# Patient Record
Sex: Female | Born: 1970 | Hispanic: No | Smoking: Former smoker
Health system: Southern US, Community
[De-identification: ages and names within clinical notes are randomized; demographics above are authoritative.]

## PROBLEM LIST (undated history)

## (undated) DIAGNOSIS — IMO0002 Reserved for concepts with insufficient information to code with codable children: Principal | ICD-10-CM

## (undated) DIAGNOSIS — F419 Anxiety disorder, unspecified: Secondary | ICD-10-CM

## (undated) DIAGNOSIS — D649 Anemia, unspecified: Secondary | ICD-10-CM

## (undated) DIAGNOSIS — M797 Fibromyalgia: Secondary | ICD-10-CM

## (undated) DIAGNOSIS — E039 Hypothyroidism, unspecified: Secondary | ICD-10-CM

## (undated) DIAGNOSIS — K589 Irritable bowel syndrome without diarrhea: Secondary | ICD-10-CM

## (undated) DIAGNOSIS — R002 Palpitations: Secondary | ICD-10-CM

## (undated) DIAGNOSIS — R7303 Prediabetes: Secondary | ICD-10-CM

## (undated) DIAGNOSIS — M1990 Unspecified osteoarthritis, unspecified site: Secondary | ICD-10-CM

## (undated) DIAGNOSIS — J40 Bronchitis, not specified as acute or chronic: Secondary | ICD-10-CM

## (undated) DIAGNOSIS — I10 Essential (primary) hypertension: Secondary | ICD-10-CM

## (undated) DIAGNOSIS — K219 Gastro-esophageal reflux disease without esophagitis: Secondary | ICD-10-CM

## (undated) DIAGNOSIS — F32A Depression, unspecified: Secondary | ICD-10-CM

## (undated) DIAGNOSIS — G8929 Other chronic pain: Secondary | ICD-10-CM

## (undated) DIAGNOSIS — G43909 Migraine, unspecified, not intractable, without status migrainosus: Secondary | ICD-10-CM

## (undated) DIAGNOSIS — G4730 Sleep apnea, unspecified: Secondary | ICD-10-CM

## (undated) DIAGNOSIS — I499 Cardiac arrhythmia, unspecified: Secondary | ICD-10-CM

## (undated) DIAGNOSIS — M549 Dorsalgia, unspecified: Secondary | ICD-10-CM

## (undated) DIAGNOSIS — M792 Neuralgia and neuritis, unspecified: Secondary | ICD-10-CM

## (undated) HISTORY — DX: Reserved for concepts with insufficient information to code with codable children: IMO0002

## (undated) HISTORY — PX: DILATION AND CURETTAGE OF UTERUS: SHX78

## (undated) HISTORY — PX: OTHER SURGICAL HISTORY: SHX169

## (undated) HISTORY — DX: Palpitations: R00.2

## (undated) HISTORY — PX: ENDOMETRIAL ABLATION: SHX621

---

## 1991-08-05 HISTORY — PX: TUBAL LIGATION: SHX77

## 2007-05-29 ENCOUNTER — Emergency Department (HOSPITAL_COMMUNITY): Admission: EM | Admit: 2007-05-29 | Discharge: 2007-05-29 | Payer: Self-pay | Admitting: Emergency Medicine

## 2009-01-24 ENCOUNTER — Emergency Department (HOSPITAL_COMMUNITY): Admission: EM | Admit: 2009-01-24 | Discharge: 2009-01-24 | Payer: Self-pay | Admitting: Emergency Medicine

## 2009-04-10 ENCOUNTER — Emergency Department (HOSPITAL_COMMUNITY): Admission: EM | Admit: 2009-04-10 | Discharge: 2009-04-10 | Payer: Self-pay | Admitting: Emergency Medicine

## 2009-05-05 ENCOUNTER — Emergency Department (HOSPITAL_COMMUNITY): Admission: EM | Admit: 2009-05-05 | Discharge: 2009-05-05 | Payer: Self-pay | Admitting: Emergency Medicine

## 2009-10-26 ENCOUNTER — Emergency Department (HOSPITAL_COMMUNITY): Admission: EM | Admit: 2009-10-26 | Discharge: 2009-10-27 | Payer: Self-pay | Admitting: Emergency Medicine

## 2010-05-09 ENCOUNTER — Encounter: Admission: RE | Admit: 2010-05-09 | Discharge: 2010-05-09 | Payer: Self-pay | Admitting: Family Medicine

## 2010-06-05 ENCOUNTER — Other Ambulatory Visit
Admission: RE | Admit: 2010-06-05 | Discharge: 2010-06-05 | Payer: Self-pay | Source: Home / Self Care | Admitting: Obstetrics & Gynecology

## 2010-06-05 ENCOUNTER — Ambulatory Visit: Payer: Self-pay | Admitting: Obstetrics and Gynecology

## 2010-07-04 ENCOUNTER — Ambulatory Visit: Payer: Self-pay | Admitting: Obstetrics and Gynecology

## 2010-08-24 ENCOUNTER — Encounter: Payer: Self-pay | Admitting: Internal Medicine

## 2010-09-26 ENCOUNTER — Other Ambulatory Visit (HOSPITAL_COMMUNITY): Payer: Self-pay | Admitting: Internal Medicine

## 2010-09-26 ENCOUNTER — Ambulatory Visit (HOSPITAL_COMMUNITY)
Admission: RE | Admit: 2010-09-26 | Discharge: 2010-09-26 | Disposition: A | Discharge status: Home / Self Care | Payer: Self-pay | Source: Ambulatory Visit | Attending: Internal Medicine | Admitting: Internal Medicine

## 2010-09-26 DIAGNOSIS — M79609 Pain in unspecified limb: Secondary | ICD-10-CM | POA: Insufficient documentation

## 2010-09-26 DIAGNOSIS — M79673 Pain in unspecified foot: Secondary | ICD-10-CM

## 2010-10-15 LAB — POCT PREGNANCY, URINE: Preg Test, Ur: NEGATIVE

## 2010-10-16 ENCOUNTER — Other Ambulatory Visit (HOSPITAL_COMMUNITY)
Admission: RE | Admit: 2010-10-16 | Discharge: 2010-10-16 | Disposition: A | Discharge status: Home / Self Care | Payer: Self-pay | Source: Ambulatory Visit | Attending: Family Medicine | Admitting: Family Medicine

## 2010-10-16 ENCOUNTER — Other Ambulatory Visit: Payer: Self-pay | Admitting: Physician Assistant

## 2010-10-16 ENCOUNTER — Encounter: Payer: Self-pay | Admitting: Physician Assistant

## 2010-10-16 ENCOUNTER — Other Ambulatory Visit: Payer: Self-pay | Admitting: Family Medicine

## 2010-10-16 ENCOUNTER — Ambulatory Visit (INDEPENDENT_AMBULATORY_CARE_PROVIDER_SITE_OTHER): Payer: Self-pay | Admitting: Family Medicine

## 2010-10-16 DIAGNOSIS — N938 Other specified abnormal uterine and vaginal bleeding: Secondary | ICD-10-CM

## 2010-10-16 DIAGNOSIS — R58 Hemorrhage, not elsewhere classified: Secondary | ICD-10-CM

## 2010-10-16 DIAGNOSIS — R102 Pelvic and perineal pain: Secondary | ICD-10-CM

## 2010-10-16 DIAGNOSIS — D219 Benign neoplasm of connective and other soft tissue, unspecified: Secondary | ICD-10-CM

## 2010-10-16 DIAGNOSIS — N949 Unspecified condition associated with female genital organs and menstrual cycle: Secondary | ICD-10-CM

## 2010-10-16 LAB — CONVERTED CEMR LAB
HCT: 36.4 % (ref 36.0–46.0)
MCHC: 31.3 g/dL (ref 30.0–36.0)
MCV: 80 fL (ref 78.0–100.0)
Platelets: 424 10*3/uL — ABNORMAL HIGH (ref 150–400)
TSH: 3.447 microintl units/mL (ref 0.350–4.500)
Vit D, 25-Hydroxy: 15 ng/mL — ABNORMAL LOW (ref 30–89)

## 2010-10-16 LAB — POCT PREGNANCY, URINE: Preg Test, Ur: NEGATIVE

## 2010-10-22 ENCOUNTER — Other Ambulatory Visit: Payer: Self-pay | Admitting: Physician Assistant

## 2010-10-22 ENCOUNTER — Ambulatory Visit (HOSPITAL_COMMUNITY)
Admission: RE | Admit: 2010-10-22 | Discharge: 2010-10-22 | Disposition: A | Discharge status: Home / Self Care | Payer: Self-pay | Source: Ambulatory Visit | Attending: Physician Assistant | Admitting: Physician Assistant

## 2010-10-22 DIAGNOSIS — N938 Other specified abnormal uterine and vaginal bleeding: Secondary | ICD-10-CM | POA: Insufficient documentation

## 2010-10-22 DIAGNOSIS — R102 Pelvic and perineal pain: Secondary | ICD-10-CM

## 2010-10-22 DIAGNOSIS — D219 Benign neoplasm of connective and other soft tissue, unspecified: Secondary | ICD-10-CM

## 2010-10-22 DIAGNOSIS — D259 Leiomyoma of uterus, unspecified: Secondary | ICD-10-CM | POA: Insufficient documentation

## 2010-10-22 DIAGNOSIS — R58 Hemorrhage, not elsewhere classified: Secondary | ICD-10-CM

## 2010-10-22 DIAGNOSIS — N949 Unspecified condition associated with female genital organs and menstrual cycle: Secondary | ICD-10-CM | POA: Insufficient documentation
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
## 2010-10-28 LAB — BASIC METABOLIC PANEL
BUN: 8 mg/dL (ref 6–23)
CO2: 25 mEq/L (ref 19–32)
Chloride: 106 mEq/L (ref 96–112)
Creatinine, Ser: 0.64 mg/dL (ref 0.4–1.2)
Potassium: 3.8 mEq/L (ref 3.5–5.1)

## 2010-10-28 LAB — CBC
HCT: 33.4 % — ABNORMAL LOW (ref 36.0–46.0)
MCHC: 32.8 g/dL (ref 30.0–36.0)
MCV: 78.3 fL (ref 78.0–100.0)
Platelets: 319 10*3/uL (ref 150–400)
RBC: 4.27 MIL/uL (ref 3.87–5.11)
WBC: 9.6 10*3/uL (ref 4.0–10.5)

## 2010-10-28 LAB — DIFFERENTIAL
Basophils Relative: 1 % (ref 0–1)
Eosinophils Absolute: 0.2 10*3/uL (ref 0.0–0.7)
Eosinophils Relative: 2 % (ref 0–5)
Lymphs Abs: 3.7 10*3/uL (ref 0.7–4.0)
Monocytes Relative: 7 % (ref 3–12)
Neutrophils Relative %: 51 % (ref 43–77)

## 2010-10-30 ENCOUNTER — Other Ambulatory Visit: Payer: Self-pay | Admitting: Family Medicine

## 2010-10-30 ENCOUNTER — Ambulatory Visit (INDEPENDENT_AMBULATORY_CARE_PROVIDER_SITE_OTHER): Payer: Self-pay | Admitting: Family Medicine

## 2010-10-30 DIAGNOSIS — D219 Benign neoplasm of connective and other soft tissue, unspecified: Secondary | ICD-10-CM

## 2010-10-30 DIAGNOSIS — N938 Other specified abnormal uterine and vaginal bleeding: Secondary | ICD-10-CM

## 2010-10-30 DIAGNOSIS — N949 Unspecified condition associated with female genital organs and menstrual cycle: Secondary | ICD-10-CM

## 2010-10-30 DIAGNOSIS — R9389 Abnormal findings on diagnostic imaging of other specified body structures: Secondary | ICD-10-CM

## 2010-10-31 NOTE — Progress Notes (Signed)
NAMEOTTO, CARAWAY               ACCOUNT NO.:  [id number]  MEDICAL RECORD NO.:  [id number]           PATIENT TYPE:  A  LOCATION[facility]                   [facility]ACILITY:  [facility]  PHYSICIAN:  Lucina Mellow, DO   DATE OF BIRTH:  June 27, 1971  DATE OF SERVICE:  10/30/2010                                 CLINIC NOTE  The patient was seen in the [facility] the afternoon of October 30, 2010.  The patient presents for her results of her endometrial biopsy, other labs, and her ultrasound.  HISTORY OF PRESENT ILLNESS:  The patient is a 39-year gravida 4, para 2- 0-1-2 who presented with longstanding complaints of heavy painful periods on October 16, 2010.  The patient was seen by certified nurse midwife, Maylon Cos.  At that time, the patient had endometrial biopsy and was scheduled to have an ultrasound to evaluate her uterus for her complaint of heavy thick periods and pelvic pain, which is worse with cycling.  She states that she had a period that started a couple days after her ultrasound and it was heavy, not heavier than usual, but about approximate how it normally is and states that her pain was about as it was as well.  The patient also had a number of blood work done at the time of her exam with Mr. Dorcas Carrow and she has already been notified that her vitamin D was deficient and I started her 50,000 units weekly vitamin D replacement.  The patient states she has not been taking calcium with that.  She has no acute concerns or complaints today.  She states that she is interested in doing something surgical at this time for her symptoms because she has been struggling with this since the late 1990s after the birth of her youngest child and is ready to do something because the pain and symptoms are just becoming too much.  PAST SURGICAL HISTORY:  Negative.  PERSONAL MEDICAL HISTORY:  Arthritis, high blood pressure, thyroid disease, and hepatitis B.  She is managed by  [facility].  No history of blood transfusions.  Her last Pap was in 2011 and had a colpo that showed LSIL and she is due for repeat Pap in June of this year. She has no known history of sexually transmitted diseases.  LAB RESULTS:  Her endometrial biopsy showed benign secretory endometrium with no hyperplasia or malignancy identified.  Her TSH was 3.447, FSH was 4.5, LH is 3.0, vitamin D was 15, GC and Chlamydia were negative. Her CBC showed hemoglobin of 11.4 and platelets of 424.  Her ultrasound showed an endometrial stripe was heterogeneous and thickened and measured approximately 20 mm in width with a posterior myometrial fibroids that measured 1.7 cm.  Both ovaries were normal in size and appearance.  ASSESSMENT:  Pelvic plan, dysfunction uterine bleeding, and vitamin D deficiency.  PLAN:  The patient was notified of these results and the recommendation to repeat her ultrasound in 6 to 8 weeks from the time of the exam, which is now 4 to 6 weeks from today.  She will get a repeat ultrasound and then will follow up with one  of our surgeons to discuss what her options are for her treatment of her symptoms, notably pain and heavy menstrual periods.  She currently is not anemic.  For her vitamin D deficiency, we discussed that she is taking the 50,000 units weekly of the vitamin D replacement but is also warranted for her to take a calcium supplement.  We discussed that she can just buy an over-the- counter supplement and take between 1500 and 2000 mg of calcium a day and this can be with or without vitamin D because even with the 50,000 units weekly vitamin D, she is not going to adversely affect that.  The patient voiced understanding of that plan.  Her questions were all answered and she will follow up in the clinic after her ultrasound is performed in 4 to 6 weeks and speak with the surgeon about her surgical options at that point, but otherwise, is to call the clinic  if she has a problem in the interim.          ______________________________ Lucina Mellow, DO    SH/MEDQ  D:  10/30/2010  T:  10/31/2010  Job:  [id number]

## 2010-11-07 LAB — URINE MICROSCOPIC-ADD ON

## 2010-11-07 LAB — URINE CULTURE: Colony Count: >=100000

## 2010-11-07 LAB — CBC
MCHC: 32.4 g/dL (ref 30.0–36.0)
MCV: 79.3 fL (ref 78.0–100.0)
RBC: 4.55 MIL/uL (ref 3.87–5.11)
RDW: 13.7 % (ref 11.5–15.5)

## 2010-11-07 LAB — DIFFERENTIAL
Basophils Absolute: 0 10*3/uL (ref 0.0–0.1)
Basophils Relative: 1 % (ref 0–1)
Eosinophils Absolute: 0.2 10*3/uL (ref 0.0–0.7)
Monocytes Relative: 9 % (ref 3–12)
Neutrophils Relative %: 64 % (ref 43–77)

## 2010-11-07 LAB — URINALYSIS, ROUTINE W REFLEX MICROSCOPIC
Glucose, UA: NEGATIVE mg/dL
Ketones, ur: NEGATIVE mg/dL
Protein, ur: 30 mg/dL — AB
Urobilinogen, UA: 1 mg/dL (ref 0.0–1.0)

## 2010-11-07 LAB — BASIC METABOLIC PANEL
BUN: 6 mg/dL (ref 6–23)
CO2: 25 mEq/L (ref 19–32)
Calcium: 9.2 mg/dL (ref 8.4–10.5)
Chloride: 104 mEq/L (ref 96–112)
Creatinine, Ser: 0.75 mg/dL (ref 0.4–1.2)
GFR calc Af Amer: >60 mL/min (ref >60)
Glucose, Bld: 94 mg/dL (ref 70–99)

## 2010-11-26 ENCOUNTER — Ambulatory Visit (HOSPITAL_COMMUNITY)
Admission: RE | Admit: 2010-11-26 | Discharge: 2010-11-26 | Disposition: A | Discharge status: Home / Self Care | Payer: Self-pay | Source: Ambulatory Visit | Attending: Family Medicine | Admitting: Family Medicine

## 2010-11-26 DIAGNOSIS — D259 Leiomyoma of uterus, unspecified: Secondary | ICD-10-CM | POA: Insufficient documentation

## 2010-11-26 DIAGNOSIS — D219 Benign neoplasm of connective and other soft tissue, unspecified: Secondary | ICD-10-CM

## 2010-11-26 DIAGNOSIS — N920 Excessive and frequent menstruation with regular cycle: Secondary | ICD-10-CM | POA: Insufficient documentation

## 2010-11-26 DIAGNOSIS — R9389 Abnormal findings on diagnostic imaging of other specified body structures: Secondary | ICD-10-CM

## 2010-11-27 ENCOUNTER — Emergency Department (HOSPITAL_COMMUNITY)
Admission: EM | Admit: 2010-11-27 | Discharge: 2010-11-27 | Disposition: A | Discharge status: Home / Self Care | Payer: Self-pay | Attending: Emergency Medicine | Admitting: Emergency Medicine

## 2010-11-27 ENCOUNTER — Emergency Department (HOSPITAL_COMMUNITY): Payer: Self-pay

## 2010-11-27 DIAGNOSIS — M7989 Other specified soft tissue disorders: Secondary | ICD-10-CM | POA: Insufficient documentation

## 2010-11-27 DIAGNOSIS — M79609 Pain in unspecified limb: Secondary | ICD-10-CM | POA: Insufficient documentation

## 2010-11-27 DIAGNOSIS — E039 Hypothyroidism, unspecified: Secondary | ICD-10-CM | POA: Insufficient documentation

## 2010-11-27 DIAGNOSIS — M722 Plantar fascial fibromatosis: Secondary | ICD-10-CM | POA: Insufficient documentation

## 2010-11-27 DIAGNOSIS — Z79899 Other long term (current) drug therapy: Secondary | ICD-10-CM | POA: Insufficient documentation

## 2010-12-04 ENCOUNTER — Ambulatory Visit: Payer: Self-pay | Admitting: Family Medicine

## 2010-12-18 ENCOUNTER — Ambulatory Visit: Payer: Self-pay | Admitting: Physician Assistant

## 2011-01-03 ENCOUNTER — Ambulatory Visit: Payer: Self-pay | Admitting: Obstetrics and Gynecology

## 2011-01-09 ENCOUNTER — Ambulatory Visit (INDEPENDENT_AMBULATORY_CARE_PROVIDER_SITE_OTHER): Payer: Self-pay | Admitting: Obstetrics & Gynecology

## 2011-01-09 ENCOUNTER — Other Ambulatory Visit: Payer: Self-pay | Admitting: Obstetrics & Gynecology

## 2011-01-09 DIAGNOSIS — N938 Other specified abnormal uterine and vaginal bleeding: Secondary | ICD-10-CM

## 2011-01-09 DIAGNOSIS — N949 Unspecified condition associated with female genital organs and menstrual cycle: Secondary | ICD-10-CM

## 2011-01-09 DIAGNOSIS — E669 Obesity, unspecified: Secondary | ICD-10-CM

## 2011-01-09 DIAGNOSIS — R87612 Low grade squamous intraepithelial lesion on cytologic smear of cervix (LGSIL): Secondary | ICD-10-CM

## 2011-01-10 NOTE — Group Therapy Note (Signed)
Gloria Hall, RANN NO.:  [id number]  MEDICAL RECORD NO.:  [id number]           PATIENT TYPE:  A  LOCATION[facility]                   [facility]ACILITY[facility]  [facility]  PHYSICIAN:  Allie Bossier, MD        DATE OF BIRTH:  April 17, 1971  DATE OF SERVICE:                                 CLINIC NOTE  IDENTIFICATION:  Gloria Hall is a 40 year old married, G4, P3, A1.  She has 70 year old son and 75 and 3 year old daughters.  She comes here after seeing Dr. Natale Milch in March 2012.  At that time, she had been complaining of heavy periods but have been longstanding.  However, in last 3-6 months, she says that she has been having some intermenstrual bleeding as well as pelvic pain.  Pelvic pain is worse with her cycles but she does have pain with intercourse and she has occasional pains in her pelvis unrelated to her period.  PAST MEDICAL HISTORY:  Significant for history of hypertension, but she no longer take medicines.  She has also a history of dysfunctional uterine bleeding, pelvic pain, obesity, vitamin D deficiency, history of a low-grade Pap smear done in September 2011.  PAST SURGICAL HISTORY:  She has had a tubal ligation laparoscopically.  No latex allergies.  No drug allergies.  MEDICATIONS:  She uses Tylenol, ibuprofen, and naproxen p.r.n.  She also takes alprazolam 1 mg b.i.d. p.r.n.  Endometrial biopsy done this year showed benign secretory endometrium. Her TSH was normal.  Her FSH and LH were normal and her CBC showed a hemoglobin to be 11.4.  An ultrasound was essentially normal.  She did have 2 posterior myometrial fibroids, both of which are less than 2 cm in size.  Pelvic exam, she has relatively narrow pelvis, although she did accomplish NSVD x3.  Cervix appears normal on speculum exam.  Her bimanual exam reveals uterus is midplane, somewhat tender with palpation, upper limits of normal for size, and adnexa that are nontender and nonenlarged.  ASSESSMENT AND  PLAN:  Pelvic pain, dysmenorrhea, dyspareunia, and dysfunctional uterine bleeding.  I have offered her Mirena IUD or a NovaSure endometrial ablation with the diagnostic laparoscopy at the same time.  She and her husband are going to review the literature that will be provided and she will call and let us know.  If she chooses to have this surgery, I have already placed the orders in the chart so that they can be turned in so her surgery can be done in a timely fashion.     Allie Bossier, MD    MCD/MEDQ  D:  01/09/2011  T:  01/10/2011  Job:  [id number]

## 2011-03-05 ENCOUNTER — Emergency Department (HOSPITAL_COMMUNITY)
Admission: EM | Admit: 2011-03-05 | Discharge: 2011-03-06 | Disposition: A | Discharge status: Home / Self Care | Payer: Self-pay | Attending: Emergency Medicine | Admitting: Emergency Medicine

## 2011-03-05 ENCOUNTER — Emergency Department (HOSPITAL_COMMUNITY)
Admission: EM | Admit: 2011-03-05 | Discharge: 2011-03-05 | Disposition: A | Discharge status: Home / Self Care | Payer: Self-pay | Attending: Emergency Medicine | Admitting: Emergency Medicine

## 2011-03-05 DIAGNOSIS — E039 Hypothyroidism, unspecified: Secondary | ICD-10-CM | POA: Insufficient documentation

## 2011-03-05 DIAGNOSIS — K029 Dental caries, unspecified: Secondary | ICD-10-CM | POA: Insufficient documentation

## 2011-03-05 DIAGNOSIS — K089 Disorder of teeth and supporting structures, unspecified: Secondary | ICD-10-CM | POA: Insufficient documentation

## 2011-03-05 DIAGNOSIS — K047 Periapical abscess without sinus: Secondary | ICD-10-CM | POA: Insufficient documentation

## 2011-03-05 DIAGNOSIS — M25569 Pain in unspecified knee: Secondary | ICD-10-CM | POA: Insufficient documentation

## 2011-03-10 ENCOUNTER — Other Ambulatory Visit (HOSPITAL_COMMUNITY): Payer: Self-pay

## 2011-03-11 ENCOUNTER — Encounter (HOSPITAL_COMMUNITY): Payer: Self-pay

## 2011-03-11 ENCOUNTER — Encounter (HOSPITAL_COMMUNITY)
Admission: RE | Admit: 2011-03-11 | Discharge: 2011-03-11 | Disposition: A | Discharge status: Home / Self Care | Payer: Self-pay | Source: Ambulatory Visit | Attending: Obstetrics & Gynecology | Admitting: Obstetrics & Gynecology

## 2011-03-11 HISTORY — DX: Anxiety disorder, unspecified: F41.9

## 2011-03-11 HISTORY — DX: Anemia, unspecified: D64.9

## 2011-03-11 HISTORY — DX: Essential (primary) hypertension: I10

## 2011-03-11 LAB — CBC
HCT: 34.1 % — ABNORMAL LOW (ref 36.0–46.0)
MCV: 78.8 fL (ref 78.0–100.0)
RBC: 4.33 MIL/uL (ref 3.87–5.11)
RDW: 14.6 % (ref 11.5–15.5)
WBC: 11.6 10*3/uL — ABNORMAL HIGH (ref 4.0–10.5)

## 2011-03-11 NOTE — Patient Instructions (Addendum)
19 Gloria Hall  03/11/2011   Your procedure is scheduled on:  03/19/11  Report to [facility] at 1130 AM.  Call this number if you have problems the morning of surgery: [phone]   Remember:   Do not eat food:After Midnight.  Do not drink clear liquids: 4 Hours before arrival.  Take these medicines the morning of surgery with A SIP OF WATER: NA   Do not wear jewelry, make-up or nail polish.  Do not wear lotions, powders, or perfumes. You may wear deodorant.  Do not shave 48 hours prior to surgery.  Do not bring valuables to the hospital.  Contacts, dentures or bridgework may not be worn into surgery.  Leave suitcase in the car. After surgery it may be brought to your room.  For patients admitted to the hospital, checkout time is 11:00 AM the day of discharge.   Patients discharged the day of surgery will not be allowed to drive home.  Name and phone number of your driver: husband  [name]  [phone]  Special Instructions: CHG wash per written instruction sheet   Please read over the following fact sheets that you were given: MRSA Information

## 2011-03-11 NOTE — Pre-Procedure Instructions (Signed)
Patient to have dental extractions x5 on 03/13/11 due to toothache. On oral PCN at present.

## 2011-03-19 ENCOUNTER — Encounter (HOSPITAL_COMMUNITY): Payer: Self-pay | Admitting: Anesthesiology

## 2011-03-19 ENCOUNTER — Other Ambulatory Visit: Payer: Self-pay | Admitting: Obstetrics & Gynecology

## 2011-03-19 ENCOUNTER — Ambulatory Visit (HOSPITAL_COMMUNITY)
Admission: RE | Admit: 2011-03-19 | Discharge: 2011-03-19 | Disposition: A | Discharge status: Home / Self Care | Payer: Self-pay | Source: Ambulatory Visit | Attending: Obstetrics & Gynecology | Admitting: Obstetrics & Gynecology

## 2011-03-19 ENCOUNTER — Encounter (HOSPITAL_COMMUNITY)
Admission: RE | Disposition: A | Discharge status: Home / Self Care | Payer: Self-pay | Source: Ambulatory Visit | Attending: Obstetrics & Gynecology

## 2011-03-19 ENCOUNTER — Ambulatory Visit (HOSPITAL_COMMUNITY): Payer: Self-pay | Admitting: Anesthesiology

## 2011-03-19 ENCOUNTER — Encounter (HOSPITAL_COMMUNITY): Payer: Self-pay | Admitting: *Deleted

## 2011-03-19 ENCOUNTER — Encounter (HOSPITAL_COMMUNITY): Payer: Self-pay | Admitting: Obstetrics & Gynecology

## 2011-03-19 DIAGNOSIS — Z01812 Encounter for preprocedural laboratory examination: Secondary | ICD-10-CM | POA: Insufficient documentation

## 2011-03-19 DIAGNOSIS — N80109 Endometriosis of ovary, unspecified side, unspecified depth: Secondary | ICD-10-CM | POA: Insufficient documentation

## 2011-03-19 DIAGNOSIS — N801 Endometriosis of ovary: Secondary | ICD-10-CM

## 2011-03-19 DIAGNOSIS — N938 Other specified abnormal uterine and vaginal bleeding: Secondary | ICD-10-CM | POA: Insufficient documentation

## 2011-03-19 DIAGNOSIS — N946 Dysmenorrhea, unspecified: Secondary | ICD-10-CM

## 2011-03-19 DIAGNOSIS — Z01818 Encounter for other preprocedural examination: Secondary | ICD-10-CM | POA: Insufficient documentation

## 2011-03-19 DIAGNOSIS — N803 Endometriosis of pelvic peritoneum, unspecified: Secondary | ICD-10-CM | POA: Insufficient documentation

## 2011-03-19 DIAGNOSIS — N949 Unspecified condition associated with female genital organs and menstrual cycle: Secondary | ICD-10-CM

## 2011-03-19 DIAGNOSIS — N831 Corpus luteum cyst of ovary, unspecified side: Secondary | ICD-10-CM | POA: Insufficient documentation

## 2011-03-19 HISTORY — PX: LAPAROSCOPY: SHX197

## 2011-03-19 HISTORY — PX: NOVASURE ABLATION: SHX5394

## 2011-03-19 LAB — PREGNANCY, URINE: Preg Test, Ur: NEGATIVE

## 2011-03-19 SURGERY — LAPAROSCOPY OPERATIVE
Anesthesia: General | Wound class: Clean Contaminated

## 2011-03-19 MED ORDER — KETOROLAC TROMETHAMINE 30 MG/ML IJ SOLN
INTRAMUSCULAR | Status: DC | PRN
  Administered 2011-03-19: 30 mg via INTRAVENOUS

## 2011-03-19 MED ORDER — ONDANSETRON HCL 4 MG/2ML IJ SOLN
INTRAMUSCULAR | Status: DC | PRN
  Administered 2011-03-19: 4 mg via INTRAVENOUS

## 2011-03-19 MED ORDER — DEXAMETHASONE SODIUM PHOSPHATE 4 MG/ML IJ SOLN
INTRAMUSCULAR | Status: DC | PRN
  Administered 2011-03-19: 6 mg via INTRAVENOUS

## 2011-03-19 MED ORDER — DOXYCYCLINE HYCLATE 100 MG IV SOLR
100.0000 mg | Freq: Two times a day (BID) | INTRAVENOUS | Status: DC
  Filled 2011-03-19 (×3): qty 100

## 2011-03-19 MED ORDER — LIDOCAINE HCL (CARDIAC) 20 MG/ML IV SOLN
INTRAVENOUS | Status: DC | PRN
  Administered 2011-03-19: 60 mg via INTRAVENOUS

## 2011-03-19 MED ORDER — FENTANYL CITRATE 0.05 MG/ML IJ SOLN
INTRAMUSCULAR | Status: DC | PRN
  Administered 2011-03-19: 25 ug via INTRAVENOUS
  Administered 2011-03-19: 100 ug via INTRAVENOUS

## 2011-03-19 MED ORDER — ROCURONIUM BROMIDE 50 MG/5ML IV SOLN
INTRAVENOUS | Status: AC
  Filled 2011-03-19: qty 1

## 2011-03-19 MED ORDER — MIDAZOLAM HCL 5 MG/5ML IJ SOLN
INTRAMUSCULAR | Status: DC | PRN
  Administered 2011-03-19: 2 mg via INTRAVENOUS

## 2011-03-19 MED ORDER — SUCCINYLCHOLINE CHLORIDE 20 MG/ML IJ SOLN
INTRAMUSCULAR | Status: DC | PRN
  Administered 2011-03-19: 100 mg via INTRAVENOUS

## 2011-03-19 MED ORDER — METOCLOPRAMIDE HCL 5 MG/ML IJ SOLN: 10.0000 mg | Freq: Once | INTRAMUSCULAR | Status: DC | PRN

## 2011-03-19 MED ORDER — FENTANYL CITRATE 0.05 MG/ML IJ SOLN
INTRAMUSCULAR | Status: AC
  Administered 2011-03-19: 25 ug via INTRAVENOUS
  Filled 2011-03-19: qty 2

## 2011-03-19 MED ORDER — PROPOFOL 10 MG/ML IV EMUL
INTRAVENOUS | Status: DC | PRN
  Administered 2011-03-19: 200 mg via INTRAVENOUS

## 2011-03-19 MED ORDER — BUPIVACAINE HCL (PF) 0.5 % IJ SOLN
INTRAMUSCULAR | Status: DC | PRN
  Administered 2011-03-19: 30 mL

## 2011-03-19 MED ORDER — LACTATED RINGERS IV SOLN
INTRAVENOUS | Status: DC
  Administered 2011-03-19: 1000 mL via INTRAVENOUS
  Administered 2011-03-19: 12:00:00 via INTRAVENOUS

## 2011-03-19 MED ORDER — FENTANYL CITRATE 0.05 MG/ML IJ SOLN
25.0000 ug | INTRAMUSCULAR | Status: DC | PRN
  Administered 2011-03-19: 25 ug via INTRAVENOUS
  Administered 2011-03-19: 50 ug via INTRAVENOUS
  Administered 2011-03-19: 25 ug via INTRAVENOUS

## 2011-03-19 MED ORDER — DOXYCYCLINE HYCLATE 100 MG IV SOLR
100.0000 mg | INTRAVENOUS | Status: DC | PRN
  Administered 2011-03-19: 100 mg via INTRAVENOUS

## 2011-03-19 MED ORDER — FENTANYL CITRATE 0.05 MG/ML IJ SOLN
INTRAMUSCULAR | Status: AC
  Filled 2011-03-19: qty 5

## 2011-03-19 MED ORDER — DEXAMETHASONE SODIUM PHOSPHATE 10 MG/ML IJ SOLN
INTRAMUSCULAR | Status: AC
  Filled 2011-03-19: qty 1

## 2011-03-19 MED ORDER — MIDAZOLAM HCL 2 MG/2ML IJ SOLN
INTRAMUSCULAR | Status: AC
  Filled 2011-03-19: qty 2

## 2011-03-19 MED ORDER — LIDOCAINE HCL (CARDIAC) 20 MG/ML IV SOLN
INTRAVENOUS | Status: AC
  Filled 2011-03-19: qty 5

## 2011-03-19 MED ORDER — PROPOFOL 10 MG/ML IV EMUL
INTRAVENOUS | Status: AC
  Filled 2011-03-19: qty 20

## 2011-03-19 MED ORDER — SUCCINYLCHOLINE CHLORIDE 20 MG/ML IJ SOLN
INTRAMUSCULAR | Status: AC
  Filled 2011-03-19: qty 1

## 2011-03-19 MED ORDER — ONDANSETRON HCL 4 MG/2ML IJ SOLN
INTRAMUSCULAR | Status: AC
  Filled 2011-03-19: qty 2

## 2011-03-19 MED ORDER — KETOROLAC TROMETHAMINE 30 MG/ML IJ SOLN
INTRAMUSCULAR | Status: AC
  Filled 2011-03-19: qty 1

## 2011-03-19 SURGICAL SUPPLY — 33 items
ABLATOR ENDOMETRIAL BIPOLAR (ABLATOR) ×2 IMPLANT
APPLICATOR COTTON TIP 6IN STRL (MISCELLANEOUS) ×2 IMPLANT
BENZOIN TINCTURE PRP APPL 2/3 (GAUZE/BANDAGES/DRESSINGS) ×2 IMPLANT
CABLE HIGH FREQUENCY MONO STRZ (ELECTRODE) IMPLANT
CATH ROBINSON RED A/P 16FR (CATHETERS) ×2 IMPLANT
CHLORAPREP W/TINT 26ML (MISCELLANEOUS) ×2 IMPLANT
CLOTH BEACON ORANGE TIMEOUT ST (SAFETY) ×2 IMPLANT
DRAPE UTILITY XL STRL (DRAPES) ×2 IMPLANT
DRESSING TELFA 8X3 (GAUZE/BANDAGES/DRESSINGS) ×2 IMPLANT
ELECT REM PT RETURN 9FT ADLT (ELECTROSURGICAL)
ELECTRODE REM PT RTRN 9FT ADLT (ELECTROSURGICAL) IMPLANT
FORCEPS CUTTING 33CM 5MM (CUTTING FORCEPS) IMPLANT
FORCEPS CUTTING 45CM 5MM (CUTTING FORCEPS) IMPLANT
GLOVE BIO SURGEON STRL SZ 6.5 (GLOVE) ×4 IMPLANT
GOWN PREVENTION PLUS LG XLONG (DISPOSABLE) ×4 IMPLANT
NDL SAFETY ECLIPSE 18X1.5 (NEEDLE) ×1 IMPLANT
NEEDLE HYPO 18GX1.5 SHARP (NEEDLE) ×1
NEEDLE INSUFFLATION 14GA 120MM (NEEDLE) ×2 IMPLANT
NEEDLE SPNL 22GX3.5 QUINCKE BK (NEEDLE) ×2 IMPLANT
NS IRRIG 1000ML POUR BTL (IV SOLUTION) ×2 IMPLANT
PACK HYSTEROSCOPY LF (CUSTOM PROCEDURE TRAY) ×2 IMPLANT
PACK LAPAROSCOPY BASIN (CUSTOM PROCEDURE TRAY) ×2 IMPLANT
POUCH SPECIMEN RETRIEVAL 10MM (ENDOMECHANICALS) IMPLANT
SET IRRIG TUBING LAPAROSCOPIC (IRRIGATION / IRRIGATOR) IMPLANT
STRIP CLOSURE SKIN 1/4X4 (GAUZE/BANDAGES/DRESSINGS) ×2 IMPLANT
SUT VICRYL 0 ENDOLOOP (SUTURE) IMPLANT
SUT VICRYL 0 UR6 27IN ABS (SUTURE) ×4 IMPLANT
SUT VICRYL 4-0 PS2 18IN ABS (SUTURE) ×4 IMPLANT
SYR CONTROL 10ML LL (SYRINGE) ×2 IMPLANT
TOWEL OR 17X24 6PK STRL BLUE (TOWEL DISPOSABLE) ×4 IMPLANT
TROCAR XCEL NON-BLD 11X100MML (ENDOMECHANICALS) IMPLANT
TROCAR XCEL NON-BLD 5MMX100MML (ENDOMECHANICALS) IMPLANT
WATER STERILE IRR 1000ML POUR (IV SOLUTION) ×2 IMPLANT

## 2011-03-19 NOTE — Anesthesia Postprocedure Evaluation (Signed)
  Anesthesia Post-op Note  Patient: Gloria Hall  Procedure(s) Performed:  LAPAROSCOPY OPERATIVE; NOVASURE ABLATION  Patient Location: PACU  Anesthesia Type: General  Level of Consciousness: awake, alert  and oriented  Airway and Oxygen Therapy: Patient Spontanous Breathing  Post-op Pain: none  Post-op Assessment: Post-op Vital signs reviewed, Patient's Cardiovascular Status Stable, Respiratory Function Stable, Patent Airway, No signs of Nausea or vomiting and Pain level controlled  Post-op Vital Signs: Reviewed and stable  Complications: No apparent anesthesia complications

## 2011-03-19 NOTE — Op Note (Signed)
03/19/2011  2:07 PM  PATIENT:  Gloria Hall  40 y.o. female  PRE-OPERATIVE DIAGNOSIS:  chronic pelvic pain, dysfunctional uterine bleeding  POST-OPERATIVE DIAGNOSIS:  chronic pelvic pain, dysfunctional uterine bleeding + Stage 2 endometriosis  PROCEDURE:  Procedure(s): LAPAROSCOPY DIAGNOSTIC NOVASURE ABLATION BIOPSIES OF ENDOMETRIOSIS  SURGEON:  Surgeon(s): Suede Greenawalt C. Marice Potter, MD  PHYSICIAN ASSISTANT:   ASSISTANTS: none   ANESTHESIA:   general  ESTIMATED BLOOD LOSS: * No blood loss amount entered *   BLOOD ADMINISTERED:none  DRAINS: none   LOCAL MEDICATIONS USED:  MARCAINE 30 CC  SPECIMEN:  Source of Specimen:  Right posterior cul de sac and left fundus  DISPOSITION OF SPECIMEN:  PATHOLOGY  COUNTS:  YES  TOURNIQUET:  none  DICTATION #:   PLAN OF CARE: discharge home  PATIENT DISPOSITION:  PACU - hemodynamically stable.   Delay start of Pharmacological VTE agent (>24hrs) due to surgical blood loss or risk of bleeding:  no   The risks, benefits, and alternatives of surgery were explained, understood, and accepted. Consents were signed and all questions were answered. She tells me she has Vicodin and ibuprofen at home to be used for postoperative pain. In the operating room general anesthesia was applied without complication and she was placed in the dorsal lithotomy position. Her abdomen and vagina were prepped and draped in the usual sterile fashion. A bimanual exam revealed a mobile 6 weeks size anteverted uterus and nonenlarged adnexa. Her bladder was emptied with a Robinson catheter for approximately 50 mL of clear urine. Gloves were changed and attention was turned to the abdomen. 15 cc of 0.5% Marcaine was injected into the umbilical subcutaneous tissue. An incision was made here approximately 1 cm in length and a varies needle was placed intraperitoneally. Low-flow CO2 was used to insufflate the abdomen to approximately 4 L. A good pneumoperitoneum was established and  a 10 mm XL trocar was placed. Laparoscopy confirmed correct placement and she was placed in Trendelenburg position. 5 mL of 0.5% Marcaine was injected into the left lower quadrant and and small incision was made. A 5 mm port was placed under direct laparoscopic visualization. The pelvis was inspected and an alpha manner. Both adnexa revealed evidence of a tubal ligation her right ovary had a small corpus luteum cyst seen there was endometriosis in both ovarian fossa as in the posterior cul-de-sac and there were blebs on the uterus there were suspicious for endometriosis as well upon evaluation of her upper abdomen, her gallbladder appeared significantly enlarged. No other abnormalities were noted. A biopsy of the endometriosis in the right posterior cul-de-sac was obtained hemostasis was noted a biopsy was then obtained from the left fundus, just beneath the cornual region of the oviduct. Hemostasis was achieved here with a monopolar cautery. I made no attempt to remove any more endometriosis because of its widespread nature. At this point I removed the 5 mm port from the left lower quadrant. The CO2 was allowed to escape from the abdomen the 10 mm port was removed. 4-0 Vicryl suture was used to close the subcutaneous tissue of both incisions. I then proceeded with the NovaSure ablation. The uterus was sounded to 10 cm the cervical length measured 4 cm and the uterine cavity measured 6 cm. The cervix was dilated to a 105 [place] to accommodate the NovaSure apparatus. The NovaSure apparatus was placed and the cavity width was noted to be 4.7 cm. It passed its test. The procedure then ran for 1 minute and 30 seconds. The  tenaculum was removed and and the site was noted to be hemostatic. I did a paracervical block using 15 mL of 0.5% Marcaine prior to the NovaSure procedure. The instruments, sponges, and needle counts were correct. She was extubated and taken to the recovery room in stable  condition.

## 2011-03-19 NOTE — H&P (Signed)
H & P  [name] is a 40 year old married African American gravida 4 para 3 abortus 1. She has a long history of dysmenorrhea, which began at menarche at approximately 40 years of age. Her periods have always been "heavy". But in the last year periods have become much more frequent, sometimes happening twice a month. Her pain she takes Vicodin as well as ibuprofen. An ultrasound done April 2012 showed 2 small fibroids and no other abnormalities. I have offered her a laparoscopy in addition to a NovaSure endometrial ablation she would like to proceed with these. I did an endometrial biopsy which was negative.  PMH: Anxiety, depression, obesity, LGSIL paps  Past surgical history: A D&C after a miscarriage and a tubal ligation postpartum  Medicine: Vicodin and ibuprofen when necessary  No known drug allergies  Social history: she denies tobacco alcohol or drugs.  Review of systems: She been monogamous for the last 4-1/2 years, and married for the last year the remainder of her review of systems questions were negative. Her most recent pap was 6/12 and mammogram 10/11.  Physical exam:  HEENT: Normal  Heart: Regular rate and rhythm without murmur rubs or gallops  Lungs: Clear to auscultation bilaterally  Abdomen: Moderately obese, nontender  External genitalia: Normal Vagina: Normal Cervix: Normal Uterus: 8 weeks size, anteverted nontender Adnexa: Nontender and no masses  Assessment and plan: #1. Severe dysmenorrhea. I will do a diagnostic laparoscopy and look for endometriosis or other causes. #2. Dysfunctional uterine bleeding. I will do a NovaSure endometrial ablation.  If her symptoms are not resolved with these surgeries and I will plan on a hysterectomy in the future. Risks of surgery have been explained, understood, and accepted.

## 2011-03-19 NOTE — Anesthesia Preprocedure Evaluation (Addendum)
Anesthesia Evaluation  Name, MR# and DOB Patient awake  General Assessment Comment  Reviewed: Allergy & Precautions, H&P , NPO status , Patient's Chart, lab work & pertinent test results  Airway Mallampati: III TM Distance: >3 FB Neck ROM: Full    Dental No notable dental hx. (+) Teeth Intact   Pulmonary  clear to auscultation  pulmonary exam normalPulmonary Exam Normal breath sounds clear to auscultation none    Cardiovascular Regular Normal    Neuro/Psych Negative Neurological ROS  Negative Psych ROS  GI/Hepatic/Renal negative GI ROS, negative Renal ROS (+)       Endo/Other  Negative Endocrine ROS (+)      Abdominal   Musculoskeletal negative musculoskeletal ROS (+)   Hematology negative hematology ROS (+)   Peds  Reproductive/Obstetrics negative OB ROS    Anesthesia Other Findings             Anesthesia Physical Anesthesia Plan  ASA: II  Anesthesia Plan: General   Post-op Pain Management:    Induction: Intravenous  Airway Management Planned: Oral ETT  Additional Equipment:   Intra-op Plan:   Post-operative Plan: Extubation in OR  Informed Consent: I have reviewed the patients History and Physical, chart, labs and discussed the procedure including the risks, benefits and alternatives for the proposed anesthesia with the patient or authorized representative who has indicated his/her understanding and acceptance.   Dental Advisory Given and Dental advisory given  Plan Discussed with: Anesthesiologist  Anesthesia Plan Comments:         Anesthesia Quick Evaluation

## 2011-03-19 NOTE — Transfer of Care (Signed)
Immediate Anesthesia Transfer of Care Note  Patient: Gloria Hall  Procedure(s) Performed:  LAPAROSCOPY OPERATIVE; NOVASURE ABLATION  Patient Location: PACU  Anesthesia Type: General  Level of Consciousness: awake and oriented  Airway & Oxygen Therapy: Patient Spontanous Breathing and Patient connected to nasal cannula oxygen  Post-op Assessment: Report given to PACU RN and Post -op Vital signs reviewed and stable  Post vital signs: Reviewed and stable  Complications: No apparent anesthesia complications

## 2011-03-31 ENCOUNTER — Telehealth: Payer: Self-pay | Admitting: *Deleted

## 2011-04-03 NOTE — Telephone Encounter (Signed)
PT HAS BEEN NOTIFIED.

## 2011-04-03 NOTE — Telephone Encounter (Signed)
Patient has been scheduled for appointment with Dr. Marice Potter on 04/10/11 at 3:30 pm.  Please notify patient.

## 2011-04-10 ENCOUNTER — Encounter: Payer: Self-pay | Admitting: Obstetrics & Gynecology

## 2011-04-10 ENCOUNTER — Ambulatory Visit (INDEPENDENT_AMBULATORY_CARE_PROVIDER_SITE_OTHER): Payer: Self-pay | Admitting: Obstetrics & Gynecology

## 2011-04-10 VITALS — BP 129/85 | HR 112 | Temp 97.8°F | Ht 63.0 in | Wt 220.0 lb

## 2011-04-10 DIAGNOSIS — N809 Endometriosis, unspecified: Secondary | ICD-10-CM

## 2011-04-10 DIAGNOSIS — Z09 Encounter for follow-up examination after completed treatment for conditions other than malignant neoplasm: Secondary | ICD-10-CM

## 2011-04-10 DIAGNOSIS — Z9889 Other specified postprocedural states: Secondary | ICD-10-CM

## 2011-04-10 MED ORDER — NORGESTIM-ETH ESTRAD TRIPHASIC 0.18/0.215/0.25 MG-35 MCG PO TABS: 1.0000 | ORAL_TABLET | Freq: Every day | ORAL | Status: DC

## 2011-04-10 NOTE — Progress Notes (Signed)
  Subjective:    Patient ID: Gloria Hall, female    DOB: 1970/11/27, 40 y.o.   MRN: [id number]  HPI  Gloria Hall is here for a post op visit. She had a diagnostic laparoscopy 3 weeks ago.  Stage 2 endometriosis was diagnosed by me, but this was based on the thought that one type of lesion she had scattered throughout her pelvis was endometriosis.  The biopsy of that type of lesion came back as benign.  The other type of lesion was endometriosis on biopsy.  She has no complaints.  Review of Systems Pap smear 6/12 was LGSIL- due for repeat pap 12/12    Objective:   Physical Exam  Both incisions healed well      Assessment & Plan:  Endometriosis-doing well post operatively. She will start continuous OCPs for her endometriosis and have her BP taken here in a few weeks. She will return in December for her annual.

## 2011-04-14 ENCOUNTER — Encounter (HOSPITAL_COMMUNITY): Payer: Self-pay | Admitting: Obstetrics & Gynecology

## 2011-05-14 ENCOUNTER — Other Ambulatory Visit: Payer: Self-pay | Admitting: Family Medicine

## 2011-05-14 DIAGNOSIS — Z1231 Encounter for screening mammogram for malignant neoplasm of breast: Secondary | ICD-10-CM

## 2011-05-28 ENCOUNTER — Ambulatory Visit: Payer: Self-pay

## 2011-07-01 ENCOUNTER — Ambulatory Visit
Admission: RE | Admit: 2011-07-01 | Discharge: 2011-07-01 | Disposition: A | Discharge status: Home / Self Care | Payer: Self-pay | Source: Ambulatory Visit | Attending: Family Medicine | Admitting: Family Medicine

## 2011-07-01 DIAGNOSIS — Z1231 Encounter for screening mammogram for malignant neoplasm of breast: Secondary | ICD-10-CM

## 2011-08-21 ENCOUNTER — Encounter: Payer: Self-pay | Admitting: Physician Assistant

## 2011-08-21 ENCOUNTER — Ambulatory Visit (INDEPENDENT_AMBULATORY_CARE_PROVIDER_SITE_OTHER): Payer: Self-pay | Admitting: Physician Assistant

## 2011-08-21 VITALS — BP 115/74 | HR 88 | Temp 98.0°F | Ht 63.0 in | Wt 204.6 lb

## 2011-08-21 DIAGNOSIS — R87612 Low grade squamous intraepithelial lesion on cytologic smear of cervix (LGSIL): Secondary | ICD-10-CM

## 2011-08-21 DIAGNOSIS — IMO0002 Reserved for concepts with insufficient information to code with codable children: Secondary | ICD-10-CM

## 2011-08-21 HISTORY — DX: Reserved for concepts with insufficient information to code with codable children: IMO0002

## 2011-08-21 HISTORY — DX: Low grade squamous intraepithelial lesion on cytologic smear of cervix (LGSIL): R87.612

## 2011-08-21 NOTE — Progress Notes (Signed)
Chief Complaint:  Repeat Pap Smear   Gloria Hall is  41 y.o. [id number].  Patient's last menstrual period was 08/11/2011.. She presents complaining of Repeat Pap Smear  Pt reports abnormal pap and colpo in 6/12 in [facility] requiring q 6 months pap smears.   Review of records is unable to locate colpo note or bx results. Pt is able to describe the colpo procedure in detail accurately. No complaints today. Reports regular monthly light periods. Stopped OCPs and Vitamin D supplement secondary to GI upset.  Reports mammogram in November to be NL.  Obstetrical/Gynecological History: OB History    Grav Para Term Preterm Abortions TAB SAB Ect Mult Living   4 3 3  1  1   3       Past Medical History: Past Medical History  Diagnosis Date  . Hypertension     no meds x 6mos, controlled with exercise and diet  . Anemia   . Anxiety   . 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 Tubal ligation 1993  . Dilation and curretage     after a miscarriage  . Endometrial ablation   . Laparoscopy 03/19/2011    Procedure: LAPAROSCOPY OPERATIVE;  Surgeon: Hollie Salk C. Marice Potter, MD;  Location: [facility];  Service: Gynecology;  Laterality: N/A;  . Novasure ablation 03/19/2011    Procedure: NOVASURE ABLATION;  Surgeon: Hollie Salk C. Marice Potter, MD;  Location: [facility];  Service: Gynecology;  Laterality: N/A;    Family History: Family History  Problem Relation Age of Onset  . Diabetes Mother   . Hypertension Mother   . Hyperlipidemia Mother   . Diabetes Maternal Aunt   . Cancer Maternal Aunt     breast, metastatic cancer throughout body  . Cancer Maternal Uncle     leukemia  . Diabetes Maternal Grandmother   . Cancer Maternal Uncle     colon    Social History: History  Substance Use Topics  . Smoking status: Former Games developer  . Smokeless tobacco: Never Used  . Alcohol Use: No    Allergies: No Known Allergies   (Not  in a hospital admission)  Review of Systems - Negative except what has been reviewed in the HPI  Physical Exam   Blood pressure 115/74, pulse 88, temperature 98 F (36.7 C), temperature source Oral, height 5\' 3"  (1.6 m), weight 204 lb 9.6 oz (92.806 kg), last menstrual period 08/11/2011.  General: General appearance - alert, well appearing, and in no distress, oriented to person, place, and time and overweight Mental status - alert, oriented to person, place, and time, normal mood, behavior, speech, dress, motor activity, and thought processes, affect appropriate to mood Focused Gynecological Exam: normal external genitalia, vulva, vagina, cervix, uterus and adnexa, PAP: Pap smear done today, thin-prep method   Assessment: Patient Active Problem List  Diagnoses  . LSIL (low grade squamous intraepithelial lesion) on Pap smear    Plan: Repeat pap obtained  Pt should FU in 6 months for routine annual exam w/ pap or prn problems  Christana Angelica E. 08/21/2011,3:42 PM

## 2011-08-21 NOTE — Patient Instructions (Signed)
Calcium; Vitamin D chewable tablet  What is this medicine?  CALCIUM; VITAMIN D (KAL see um; VYE ta min D) is a vitamin supplement. It is used to prevent conditions of low calcium and vitamin D.  This medicine may be used for other purposes; ask your health care provider or pharmacist if you have questions.  What should I tell my health care provider before I take this medicine?  They need to know if you have any of these conditions:  -constipation  -dehydration  -heart disease  -high level of calcium or vitamin D in the blood  -high level of phosphate in the blood  -kidney disease  -kidney stones  -liver disease  -parathyroid disease  -sarcoidosis  -stomach ulcer or obstruction  -an unusual or allergic reaction to calcium, vitamin D, tartrazine dye, other medicines, foods, dyes, or preservatives  -pregnant or trying to get pregnant  -breast-feeding  How should I use this medicine?  Take this medicine by mouth. Chew it completely before swallowing. Follow the directions on the prescription label. Take with food or within 1 hour after a meal. Take your medicine at regular intervals. Do not take your medicine more often than directed.  Talk to your pediatrician regarding the use of this medicine in children. While this medicine may be used in children for selected conditions, precautions do apply.  Overdosage: If you think you have taken too much of this medicine contact a poison control center or emergency room at once.  NOTE: This medicine is only for you. Do not share this medicine with others.  What if I miss a dose?  If you miss a dose, take it as soon as you can. If it is almost time for your next dose, take only that dose. Do not take double or extra doses.  What may interact with this medicine?  Do not take this medicine with any of the following medications:  -ammonium chloride  -methenamine  This medicine may also interact with the following medications:  -antibiotics like ciprofloxacin, gatifloxacin,  tetracycline  -captopril  -delavirdine  -diuretics  -gabapentin  -iron supplements  -medicines for fungal infections like ketoconazole and itraconazole  -medicines for seizures like ethotoin and phenytoin  -mineral oil  -mycophenolate  -other vitamins with calcium, vitamin D, or minerals  -quinidine  -rosuvastatin  -sucralfate  -thyroid medicine  This list may not describe all possible interactions. Give your health care provider a list of all the medicines, herbs, non-prescription drugs, or dietary supplements you use. Also tell them if you smoke, drink alcohol, or use illegal drugs. Some items may interact with your medicine.  What should I watch for while using this medicine?  Taking this medicine is not a substitute for a well-balanced diet and exercise. Talk with your doctor or health care provider and follow a healthy lifestyle.  Do not take this medicine with high-fiber foods, large amounts of alcohol, or drinks containing caffeine. Do not take this medicine within 2 hours of any other medicines.  What side effects may I notice from receiving this medicine?  Side effects that you should report to your doctor or health care professional as soon as possible:  -allergic reactions like skin rash, itching or hives, swelling of the face, lips, or tongue  -confusion  -dry mouth  -high blood pressure  -increased hunger or thirst  -increased urination  -irregular heartbeat  -metallic taste  -muscle or bone pain  -pain when urinating  -seizure  -unusually weak or tired  -  keep my medicine? Keep  out of the reach of children. Store at room temperature between 15 and 30 degrees C (59 and 86 degrees F). Protect from light. Keep container tightly closed. Throw away any unused medicine after the expiration date. NOTE: This sheet is a summary. It may not cover all possible information. If you have questions about this medicine, talk to your doctor, pharmacist, or health care provider.  2012, Elsevier/Gold Standard. (11/03/2007 5:57:27 PM)Pap Test A Pap test is a sampling of cells from a woman's cervix. The cervix is the opening between the vagina (birth canal) and the uterus (the bottom part of the womb). The cells are scraped from the cervix during a pelvic exam. These cells are then looked at under a microscope to see if the cells are normal or to see if a cancer is developing or there are changes that suggest a cancer will develop. Cervical dysplasia is a condition in which a woman has abnormal changes in the top layer of cells of her cervix. These changes are an early sign that cervical cancer may develop. Pap tests also look for the human papilloma virus (HPV) because it has 4 types that are responsible for 70% of cervical cancer. Infections can also be found during a Pap test such as bacteria, fungus, protozoa and viruses.  Cervical cancer is harder to treat and less likely to have a good outcome if left untreated. Catching the disease at an early stage leads to a better outcome. Since the Pap test was introduced 60 years ago, deaths from cervical cancer have decreased by 70%. Every woman should keep up to date with Pap tests. RISK FACTORS FOR CERVICAL CANCER INCLUDE:   Becoming sexually active before age 92.   Being the daughter of a woman who took diethylstilbestrol (DES) during pregnancy.   Having a sexual partner who has or has had cancer of the penis.   Having a sexual partner whose past partner had cervical cancer or cervical dysplasia (early cell changes which suggest a cancer may  develop).   Having a weakened immune system. An example would be HIV or other immunodeficiency disorder.   Having had a sexually transmitted infection such as chlamydia, gonorrhea or HPV.   Having had an abnormal Pap or cancer of the vagina or vulva.   Having had more than one sexual partner.   A history of cervical cancer in a woman's sister or mother.   Not using condoms with new sexual partners.   Smoking.  WHO SHOULD HAVE PAP TESTS  A Pap test is done to screen for cervical cancer.   The first Pap test should be done at age 30.   Between ages 79 and 53, Pap tests are repeated every 2 years.   Beginning at age 59, you are advised to have a Pap test every 3 years as long as your past 3 Pap tests have been normal.   Some women have medical problems that increase the chance of getting cervical cancer. Talk to your caregiver about these problems. It is especially important to talk to your caregiver if a new problem develops soon after your last Pap test. In these cases, your caregiver may recommend more frequent screening and Pap tests.   The above recommendations are the same for women who have or have not gotten the vaccine for HPV (Human Papillomavirus).   If you had a hysterectomy for a problem that was not a cancer or a condition that could lead to cancer,  then you no longer need Pap tests. However, even if you no longer need a Pap test, a regular exam is a good idea to make sure no other problems are starting.    If you are between ages 62 and 37, and you have had normal Pap tests going back 10 years, you no longer need Pap tests. However, even if you no longer need a Pap test, a regular exam is a good idea to make sure no other problems are starting.    If you have had past treatment for cervical cancer or a condition that could lead to cancer, you need Pap tests and screening for cancer for at least 20 years after your treatment.   If Pap tests have been discontinued,  risk factors (such as a new sexual partner) need to be re-assessed to determine if screening should be resumed.   Some women may need screenings more often if they are at high risk for cervical cancer.  PREPARATION FOR A PAP TEST A Pap test should be performed during the weeks before the start of menstruation. Women should not douche or have sexual intercourse for 24 hours before the test. No vaginal creams, diaphragms, or tampons should be used for 24 hours before the test. To minimize discomfort, a woman should empty her bladder just before the exam. TAKING THE PAP TEST The caregiver will perform a pelvic exam. A metal or plastic instrument (speculum) is placed in the vagina. This is done before your caregiver does a bimanual exam of your internal female organs. This instrument allows your caregiver to see the inside of the vagina and look at the cervix. A small, sterile brush is used to take a sample of cells from the internal opening of the cervix. A small wooden spatula is used to scrape the outside of the cervix. Neither of these two methods to collect cells will cause you pain. These two scrapings are placed on a glass slide or in a small bottle filled with a special liquid. The cells are looked at later under a microscope in a lab. A specialist will look at these cells and determine if the cells are normal. RESULTS OF YOUR PAP TEST  A healthy Pap test shows no abnormal cells or evidence of inflammation.   The presence of abnormally growing cells on the surface of the cervix may be reported as an abnormal Pap test. Different categories of findings are used to describe your Pap test. Your caregiver will go over the importance of these findings with you. The caregiver will then determine what follow-up is needed or when you should have your next pap test.   If you have had two or more abnormal Pap tests:   You may be asked to have a colposcopy. This is a test in which the cervix is viewed with a  special lighted microscope.   A cervical tissue sample (biopsy) may also be needed. This involves taking a small tissue sample from the cervix. The sample is looked at under a microscope to find the cause of the abnormal cells. Make sure you find out the results of the Pap test. If you have not received the results within two weeks, contact your caregiver's office for the results. Do not assume everything is normal if you have not heard from your caregiver or medical facility. It is important to follow up on all of your test results.  Document Released: 10/11/2002 Document Revised: 04/02/2011 Document Reviewed: 12/13/2007 [facility] Patient Information 2012 [employer]. 

## 2011-10-23 ENCOUNTER — Encounter (HOSPITAL_COMMUNITY): Payer: Self-pay | Admitting: *Deleted

## 2011-10-23 ENCOUNTER — Emergency Department (HOSPITAL_COMMUNITY)
Admission: EM | Admit: 2011-10-23 | Discharge: 2011-10-23 | Discharge status: Against Medical Advice | Payer: Self-pay | Attending: Emergency Medicine | Admitting: Emergency Medicine

## 2011-10-23 ENCOUNTER — Encounter (HOSPITAL_COMMUNITY): Payer: Self-pay | Admitting: Emergency Medicine

## 2011-10-23 ENCOUNTER — Emergency Department (HOSPITAL_COMMUNITY)
Admission: EM | Admit: 2011-10-23 | Discharge: 2011-10-24 | Disposition: A | Discharge status: Home / Self Care | Payer: Self-pay | Attending: Emergency Medicine | Admitting: Emergency Medicine

## 2011-10-23 DIAGNOSIS — R000 Tachycardia, unspecified: Secondary | ICD-10-CM | POA: Insufficient documentation

## 2011-10-23 DIAGNOSIS — R51 Headache: Secondary | ICD-10-CM | POA: Insufficient documentation

## 2011-10-23 DIAGNOSIS — Z862 Personal history of diseases of the blood and blood-forming organs and certain disorders involving the immune mechanism: Secondary | ICD-10-CM | POA: Insufficient documentation

## 2011-10-23 DIAGNOSIS — J029 Acute pharyngitis, unspecified: Secondary | ICD-10-CM | POA: Insufficient documentation

## 2011-10-23 DIAGNOSIS — G43909 Migraine, unspecified, not intractable, without status migrainosus: Secondary | ICD-10-CM | POA: Insufficient documentation

## 2011-10-23 DIAGNOSIS — I10 Essential (primary) hypertension: Secondary | ICD-10-CM | POA: Insufficient documentation

## 2011-10-23 MED ORDER — SUMATRIPTAN SUCCINATE 100 MG PO TABS: ORAL_TABLET | ORAL | Status: DC

## 2011-10-23 MED ORDER — DIPHENHYDRAMINE HCL 50 MG/ML IJ SOLN
12.5000 mg | Freq: Once | INTRAMUSCULAR | Status: AC
  Administered 2011-10-23: 12.5 mg via INTRAVENOUS
  Filled 2011-10-23: qty 1

## 2011-10-23 MED ORDER — SODIUM CHLORIDE 0.9 % IV BOLUS (SEPSIS)
1000.0000 mL | Freq: Once | INTRAVENOUS | Status: AC
  Administered 2011-10-23: 1000 mL via INTRAVENOUS

## 2011-10-23 MED ORDER — ACETAMINOPHEN 500 MG PO TABS
1000.0000 mg | ORAL_TABLET | Freq: Once | ORAL | Status: AC
  Administered 2011-10-24: 1000 mg via ORAL
  Filled 2011-10-23: qty 2

## 2011-10-23 MED ORDER — METOCLOPRAMIDE HCL 5 MG/ML IJ SOLN
10.0000 mg | Freq: Once | INTRAMUSCULAR | Status: AC
  Administered 2011-10-23: 10 mg via INTRAVENOUS
  Filled 2011-10-23: qty 2

## 2011-10-23 MED ORDER — KETOROLAC TROMETHAMINE 30 MG/ML IJ SOLN
30.0000 mg | Freq: Once | INTRAMUSCULAR | Status: AC
  Administered 2011-10-23: 30 mg via INTRAVENOUS
  Filled 2011-10-23: qty 1

## 2011-10-23 NOTE — Discharge Instructions (Signed)
Take imitrex as directed, immediately at onset of headache. You may continue to alternate between tylenol and ibuprofen for additional pain relief and for sore throat. You may consider over the counter benadryl to decrease secretions for additional relief. Stay well hydrated. Follow up with Dr. Bruna Potter for recheck of ongoing recurrent headaches but return to ER for changing or worsening of symptoms.   Migraine Headache A migraine headache is an intense, throbbing pain on one or both sides of your head. The exact cause of a migraine headache is not always known. A migraine may be caused when nerves in the brain become irritated and release chemicals that cause swelling within blood vessels, causing pain. Many migraine sufferers have a family history of migraines. Before you get a migraine you may or may not get an aura. An aura is a group of symptoms that can predict the beginning of a migraine. An aura may include:  Visual changes such as:   Flashing lights.   Bright spots or zig-zag lines.   Tunnel vision.   Feelings of numbness.   Trouble talking.   Muscle weakness.  SYMPTOMS  Pain on one or both sides of your head.   Pain that is pulsating or throbbing in nature.   Pain that is severe enough to prevent daily activities.   Pain that is aggravated by any daily physical activity.   Nausea (feeling sick to your stomach), vomiting, or both.   Pain with exposure to bright lights, loud noises, or activity.   General sensitivity to bright lights or loud noises.  MIGRAINE TRIGGERS Examples of triggers of migraine headaches include:   Alcohol.   Smoking.   Stress.   It may be related to menses (female menstruation).   Aged cheeses.   Foods or drinks that contain nitrates, glutamate, aspartame, or tyramine.   Lack of sleep.   Chocolate.   Caffeine.   Hunger.   Medications such as nitroglycerine (used to treat chest pain), birth control pills, estrogen, and some blood  pressure medications.  DIAGNOSIS  A migraine headache is often diagnosed based on:  Symptoms.   Physical examination.   A computerized X-ray scan (computed tomography, CT) of your head.  TREATMENT  Medications can help prevent migraines if they are recurrent or should they become recurrent. Your caregiver can help you with a medication or treatment program that will be helpful to you.   Lying down in a dark, quiet room may be helpful.   Keeping a headache diary may help you find a trend as to what may be triggering your headaches.  SEEK IMMEDIATE MEDICAL CARE IF:   You have confusion, personality changes or seizures.   You have headaches that wake you from sleep.   You have an increased frequency in your headaches.   You have a stiff neck.   You have a loss of vision.   You have muscle weakness.   You start losing your balance or have trouble walking.   You feel faint or pass out.  MAKE SURE YOU:   Understand these instructions.   Will watch your condition.   Will get help right away if you are not doing well or get worse.  Document Released: 07/21/2005 Document Revised: 07/10/2011 Document Reviewed: 03/06/2009 [facility] Patient Information 2012 [place], [place].  Sore Throat Sore throats may be caused by bacteria and viruses. They may also be caused by:  Smoking.   Pollution.   Allergies.  If a sore throat is due to strep  infection (a bacterial infection), you may need:  A throat swab.   A culture test to verify the strep infection.  You will need one of these:  An antibiotic shot.   Oral medicine for a full 10 days.  Strep infection is very contagious. A doctor should check any close contacts who have a sore throat or fever. A sore throat caused by a virus infection will usually last only 3-4 days. Antibiotics will not treat a viral sore throat.  Infectious mononucleosis (a viral disease), however, can cause a sore throat that lasts for up to 3 weeks.  Mononucleosis can be diagnosed with blood tests. You must have been sick for at least 1 week in order for the test to give accurate results. HOME CARE INSTRUCTIONS   To treat a sore throat, take mild pain medicine.   Increase your fluids.   Eat a soft diet.   Do not smoke.   Gargling with warm water or salt water (1 tsp. salt in 8 oz. water) can be helpful.   Try throat sprays or lozenges or sucking on hard candy to ease the symptoms.  Call your doctor if your sore throat lasts longer than 1 week.  SEEK IMMEDIATE MEDICAL CARE IF:  You have difficulty breathing.   You have increased swelling in the throat.   You have pain so severe that you are unable to swallow fluids or your saliva.   You have a severe headache, a high fever, vomiting, or a red rash.  Document Released: 08/28/2004 Document Revised: 07/10/2011 Document Reviewed: 07/08/2007 [facility] Patient Information 2012 [place], [place].

## 2011-10-23 NOTE — ED Provider Notes (Signed)
History     CSN: [id number]  Arrival date & time 10/23/11  1714   First MD Initiated Contact with Patient 10/23/11 2154      Chief Complaint  Patient presents with  . Migraine  . Generalized Body Aches    (Consider location/radiation/quality/duration/timing/severity/associated sxs/prior treatment) HPI  Patient presents to emergency department complaining of a 24-48 hour history of migraine headache as well as acute onset sore throat, body aches, and chills. Patient states she has a history of recurrent migraine headaches that she had been on migraine medication in the past by her primary care physician but ran out of this unknown migraine medication many months ago however states that over the last 2 months she's once again had recurrent migraine headaches that she's been treating at home with ibuprofen with some temporary benefit. Patient states that approximately 24-48 hours ago she had gradual onset migraine headache that she's states feels like her usual recurrent migraine headache with it she woke in her sleep last night with acute onset sore throat, body aches, and chills. Patient states that since onset she's been having ongoing right sore throat and chills as well as ongoing migraines. Patient states migraine is associated with photophobia and phonophobia. Patient denies any difficulty swallowing but states painful swallowing. She denies any known fevers but has felt chilled. Patient initially presented to [facility] ER earlier in the day but left due to to wait times.  Past Medical History  Diagnosis Date  . Hypertension     no meds x 6mos, controlled with exercise and diet  . Anemia   . Anxiety   . 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 Tubal ligation 1993  . Dilation and curretage     after a miscarriage  . Endometrial ablation   . Laparoscopy 03/19/2011    Procedure: LAPAROSCOPY OPERATIVE;   Surgeon: Hollie Salk C. Marice Potter, MD;  Location: [facility];  Service: Gynecology;  Laterality: N/A;  . Novasure ablation 03/19/2011    Procedure: NOVASURE ABLATION;  Surgeon: Hollie Salk C. Marice Potter, MD;  Location: [facility];  Service: Gynecology;  Laterality: N/A;    Family History  Problem Relation Age of Onset  . Diabetes Mother   . Hypertension Mother   . Hyperlipidemia Mother   . Diabetes Maternal Aunt   . Cancer Maternal Aunt     breast, metastatic cancer throughout body  . Cancer Maternal Uncle     leukemia  . Diabetes Maternal Grandmother   . Cancer Maternal Uncle     colon    History  Substance Use Topics  . Smoking status: Former Games developer  . Smokeless tobacco: Never Used  . Alcohol Use: No    OB History    Grav Para Term Preterm Abortions TAB SAB Ect Mult Living   4 3 3  1  1   3       Review of Systems  All other systems reviewed and are negative.    Allergies  Review of patient's allergies indicates no known allergies.  Home Medications   Current Outpatient Rx  Name Route Sig Dispense Refill  . ALPRAZOLAM 1 MG PO TABS Oral Take 0.5-1 mg by mouth 2 (two) times daily as needed. For anxiety     . IBUPROFEN 800 MG PO TABS Oral Take 800 mg by mouth 3 (three) times daily as needed. pain      BP 114/69  Pulse 123  Temp(Src)  100 F (37.8 C) (Oral)  Resp 20  SpO2 100%  Physical Exam  Nursing note and vitals reviewed. Constitutional: She is oriented to person, place, and time. She appears well-developed and well-nourished. No distress.  HENT:  Head: Normocephalic and atraumatic.       Mild erythema bilateral tonsils with mild bilateral tonsillar enlargement but no exudate. Uvula is midline. Patent airway  Eyes: Conjunctivae and EOM are normal. Pupils are equal, round, and reactive to light.  Neck: Normal range of motion. Neck supple. No Brudzinski's sign and no Kernig's sign noted.       No meningeal signs.   Cardiovascular: Regular rhythm, normal heart sounds and intact distal  pulses.  Tachycardia present.  Exam reveals no gallop and no friction rub.   No murmur heard. Pulmonary/Chest: Effort normal and breath sounds normal. No respiratory distress. She has no wheezes. She has no rales. She exhibits no tenderness.  Abdominal: Bowel sounds are normal. She exhibits no distension and no mass. There is no tenderness. There is no rebound and no guarding.  Musculoskeletal: Normal range of motion. She exhibits no edema and no tenderness.  Lymphadenopathy:    She has no cervical adenopathy.  Neurological: She is alert and oriented to person, place, and time.  Skin: Skin is warm and dry. No rash noted. She is not diaphoretic. No erythema.  Psychiatric: She has a normal mood and affect.    ED Course  Procedures (including critical care time)  IV toradol, benadryl, reglan, fluids  11:37 PM Patient states migraine has decreased from 10 to 5/10 and feeling much improved. Tolerating fluids well.   Labs Reviewed  RAPID STREP SCREEN   No results found.   1. Migraine   2. Pharyngitis       MDM  No meningeal signs throughout ER stay. Nontoxic-appearing, no neuro focal findings. Patient is ambulating without difficulty. Patient states her migraines is close to 0/10 at this point. Strep negative. Swallowing secretions well. No signs or symptoms of tonsillar abscess. Patient is now afebrile. Question migraine with associated URI. Patient is agreeable to follow up with PCP for further evaluation and management of recurrent migraines. Spoke at length about precautions that should warrent return to ER. Patient voices understanding and is agreeable to plan.         Jenness Corner, PA 10/24/11 0100

## 2011-10-23 NOTE — ED Notes (Signed)
Woke w/ migrain this am having  Hot flashes

## 2011-10-23 NOTE — ED Notes (Signed)
Bed:[facility]<BR> Expected date:<BR> Expected time:<BR> Means of arrival:<BR> Comments:<BR> triage

## 2011-10-23 NOTE — ED Notes (Signed)
Pt in c/o migraine since yesterday, today noted body aches and sore throat

## 2011-10-23 NOTE — ED Notes (Signed)
Pt's husband up to desk complaining about delays.  Apologized for delays and explained triage process to pt's husband.  Husband verbalized understanding and stated he was taking pt to another hospital and walked out the door.

## 2011-10-24 MED ORDER — SUMATRIPTAN SUCCINATE 100 MG PO TABS: ORAL_TABLET | ORAL | Status: DC

## 2011-10-25 NOTE — ED Provider Notes (Signed)
History/physical exam/procedure(s) were performed by non-physician practitioner and as supervising physician I was immediately available for consultation/collaboration. I have reviewed all notes and am in agreement with care and plan.   Hilario Quarry, MD 10/25/11 [phone]

## 2012-05-01 ENCOUNTER — Encounter (HOSPITAL_COMMUNITY): Payer: Self-pay | Admitting: *Deleted

## 2012-05-01 ENCOUNTER — Emergency Department (HOSPITAL_COMMUNITY)
Admission: EM | Admit: 2012-05-01 | Discharge: 2012-05-01 | Disposition: A | Discharge status: Home / Self Care | Payer: PRIVATE HEALTH INSURANCE | Attending: Emergency Medicine | Admitting: Emergency Medicine

## 2012-05-01 DIAGNOSIS — D649 Anemia, unspecified: Secondary | ICD-10-CM | POA: Insufficient documentation

## 2012-05-01 DIAGNOSIS — J349 Unspecified disorder of nose and nasal sinuses: Secondary | ICD-10-CM

## 2012-05-01 DIAGNOSIS — J3489 Other specified disorders of nose and nasal sinuses: Secondary | ICD-10-CM | POA: Insufficient documentation

## 2012-05-01 DIAGNOSIS — F411 Generalized anxiety disorder: Secondary | ICD-10-CM | POA: Insufficient documentation

## 2012-05-01 DIAGNOSIS — I10 Essential (primary) hypertension: Secondary | ICD-10-CM | POA: Insufficient documentation

## 2012-05-01 DIAGNOSIS — Z87891 Personal history of nicotine dependence: Secondary | ICD-10-CM | POA: Insufficient documentation

## 2012-05-01 MED ORDER — AMOXICILLIN-POT CLAVULANATE 875-125 MG PO TABS
1.0000 | ORAL_TABLET | Freq: Once | ORAL | Status: AC
  Administered 2012-05-01: 1 via ORAL
  Filled 2012-05-01: qty 1

## 2012-05-01 MED ORDER — PSEUDOEPHEDRINE HCL ER 120 MG PO TB12: 120.0000 mg | ORAL_TABLET | Freq: Two times a day (BID) | ORAL | Status: DC

## 2012-05-01 MED ORDER — AMOXICILLIN-POT CLAVULANATE 875-125 MG PO TABS: 1.0000 | ORAL_TABLET | Freq: Two times a day (BID) | ORAL | Status: DC

## 2012-05-01 NOTE — ED Notes (Signed)
Pt stated that she has been having intermittent rt side facial pain x 2 days. She also has been having R sided dental pain and right sided headache. Pt complaining of sinus pain and pressure. Also having right ear pain. Slight dizziness with standing . No blurred vision or weakness. No altered LOC. No cardiac or respiratory distress. Will continue to monitor.

## 2012-05-01 NOTE — ED Notes (Signed)
The pt also has intermittent dizziness

## 2012-05-01 NOTE — ED Notes (Signed)
Rt facial pain for  2 days  And she has had some dental discomfort

## 2012-05-01 NOTE — ED Provider Notes (Signed)
History     CSN: [id number]  Arrival date & time 05/01/12  1946   First MD Initiated Contact with Patient 05/01/12 2303      Chief Complaint  Patient presents with  . Facial Pain    (Consider location/radiation/quality/duration/timing/severity/associated sxs/prior treatment) HPI Comments: 3 days of R facial pain nasal congestion  The history is provided by the patient.    Past Medical History  Diagnosis Date  . Hypertension     no meds x 6mos, controlled with exercise and diet  . Anemia   . Anxiety   . 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 Tubal ligation 1993  . Dilation and curretage     after a miscarriage  . Endometrial ablation   . Laparoscopy 03/19/2011    Procedure: LAPAROSCOPY OPERATIVE;  Surgeon: Hollie Salk C. Marice Potter, MD;  Location: [facility];  Service: Gynecology;  Laterality: N/A;  . Novasure ablation 03/19/2011    Procedure: NOVASURE ABLATION;  Surgeon: Hollie Salk C. Marice Potter, MD;  Location: [facility];  Service: Gynecology;  Laterality: N/A;    Family History  Problem Relation Age of Onset  . Diabetes Mother   . Hypertension Mother   . Hyperlipidemia Mother   . Diabetes Maternal Aunt   . Cancer Maternal Aunt     breast, metastatic cancer throughout body  . Cancer Maternal Uncle     leukemia  . Diabetes Maternal Grandmother   . Cancer Maternal Uncle     colon    History  Substance Use Topics  . Smoking status: Former Games developer  . Smokeless tobacco: Never Used  . Alcohol Use: No    OB History    Grav Para Term Preterm Abortions TAB SAB Ect Mult Living   4 3 3  1  1   3       Review of Systems  Constitutional: Negative for fever and chills.  HENT: Positive for congestion. Negative for rhinorrhea.   Gastrointestinal: Negative for nausea.  Skin: Negative for wound.  Neurological: Negative for dizziness, weakness and headaches.    Allergies  Imitrex  Home Medications   Current  Outpatient Rx  Name Route Sig Dispense Refill  . ACETAMINOPHEN 500 MG PO TABS Oral Take 1,000 mg by mouth every 6 (six) hours as needed. For pain    . ALPRAZOLAM 1 MG PO TABS Oral Take 0.5-1 mg by mouth 2 (two) times daily as needed. For anxiety     . AMOXICILLIN-POT CLAVULANATE 875-125 MG PO TABS Oral Take 1 tablet by mouth 2 (two) times daily. 13 tablet 0  . PSEUDOEPHEDRINE HCL ER 120 MG PO TB12 Oral Take 1 tablet (120 mg total) by mouth every 12 (twelve) hours. 20 tablet 0    BP 135/82  Pulse 81  Temp 98 F (36.7 C) (Oral)  Resp 18  SpO2 100%  LMP 03/31/2012  Physical Exam  Constitutional: She appears well-developed and well-nourished.  HENT:  Head: Normocephalic.    Nose: Right sinus exhibits maxillary sinus tenderness.  Eyes: EOM are normal. Right conjunctiva is not injected. Right pupil is round and reactive. Pupils are equal.      ED Course  Procedures (including critical care time)  Labs Reviewed - No data to display No results found.   1. Sinus disease       MDM  Sinusitis will treat with antibiotics and decongestant         Arman Filter,  NP 05/01/12 2347

## 2012-05-02 ENCOUNTER — Inpatient Hospital Stay (HOSPITAL_COMMUNITY)
Admission: EM | Admit: 2012-05-02 | Discharge: 2012-05-04 | DRG: 603 | Disposition: A | Discharge status: Home / Self Care | Payer: PRIVATE HEALTH INSURANCE | Attending: Radiation Oncology | Admitting: Radiation Oncology

## 2012-05-02 ENCOUNTER — Encounter (HOSPITAL_COMMUNITY): Payer: Self-pay | Admitting: Emergency Medicine

## 2012-05-02 DIAGNOSIS — L03211 Cellulitis of face: Principal | ICD-10-CM | POA: Diagnosis present

## 2012-05-02 DIAGNOSIS — F419 Anxiety disorder, unspecified: Secondary | ICD-10-CM

## 2012-05-02 DIAGNOSIS — Z87891 Personal history of nicotine dependence: Secondary | ICD-10-CM

## 2012-05-02 DIAGNOSIS — Z888 Allergy status to other drugs, medicaments and biological substances status: Secondary | ICD-10-CM

## 2012-05-02 DIAGNOSIS — I10 Essential (primary) hypertension: Secondary | ICD-10-CM | POA: Diagnosis present

## 2012-05-02 DIAGNOSIS — F1411 Cocaine abuse, in remission: Secondary | ICD-10-CM | POA: Diagnosis present

## 2012-05-02 DIAGNOSIS — R000 Tachycardia, unspecified: Secondary | ICD-10-CM | POA: Diagnosis present

## 2012-05-02 DIAGNOSIS — F1011 Alcohol abuse, in remission: Secondary | ICD-10-CM | POA: Diagnosis present

## 2012-05-02 DIAGNOSIS — L0201 Cutaneous abscess of face: Principal | ICD-10-CM | POA: Diagnosis present

## 2012-05-02 DIAGNOSIS — F411 Generalized anxiety disorder: Secondary | ICD-10-CM | POA: Diagnosis present

## 2012-05-02 DIAGNOSIS — R112 Nausea with vomiting, unspecified: Secondary | ICD-10-CM | POA: Diagnosis not present

## 2012-05-02 DIAGNOSIS — D72829 Elevated white blood cell count, unspecified: Secondary | ICD-10-CM | POA: Diagnosis present

## 2012-05-02 LAB — BASIC METABOLIC PANEL
BUN: 5 mg/dL — ABNORMAL LOW (ref 6–23)
Chloride: 99 mEq/L (ref 96–112)
GFR calc Af Amer: >90 mL/min (ref >90)
Potassium: 3.6 mEq/L (ref 3.5–5.1)
Sodium: 134 mEq/L — ABNORMAL LOW (ref 135–145)

## 2012-05-02 LAB — CBC
HCT: 36.7 % (ref 36.0–46.0)
RDW: 14.3 % (ref 11.5–15.5)
WBC: 14.2 10*3/uL — ABNORMAL HIGH (ref 4.0–10.5)

## 2012-05-02 MED ORDER — MORPHINE SULFATE 4 MG/ML IJ SOLN
4.0000 mg | INTRAMUSCULAR | Status: DC | PRN
  Administered 2012-05-02: 4 mg via INTRAVENOUS
  Filled 2012-05-02 (×2): qty 1

## 2012-05-02 MED ORDER — MORPHINE SULFATE 4 MG/ML IJ SOLN
4.0000 mg | Freq: Once | INTRAMUSCULAR | Status: AC
  Administered 2012-05-02: 4 mg via INTRAVENOUS
  Filled 2012-05-02: qty 1

## 2012-05-02 MED ORDER — MORPHINE SULFATE 4 MG/ML IJ SOLN
4.0000 mg | Freq: Once | INTRAMUSCULAR | Status: AC
  Administered 2012-05-02: 4 mg via INTRAVENOUS

## 2012-05-02 MED ORDER — SODIUM CHLORIDE 0.9 % IV SOLN
1000.0000 mL | Freq: Once | INTRAVENOUS | Status: AC
  Administered 2012-05-02: 1000 mL via INTRAVENOUS

## 2012-05-02 MED ORDER — CLINDAMYCIN PHOSPHATE 600 MG/50ML IV SOLN
600.0000 mg | Freq: Three times a day (TID) | INTRAVENOUS | Status: DC
  Administered 2012-05-02 – 2012-05-03 (×2): 600 mg via INTRAVENOUS
  Filled 2012-05-02 (×4): qty 50

## 2012-05-02 MED ORDER — CLINDAMYCIN PHOSPHATE 600 MG/50ML IV SOLN
600.0000 mg | INTRAVENOUS | Status: AC
  Administered 2012-05-02: 600 mg via INTRAVENOUS
  Filled 2012-05-02: qty 50

## 2012-05-02 MED ORDER — SODIUM CHLORIDE 0.9 % IV SOLN
1000.0000 mL | INTRAVENOUS | Status: DC
  Administered 2012-05-02: 1000 mL via INTRAVENOUS

## 2012-05-02 MED ORDER — ONDANSETRON HCL 4 MG/2ML IJ SOLN
4.0000 mg | Freq: Four times a day (QID) | INTRAMUSCULAR | Status: DC | PRN
  Administered 2012-05-02: 4 mg via INTRAVENOUS
  Filled 2012-05-02: qty 2

## 2012-05-02 NOTE — ED Notes (Signed)
Pt. Stated, i was here yesterday dx with a sinus infection and given antibiotic and when i woke up my rt. Side of my face was swollen.

## 2012-05-02 NOTE — ED Notes (Signed)
Patient is resting without distress. States pain to face is a lot better.

## 2012-05-02 NOTE — ED Provider Notes (Signed)
3:00 PM Assumed care of patient in the CDU.  Patient is currently on the Cellulitis Protocol.  Patient presents today with facial swelling.  Plan is for the patient to have IV Clindamycin and then discharge home if cellulitis is not worsening.  Reassessed patient.  Patient is comfortable at this time.  Patient alert and orientated x 3, Heart RRR, Lungs CTAB, Patient with significant swelling of the right side of his face.   7:00 PM Patient has received one dose of IV Clindamycin.  Reassessed patient.  Patient has mild improvement in facial swelling.  She states that her pain is controlled at this time.  Patient is scheduled to have another dose of Clindamycin.  Will reassess patient again after second dose.  11:30 pm Patient is comfortable at this time.  Patient continues to have significant swelling of the right side of the face, but feels that it has improved somewhat.  Will keep patient overnight for another dose of antibiotic and reassess in the morning.  12:00 AM Patient signed out to Dr. Arnoldo Morale at shift change.  Pascal Lux Ellsworth, PA-C 05/03/12 1316

## 2012-05-02 NOTE — ED Provider Notes (Cosign Needed)
History   This chart was scribed for Ward Givens, MD by Melba Coon. The patient was seen in room CD01C/CD01C and the patient's care was started at 1:01PM.    CSN: [id number]  Arrival date & time 05/02/12  1116   First MD Initiated Contact with Patient 05/02/12 1238      Chief Complaint  Patient presents with  . Facial Swelling    (Consider location/radiation/quality/duration/timing/severity/associated sxs/prior treatment) The history is provided by the patient. No language interpreter was used.  Gloria Hall is a 41 y.o. female who presents to the Emergency Department complaining of constant, moderate to severe right facial pain and swelling with an onset 2 days ago. She was here at the ED last night, was diagnosed sinusitis, and was given abx. She took the Rx abx as well as a sudafed last night which did not allveiate the present symptoms. This morning, her condition worsened. She reports she now has facial swelling when she awakened and the swelling is progressing as the day goes on. She reports vomit x1 this morning with persistent nausea  Gloria Hall has never experienced the present symptoms before. Denies fever, neck pain, sore throat, rash, back pain, CP, SOB, abd pain, diarrhea, or extremity pain, edema, weakness, numbness, or tingling. No other pertinent medical symptoms.  PCP [facility]  Past Medical History  Diagnosis Date  . Hypertension     no meds x 6mos, controlled with exercise and diet  . Anemia   . Anxiety   . 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 Tubal ligation 1993  . Dilation and curretage     after a miscarriage  . Endometrial ablation   . Laparoscopy 03/19/2011    Procedure: LAPAROSCOPY OPERATIVE;  Surgeon: Hollie Salk C. Marice Potter, MD;  Location: [facility];  Service: Gynecology;  Laterality: N/A;  . Novasure ablation 03/19/2011    Procedure: NOVASURE ABLATION;  Surgeon: Hollie Salk C.  Marice Potter, MD;  Location: [facility];  Service: Gynecology;  Laterality: N/A;    Family History  Problem Relation Age of Onset  . Diabetes Mother   . Hypertension Mother   . Hyperlipidemia Mother   . Diabetes Maternal Aunt   . Cancer Maternal Aunt     breast, metastatic cancer throughout body  . Cancer Maternal Uncle     leukemia  . Diabetes Maternal Grandmother   . Cancer Maternal Uncle     colon    History  Substance Use Topics  . Smoking status: Former Games developer  . Smokeless tobacco: Never Used  . Alcohol Use: No  employed  OB History    Grav Para Term Preterm Abortions TAB SAB Ect Mult Living   4 3 3  1  1   3       Review of Systems 10 Systems reviewed and all are negative for acute change except as noted in the HPI.   Allergies  Imitrex  Home Medications   Current Outpatient Rx  Name Route Sig Dispense Refill  . ACETAMINOPHEN 500 MG PO TABS Oral Take 1,000 mg by mouth every 6 (six) hours as needed. For pain    . ALPRAZOLAM 1 MG PO TABS Oral Take 0.5-1 mg by mouth 2 (two) times daily as needed. For anxiety     . AMOXICILLIN-POT CLAVULANATE 875-125 MG PO TABS Oral Take 1 tablet by mouth 2 (two) times daily. 13 tablet 0  . PSEUDOEPHEDRINE HCL ER 120 MG  PO TB12 Oral Take 1 tablet (120 mg total) by mouth every 12 (twelve) hours. 20 tablet 0    BP 121/88  Pulse 99  Temp 98.7 F (37.1 C) (Oral)  Resp 16  SpO2 95%  LMP 03/31/2012  Vital signs normal    Physical Exam  Constitutional: She is oriented to person, place, and time. She appears well-developed and well-nourished.  Non-toxic appearance. She does not appear ill. No distress.  HENT:  Head: Normocephalic and atraumatic.  Right Ear: External ear normal.  Left Ear: External ear normal.  Nose: Nose normal. No mucosal edema or rhinorrhea.  Mouth/Throat: Oropharynx is clear and moist and mucous membranes are normal. No dental abscesses or uvula swelling.       Diffuse swelling and erythma on rt side of face  involving the  rt lower eyelid to angle of jaw. Teeth on rt upper - no cavities or TTP but swelling and gingival swelling around the molars.  Eyes: Conjunctivae normal and EOM are normal. Pupils are equal, round, and reactive to light.  Neck: Normal range of motion and full passive range of motion without pain. Neck supple.  Cardiovascular: Regular rhythm and normal heart sounds.  Tachycardia present.  Exam reveals no gallop and no friction rub.   No murmur heard. Pulmonary/Chest: Effort normal and breath sounds normal. No respiratory distress. She has no wheezes. She has no rhonchi. She has no rales. She exhibits no tenderness and no crepitus.  Abdominal: Soft. Normal appearance and bowel sounds are normal. She exhibits no distension. There is no tenderness. There is no rebound and no guarding.  Musculoskeletal: Normal range of motion. She exhibits no edema and no tenderness.       Moves all extremities well.   Lymphadenopathy:    She has no cervical adenopathy.  Neurological: She is alert and oriented to person, place, and time. She has normal strength. No cranial nerve deficit.  Skin: Skin is warm, dry and intact. No rash noted. No erythema. No pallor.  Psychiatric: She has a normal mood and affect. Her speech is normal and behavior is normal. Her mood appears not anxious.    ED Course  Procedures (including critical care time)   Medications  0.9 %  sodium chloride infusion (1000 mL Intravenous New Bag/Given 05/02/12 1455)    Followed by  0.9 %  sodium chloride infusion (1000 mL Intravenous New Bag/Given 05/02/12 1648)  clindamycin (CLEOCIN) IVPB 600 mg (not administered)  morphine 4 MG/ML injection 4 mg (4 mg Intravenous Given 05/02/12 1500)  ondansetron (ZOFRAN) injection 4 mg (4 mg Intravenous Given 05/02/12 1457)  clindamycin (CLEOCIN) IVPB 600 mg (600 mg Intravenous Given 05/02/12 1502)  morphine 4 MG/ML injection 4 mg (4 mg Intravenous Given 05/02/12 1650)     COORDINATION OF  CARE:  1:06PM - Gloria Hall will be transfereed to CDU for observation - cellulitis protocol. 1:42PM - Magnus Sinning, PA, at CDU accepts the transfer to CDU.   Results for orders placed during the hospital encounter of 05/02/12  CBC      Component Value Range   WBC 14.2 (*) 4.0 - 10.5 K/uL   RBC 4.57  3.87 - 5.11 MIL/uL   Hemoglobin 11.8 (*) 12.0 - 15.0 g/dL   HCT 96.0  45.4 - 09.8 %   MCV 80.3  78.0 - 100.0 fL   MCH 25.8 (*) 26.0 - 34.0 pg   MCHC 32.2  30.0 - 36.0 g/dL   RDW 11.9  14.7 -  15.5 %   Platelets 300  150 - 400 K/uL  BASIC METABOLIC PANEL      Component Value Range   Sodium 134 (*) 135 - 145 mEq/L   Potassium 3.6  3.5 - 5.1 mEq/L   Chloride 99  96 - 112 mEq/L   CO2 23  19 - 32 mEq/L   Glucose, Bld 74  70 - 99 mg/dL   BUN 5 (*) 6 - 23 mg/dL   Creatinine, Ser 1.61  0.50 - 1.10 mg/dL   Calcium 8.9  8.4 - 09.6 mg/dL   GFR calc non Af Amer >90  >90 mL/min   GFR calc Af Amer >90  >90 mL/min    Laboratory interpretation all normal except leukocytosis    1. Facial cellulitis     Plan treatment in CDU for her facial cellulitis that has failed outpatient therapy.   MDM    I personally performed the services described in this documentation, which was scribed in my presence. The recorded information has been reviewed and considered.        Ward Givens, MD 05/02/12 1726

## 2012-05-02 NOTE — ED Notes (Signed)
Patient is resting without distress. Family remains at bedside.

## 2012-05-03 ENCOUNTER — Encounter (HOSPITAL_COMMUNITY): Payer: Self-pay | Admitting: Internal Medicine

## 2012-05-03 DIAGNOSIS — L03211 Cellulitis of face: Secondary | ICD-10-CM | POA: Diagnosis present

## 2012-05-03 DIAGNOSIS — F419 Anxiety disorder, unspecified: Secondary | ICD-10-CM | POA: Diagnosis present

## 2012-05-03 DIAGNOSIS — F410 Panic disorder [episodic paroxysmal anxiety] without agoraphobia: Secondary | ICD-10-CM

## 2012-05-03 DIAGNOSIS — L0201 Cutaneous abscess of face: Principal | ICD-10-CM

## 2012-05-03 DIAGNOSIS — F411 Generalized anxiety disorder: Secondary | ICD-10-CM

## 2012-05-03 DIAGNOSIS — F1910 Other psychoactive substance abuse, uncomplicated: Secondary | ICD-10-CM

## 2012-05-03 LAB — CBC
HCT: 36.7 % (ref 36.0–46.0)
Hemoglobin: 11.5 g/dL — ABNORMAL LOW (ref 12.0–15.0)
MCHC: 31.3 g/dL (ref 30.0–36.0)
RBC: 4.52 MIL/uL (ref 3.87–5.11)
WBC: 10.6 10*3/uL — ABNORMAL HIGH (ref 4.0–10.5)

## 2012-05-03 LAB — CREATININE, SERUM
Creatinine, Ser: 0.69 mg/dL (ref 0.50–1.10)
GFR calc Af Amer: >90 mL/min (ref >90)
GFR calc non Af Amer: >90 mL/min (ref >90)

## 2012-05-03 LAB — RAPID URINE DRUG SCREEN, HOSP PERFORMED
Benzodiazepines: NOT DETECTED
Cocaine: NOT DETECTED
Opiates: NOT DETECTED

## 2012-05-03 MED ORDER — CLINDAMYCIN PHOSPHATE 300 MG/50ML IV SOLN
300.0000 mg | Freq: Three times a day (TID) | INTRAVENOUS | Status: DC
  Administered 2012-05-03 – 2012-05-04 (×3): 300 mg via INTRAVENOUS
  Filled 2012-05-03 (×5): qty 50

## 2012-05-03 MED ORDER — ONDANSETRON HCL 4 MG/2ML IJ SOLN: 4.0000 mg | Freq: Four times a day (QID) | INTRAMUSCULAR | Status: DC | PRN

## 2012-05-03 MED ORDER — ACETAMINOPHEN 650 MG RE SUPP: 650.0000 mg | Freq: Four times a day (QID) | RECTAL | Status: DC | PRN

## 2012-05-03 MED ORDER — HEPARIN SODIUM (PORCINE) 5000 UNIT/ML IJ SOLN
5000.0000 [IU] | Freq: Three times a day (TID) | INTRAMUSCULAR | Status: DC
  Administered 2012-05-03 – 2012-05-04 (×4): 5000 [IU] via SUBCUTANEOUS
  Filled 2012-05-03 (×8): qty 1

## 2012-05-03 MED ORDER — ALUM & MAG HYDROXIDE-SIMETH 200-200-20 MG/5ML PO SUSP: 30.0000 mL | Freq: Four times a day (QID) | ORAL | Status: DC | PRN

## 2012-05-03 MED ORDER — ALPRAZOLAM 0.5 MG PO TABS
0.5000 mg | ORAL_TABLET | Freq: Two times a day (BID) | ORAL | Status: DC | PRN
  Administered 2012-05-03: 1 mg via ORAL
  Administered 2012-05-04: 0.5 mg via ORAL
  Filled 2012-05-03: qty 2
  Filled 2012-05-03: qty 1

## 2012-05-03 MED ORDER — SODIUM CHLORIDE 0.9 % IV SOLN
3.0000 g | Freq: Four times a day (QID) | INTRAVENOUS | Status: DC
  Filled 2012-05-03 (×2): qty 3

## 2012-05-03 MED ORDER — ACETAMINOPHEN 325 MG PO TABS
650.0000 mg | ORAL_TABLET | Freq: Four times a day (QID) | ORAL | Status: DC | PRN
  Administered 2012-05-03 – 2012-05-04 (×3): 650 mg via ORAL
  Filled 2012-05-03 (×3): qty 2

## 2012-05-03 MED ORDER — SODIUM CHLORIDE 0.9 % IV SOLN
INTRAVENOUS | Status: DC
  Administered 2012-05-03: 1000 mL via INTRAVENOUS
  Administered 2012-05-04 (×2): via INTRAVENOUS

## 2012-05-03 MED ORDER — MORPHINE SULFATE 4 MG/ML IJ SOLN
4.0000 mg | Freq: Once | INTRAMUSCULAR | Status: AC
  Administered 2012-05-03: 4 mg via INTRAVENOUS
  Filled 2012-05-03: qty 1

## 2012-05-03 MED ORDER — MORPHINE SULFATE 2 MG/ML IJ SOLN
2.0000 mg | INTRAMUSCULAR | Status: DC | PRN
  Administered 2012-05-03 – 2012-05-04 (×3): 2 mg via INTRAVENOUS
  Filled 2012-05-03 (×3): qty 1

## 2012-05-03 MED ORDER — ONDANSETRON HCL 4 MG PO TABS: 4.0000 mg | ORAL_TABLET | Freq: Four times a day (QID) | ORAL | Status: DC | PRN

## 2012-05-03 NOTE — ED Provider Notes (Signed)
7:30:  Patient eating breakfast. States swelling not improved. Complains of pain. Received 2nd dose IV clinda at 6:30. Patient failed to improve during observation period. Will plan to admit to unassigned for further treatment. Patient and husband agree with care plan. Pain medication ordered. Patient otherwise stable.  PE: Marked right sided facial swelling from inferior border right eye (no upper lid involvement) to Maxillary area. Good dentition. No subjective dental pain. Eye conjunctiva clear, no redness or drainage.   Plan: Admit.  Rodena Medin, PA-C 05/03/12 [phone]

## 2012-05-03 NOTE — ED Provider Notes (Signed)
Medical screening examination/treatment/procedure(s) were performed by non-physician practitioner and as supervising physician I was immediately available for consultation/collaboration.  Tristyn Pharris R. Yannis Broce, MD 05/03/12 2144 

## 2012-05-03 NOTE — H&P (Signed)
Hospital Admission Note Date: 05/03/2012  Patient name: Gloria Hall Medical record number: [id number] Date of birth: 12-23-1970 Age: 41 y.o. Gender: female PCP: Pcp Not In System  Medical Service: Internal Medicine  Attending physician:     1st Contact: Kazibwe     Pager: [phone] 2nd Contact: Illath                            Pager: [phone] After 5 pm or weekends: 1st Contact:      Pager: [phone] 2nd Contact:      Pager: [phone]  Chief Complaint: right sided facial pain and swelling x 4 days  History of Present Illness: 41 year old woman with past medical history significant for polysubstance abuse, anxiety and panic attacks presents to the ED with chief complaint of right-sided facial pain and swelling for last 4 days.  Patient was in her usual state of health until a week ago when she started feeling off and on dizzy that she described as feeling of lightheadedness at work . She presented to the ED on 9/28 (Friday) for some nasal stuffiness along with some maxillary pain and tenderness and was discharged home on Augmentin after a diagnosis of sinusitis was established. Patient did not feel better and returned to the ER on 05/02/2012 for worsening facial pain and swelling. Her symptoms were improving very slowly and patient wa snot feeling safe to go home when IMTS was called for admission. Her swelling was associated with some redness and puffiness around her eyes. She reports that she wasn't able to open her eye fully when it initially started . She does report some watering from her eyes but denies any discharge or blurred vision.  She also reports some right-sided headache, right ear ache but denies any discharge or bleeding from ear.  She endorses some nausea and had an episode of vomiting at work on the day of her admission. Vomitus was nonbilious and nonbloody and contained what she ate , denies any further episodes. Denies any fevers, chills, tooth ache, postnasal drip, sore  throat, dysphagia, dysphonia or shortness of breath.  She denies any trauma to the face or insect bites. She does report noticing small whiteheads that she usually gets that she crushed with her fingers but does not report noticing any boils or furuncles. Meds: Current Outpatient Rx  Name Route Sig Dispense Refill  . ACETAMINOPHEN 500 MG PO TABS Oral Take 1,000 mg by mouth every 6 (six) hours as needed. For pain    . ALPRAZOLAM 1 MG PO TABS Oral Take 0.5-1 mg by mouth 2 (two) times daily as needed. For anxiety     . AMOXICILLIN-POT CLAVULANATE 875-125 MG PO TABS Oral Take 1 tablet by mouth 2 (two) times daily. 13 tablet 0  . PSEUDOEPHEDRINE HCL ER 120 MG PO TB12 Oral Take 1 tablet (120 mg total) by mouth every 12 (twelve) hours. 20 tablet 0    Allergies: Allergies as of 05/02/2012 - Review Complete 05/02/2012  Allergen Reaction Noted  . Imitrex (sumatriptan) Shortness Of Breath 05/01/2012   Past Medical History  Diagnosis Date  . Hypertension     no meds x 6mos, controlled with exercise and diet  . Anemia   . Anxiety   . LSIL (low grade squamous intraepithelial lesion) on Pap smear 08/21/2011   Past Surgical History  Procedure Date  . Tubal ligation 1993  . Dilation and curretage     after a miscarriage  .  Endometrial ablation   . Laparoscopy 03/19/2011    Procedure: LAPAROSCOPY OPERATIVE;  Surgeon: Hollie Salk C. Marice Potter, MD;  Location: [facility];  Service: Gynecology;  Laterality: N/A;  . Novasure ablation 03/19/2011    Procedure: NOVASURE ABLATION;  Surgeon: Hollie Salk C. Marice Potter, MD;  Location: [facility];  Service: Gynecology;  Laterality: N/A;   Family History  Problem Relation Age of Onset  . Diabetes Mother   . Hypertension Mother   . Hyperlipidemia Mother   . Diabetes Maternal Aunt   . Cancer Maternal Aunt     breast, metastatic cancer throughout body  . Cancer Maternal Uncle     leukemia  . Diabetes Maternal Grandmother   . 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 She used to smoke about 2 PPD x 10 years, quit smoking about 4 years ago. She used to drink about 12 PPD and quit drinking about 5 years ago. She also has a history of using crack cocaine but claims that she has been clean for last 5 years.    Review of Systems: Pertinent items are noted in HPI.  Physical Exam: Blood pressure 115/76, pulse 97, temperature 99 F (37.2 C), temperature source Oral, resp. rate 18, last menstrual period 03/31/2012, SpO2 99.00%. BP 115/76  Pulse 97  Temp 99 F (37.2 C) (Oral)  Resp 18  SpO2 99%  LMP 03/31/2012  General Appearance:    Alert, cooperative, no distress, appears stated age  Head:    Normocephalic, without obvious abnormality, atraumatic  Eyes:    PERRL, conjunctiva/corneas clear, EOM's intact, fundi    benign, both eyes  Ears:    Normal TM's and external ear canals, both ears  Nose:   Nares normal, septum midline, mucosa normal, no drainage    or sinus tenderness  Throat:   Lips, mucosa, and tongue normal; teeth and gums normal Diffuse swelling and erythma on rt side of face involving the rt lower eyelid to angle of jaw, with some erythem apresent around lower eyelid ad some warmth. Right upper molar has cavity but does not appear to infected  or TTP but swelling and gingival swelling around the molars   Neck:   Supple, symmetrical, trachea midline, no adenopathy;    thyroid:  no enlargement/tenderness/nodules; no carotid   bruit or JVD  Back:     Symmetric, no curvature, ROM normal, no CVA tenderness  Lungs:     Clear to auscultation bilaterally, respirations unlabored  Chest Wall:    No tenderness or deformity   Heart:    Regular rate and rhythm, S1 and S2 normal, no murmur, rub   or gallop  Breast Exam:    No tenderness, masses, or nipple abnormality  Abdomen:     Soft, non-tender, bowel sounds active all four quadrants,    no masses, no organomegaly  Genitalia:    Normal female  without lesion, discharge or tenderness  Rectal:    Normal tone, normal prostate, no masses or tenderness;   guaiac negative stool  Extremities:   Extremities normal, atraumatic, no cyanosis or edema  Pulses:   2+ and symmetric all extremities  Skin:   Skin color, texture, turgor normal, no rashes or lesions  Lymph nodes:   Cervical, supraclavicular, and axillary nodes normal  Neurologic:   CNII-XII intact, normal strength, sensation and reflexes    throughout    Lab results:  Basic Metabolic Panel:  Basename 05/02/12 1505  NA 134*  K 3.6  CL 99  CO2 23  GLUCOSE 74  BUN 5*  CREATININE 0.53  CALCIUM 8.9  MG --  PHOS --   CBC:  Basename 05/02/12 1505  WBC 14.2*  NEUTROABS --  HGB 11.8*  HCT 36.7  MCV 80.3  PLT 300       Assessment & Plan by Problem: 41 year old woman with past medical history significant for polysubstance abuse, anxiety presented to the ER with right-sided facial pain and swelling for last 4 days along with some nasal congestion.She was discharged from the ED on 9/28 ( two days prior to her admission)on Augmentin and she took one dose but her symptoms worsened so she decided to come to the ER again for evaluation and was observed under CDU cellulitis protocol for one day but her symptoms were improving gradually when IMTS was called to admit for further management.  Active Problems:   #Facial cellulitis: .She presented with facial pain,swelling, erythema , warmth and tenderness to palpation in the maxillary area with puffy right eye .She was afebrile but had elevated white count to 14,000  No inciting event could be clearly identified but this could be related to sinusitis with nasal congestion  vs right upper molar cavity vs infected comedones. Other possibility includes snuffing cocaine, given her h/o polysubstance abuse although she claims to be clean for five years. The area of the face in the medial third ie around the eyes and nose is termed as danger  zone and can sometimes results in complications like cavernous sinus thrombosis.She was treated with oral Augmentin and then IV clindamycin over this course of time. After getting treated with clindamycin, she does report improvement in her symptoms. 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  - Admit to MedSurg bed for observation. - Check UDS, given her history of polysubstance abuse  - Continue clindamycin to cover for MRSA and anaerobes. - Symptomatic treatment with morphine and Zofran for pain and nausea.   #Anxiety/ panic attacks: Continue PRN  Xanax  #Polysubstance abuse , hx of : Check UDS.   #DVT: Heparin    Signed: Daleyssa Loiselle 05/03/2012, 9:42 AM

## 2012-05-03 NOTE — ED Notes (Signed)
Pt up to the bathroom without difficulty. States pain to R side of face. No signs of distress noted. Vital signs stable.

## 2012-05-03 NOTE — Progress Notes (Signed)
ANTIBIOTIC CONSULT NOTE - INITIAL  Pharmacy Consult for unasyn Indication: facial cellulitis  Allergies  Allergen Reactions  . Imitrex (Sumatriptan) Shortness Of Breath    Patient Measurements:   Wt = 92.8 kg as of January 2013  Vital Signs: Temp: 98.4 F (36.9 C) (09/30 1122) Temp src: Oral (09/30 1122) BP: 117/79 mmHg (09/30 1122) Pulse Rate: 80  (09/30 1122) Intake/Output from previous day: 09/29 0701 - 09/30 0700 In: 1000 [I.V.:1000] Out: -  Intake/Output from this shift:    Labs:  Basename 05/02/12 1505  WBC 14.2*  HGB 11.8*  PLT 300  LABCREA --  CREATININE 0.53   The CrCl is unknown because both a height and weight (above a minimum accepted value) are required for this calculation. No results found for this basename: VANCOTROUGH:2,VANCOPEAK:2,VANCORANDOM:2,GENTTROUGH:2,GENTPEAK:2,GENTRANDOM:2,TOBRATROUGH:2,TOBRAPEAK:2,TOBRARND:2,AMIKACINPEAK:2,AMIKACINTROU:2,AMIKACIN:2, in the last 72 hours   Microbiology: No results found for this or any previous visit (from the past 720 hour(s)).  Medical History: Past Medical History  Diagnosis Date  . Hypertension     no meds x 6mos, controlled with exercise and diet  . Anemia   . Anxiety   . 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 acetaminophen (TYLENOL) 500 MG tablet Take 1,000 mg by mouth every 6 (six) hours as needed. For pain      . ALPRAZolam (XANAX) 1 MG tablet Take 0.5-1 mg by mouth 2 (two) times daily as needed. For anxiety       . amoxicillin-clavulanate (AUGMENTIN) 875-125 MG per tablet Take 1 tablet by mouth 2 (two) times daily.  13 tablet  0  . pseudoephedrine (SUDAFED 12 HOUR) 120 MG 12 hr tablet Take 1 tablet (120 mg total) by mouth every 12 (twelve) hours.  20 tablet  0   Assessment: 41 yo F started on augmentin 9/28 for sinsutitis, now  s/p 3 doses of IV clindamycin for facial  cellullitis.  Marked R sided facial swelling from inferior border R eye to maxillary area. WBC on admit = 14.2.  Creat WNL.  T max 99.  Goal of Therapy:  erradicate infection  Plan:  unasyn 3 gm IV q6h F/u culture data and renal function Herby Abraham, Pharm.D. [phone] 05/03/2012 11:50 AM   Len Childs T 05/03/2012,11:45 AM

## 2012-05-04 LAB — CBC
Hemoglobin: 10.2 g/dL — ABNORMAL LOW (ref 12.0–15.0)
MCH: 25.1 pg — ABNORMAL LOW (ref 26.0–34.0)
MCHC: 31.3 g/dL (ref 30.0–36.0)
Platelets: 294 10*3/uL (ref 150–400)
RBC: 4.07 MIL/uL (ref 3.87–5.11)

## 2012-05-04 LAB — COMPREHENSIVE METABOLIC PANEL
CO2: 25 mEq/L (ref 19–32)
Calcium: 8.4 mg/dL (ref 8.4–10.5)
Chloride: 105 mEq/L (ref 96–112)
Creatinine, Ser: 0.6 mg/dL (ref 0.50–1.10)
GFR calc Af Amer: >90 mL/min (ref >90)
GFR calc non Af Amer: >90 mL/min (ref >90)
Glucose, Bld: 87 mg/dL (ref 70–99)
Total Bilirubin: 0.3 mg/dL (ref 0.3–1.2)

## 2012-05-04 MED ORDER — CLINDAMYCIN HCL 300 MG PO CAPS
300.0000 mg | ORAL_CAPSULE | Freq: Three times a day (TID) | ORAL | Status: DC
  Administered 2012-05-04: 300 mg via ORAL
  Filled 2012-05-04 (×3): qty 1

## 2012-05-04 MED ORDER — CLINDAMYCIN HCL 300 MG PO CAPS: 300.0000 mg | ORAL_CAPSULE | Freq: Three times a day (TID) | ORAL | Status: DC

## 2012-05-04 NOTE — Progress Notes (Signed)
Subjective: Patient states she is feeling very well today, and thinks that the R facial swelling improved since she was admitted yesterday.  No interval events.   Objective: Vital signs in last 24 hours: Filed Vitals:   05/03/12 2235 05/04/12 0135 05/04/12 0524 05/04/12 1115  BP: 142/91 128/82 101/59 94/57  Pulse: 76 79 89 83  Temp: 98.4 F (36.9 C) 98.3 F (36.8 C) 98.3 F (36.8 C) 98.3 F (36.8 C)  TempSrc: Oral Oral Oral Oral  Resp: 18 18 18 18   SpO2: 100% 95% 100% 99%   Weight change:   Intake/Output Summary (Last 24 hours) at 05/04/12 1135 Last data filed at 05/04/12 0835  Gross per 24 hour  Intake   2107 ml  Output      2 ml  Net   2105 ml   Physical Exam: General: Awake and alert; NAD HEENT: minimal R facial swelling in the area of the R cheek, no significant erythema. CV: Regular rate and rhythm; no murmurs, rubs, or gallops Resp: Clear to auscultation bilaterally; no wheezes, rales, or rhonchi GI/Abd: Soft, non-tender, non-distended; normoactive bowel sounds Ext: 2+ pulses in all extremities; no edema, clubbing, or cyanosis Skin: Warm, dry, intact   Lab Results: Basic Metabolic Panel:  Lab 05/04/12 0454 05/03/12 1317 05/02/12 1505  NA 137 -- 134*  K 3.9 -- 3.6  CL 105 -- 99  CO2 25 -- 23  GLUCOSE 87 -- 74  BUN 9 -- 5*  CREATININE 0.60 0.69 --  CALCIUM 8.4 -- 8.9  MG -- -- --  PHOS -- -- --   Liver Function Tests:  Lab 05/04/12 0505  AST 11  ALT 8  ALKPHOS 66  BILITOT 0.3  PROT 6.2  ALBUMIN 2.7*   CBC:  Lab 05/04/12 0505 05/03/12 1317  WBC 7.9 10.6*  NEUTROABS -- --  HGB 10.2* 11.5*  HCT 32.6* 36.7  MCV 80.1 81.2  PLT 294 328   Urine Drug Screen: Drugs of Abuse     Component Value Date/Time   LABOPIA NONE DETECTED 05/03/2012 1405   COCAINSCRNUR NONE DETECTED 05/03/2012 1405   LABBENZ NONE DETECTED 05/03/2012 1405   AMPHETMU NONE DETECTED 05/03/2012 1405   THCU NONE DETECTED 05/03/2012 1405   LABBARB NONE DETECTED 05/03/2012 1405    Medications: I have reviewed the patient's current medications. Scheduled Meds:   . clindamycin  300 mg Oral Q8H  . heparin  5,000 Units Subcutaneous Q8H  . DISCONTD: ampicillin-sulbactam (UNASYN) IV  3 g Intravenous Q6H  . DISCONTD: clindamycin (CLEOCIN) IV  300 mg Intravenous Q8H  . DISCONTD: clindamycin (CLEOCIN) IV  600 mg Intravenous Q8H   Continuous Infusions:   . sodium chloride 100 mL/hr at 05/04/12 0217  . DISCONTD: sodium chloride 1,000 mL (05/02/12 1648)   PRN Meds:.acetaminophen, acetaminophen, ALPRAZolam, alum & mag hydroxide-simeth, morphine injection, ondansetron (ZOFRAN) IV, ondansetron, DISCONTD: morphine, DISCONTD: ondansetron (ZOFRAN) IV Assessment/Plan: 41 year old woman with PMH significant for polysubstance abuse, anxiety was admitted for R facial cellulitis.   Facial cellulitis - Leukocytosis resolved. Afebrile. Patient believes swelling is much improved after being admitted and started on clindamycin.  - clindamycin 300mg  q8h x 10d  Anxiety/ panic attacks - Continue PRN Xanax   Polysubstance abuse - UDS negative.   DVT -  Heparin   Dispo - discharge home today with 10-day course of clindamycin to treat her non-purulent facial cellulitis.    LOS: 2 days   Gloria Hall R 05/04/2012, 11:35 AM

## 2012-05-04 NOTE — H&P (Signed)
Internal Medicine teaching Service Attending Dr.Magenta Schmiesing. I have personally examined the patient and reviewed the h and P documented by the Resident. In brief  Chief complaint: right facial swelling HOPI: 41 year old Gloria Hall presents with right facial swelling of few days duration. 9 point review of system as documented in the Resident note. Social history admitting medication family history past surgical history allergies reviewed. Physical examination Notable for: right facial swelling with erythema tenderness and warmth with right eyelid involvement. Labs are significant for : decreasing white count Imaging is significant for: none EKG: none A and P: 41 year old Gloria Hall admitted for facial cellulitis which has resolved with IV clindamycin will be D/c today with oral clindamycin. She does not have risk factors that are identifiable with development of cellulitis.

## 2012-05-04 NOTE — Progress Notes (Signed)
Pt given discharge instructions and a MD note for work. Pt verbalized understanding of all discharge instructions and also where to pick up the medication that the MD called in to the local pharmacy.  Orson Ape D 05/04/2012

## 2012-05-05 NOTE — ED Provider Notes (Signed)
Patient initially seen by me and was taking care of by PA in the CDU.  Ward Givens, MD 05/05/12 [phone]

## 2012-05-06 NOTE — Discharge Summary (Signed)
Patient Name:  Gloria Hall MRN: [id number]  PCP: Pcp Not In System DOB:  [birth year]       Date of Admission:  05/02/2012  Date of Discharge:  05/06/2012      Attending Physician: Dr. Tacey Heap         DISCHARGE DIAGNOSES: Facial cellulitis Anxiety/ panic disorder Polysubstance abuse    DISPOSITION AND FOLLOW-UP: Yuktha Kerchner is to follow-up with the listed providers as detailed below, at which time, the following should be addressed:   1. F/u on R facial cellulitis to assess for resolution after treatment with antibiotics 2. Labs / imaging needed: consider CBC if patient still has signs/symptoms of infection. 3. Pending labs/ test needing follow-up: N/A  Follow-up Information    Follow up with [facility], [place]. (You have an appointment on 05/14/2012 with Dr. Roseanne Reno. )    Contact information:   [address] [place] [address] [phone]            DISCHARGE MEDICATIONS:   Medication List     As of 05/06/2012  6:17 PM    STOP taking these medications         amoxicillin-clavulanate 875-125 MG per tablet   Commonly known as: AUGMENTIN      pseudoephedrine 120 MG 12 hr tablet   Commonly known as: SUDAFED      TAKE these medications         acetaminophen 500 MG tablet   Commonly known as: TYLENOL   Take 1,000 mg by mouth every 6 (six) hours as needed. For pain      ALPRAZolam 1 MG tablet   Commonly known as: XANAX   Take 0.5-1 mg by mouth 2 (two) times daily as needed. For anxiety      clindamycin 300 MG capsule   Commonly known as: CLEOCIN   Take 1 capsule (300 mg total) by mouth every 8 (eight) hours. Take until empty.        PROCEDURES PERFORMED:  No results found.     ADMISSION DATA: H&P: 41 year old woman with past medical history significant for polysubstance abuse, anxiety and panic attacks presents to the ED with chief complaint of right-sided facial pain and swelling for last 4 days.  Patient was in her usual state  of health until a week ago when she started feeling off and on dizzy that she described as feeling of lightheadedness at work . She presented to the ED on 9/28 (Friday) for some nasal stuffiness along with some maxillary pain and tenderness and was discharged home on Augmentin after a diagnosis of sinusitis was established. Patient did not feel better and returned to the ER on 05/02/2012 for worsening facial pain and swelling. Her symptoms were improving very slowly and patient wa snot feeling safe to go home when IMTS was called for admission. Her swelling was associated with some redness and puffiness around her eyes. She reports that she wasn't able to open her eye fully when it initially started . She does report some watering from her eyes but denies any discharge or blurred vision. She also reports some right-sided headache, right ear ache but denies any discharge or bleeding from ear.  She endorses some nausea and had an episode of vomiting at work on the day of her admission. Vomitus was nonbilious and nonbloody and contained what she ate , denies any further episodes. Denies any fevers, chills, tooth ache, postnasal drip, sore throat, dysphagia, dysphonia or shortness of breath.  She denies any trauma to the face or insect bites. She does report noticing small whiteheads that she usually gets that she crushed with her fingers but does not report noticing any boils or furuncles.   Physical Exam: General Appearance:     Alert, cooperative, no distress, appears stated age   Head:     Normocephalic, without obvious abnormality, atraumatic   Eyes:     PERRL, conjunctiva/corneas clear, EOM's intact, fundi      benign, both eyes   Ears:     Normal TM's and external ear canals, both ears   Nose:    Nares normal, septum midline, mucosa normal, no drainage    or sinus tenderness   Throat:    Lips, mucosa, and tongue normal; teeth and gums normal  Diffuse swelling and erythma on rt side of face involving  the rt lower eyelid to angle of jaw, with some erythem apresent around lower eyelid ad some warmth. Right upper molar has cavity but does not appear to infected  or TTP but swelling and gingival swelling around the molars Neck:    Supple, symmetrical, trachea midline, no adenopathy;      thyroid:  no enlargement/tenderness/nodules; no carotid   bruit or JVD   Back:      Symmetric, no curvature, ROM normal, no CVA tenderness   Lungs:      Clear to auscultation bilaterally, respirations unlabored   Chest Wall:     No tenderness or deformity    Heart:     Regular rate and rhythm, S1 and S2 normal, no murmur, rub   or gallop   Breast Exam:     No tenderness, masses, or nipple abnormality   Abdomen:      Soft, non-tender, bowel sounds active all four quadrants,      no masses, no organomegaly   Genitalia:     Normal female without lesion, discharge or tenderness   Rectal:     Normal tone, normal prostate, no masses or tenderness;    guaiac negative stool   Extremities:    Extremities normal, atraumatic, no cyanosis or edema   Pulses:    2+ and symmetric all extremities   Skin:    Skin color, texture, turgor normal, no rashes or lesions   Lymph nodes:    Cervical, supraclavicular, and axillary nodes normal   Neurologic:    CNII-XII intact, normal strength, sensation and reflexes      throughout     Labs: Basic Metabolic Panel:   Basename  05/02/12 1505   NA  134*   K  3.6   CL  99   CO2  23   GLUCOSE  74   BUN  5*   CREATININE  0.53   CALCIUM  8.9   MG  --   PHOS  --    CBC:   Basename  05/02/12 1505   WBC  14.2*   NEUTROABS  --   HGB  11.8*   HCT  36.7   MCV  80.3   PLT  300      HOSPITAL COURSE: Facial cellulitis - Patient had been prescribed augmentin on 9/28 after visiting the ED for this issue (2 days prior to admission), but it did not resolve her symptoms so she returned for further treatment. Patient had erythema, edema, warmth, and tenderness of the R maxillary  area at time of admission, and WBC = 14.2 at that time. IV clindamycin was started in the ED,  which was transitioned to PO once transferred to the floor. Leukocytosis resolved the morning after admission. Afebrile throughout course. Patient believes swelling was much improved prior to discharge (2/2 antibiotic therapy). She was discharged on a 10-day course of clindamycin to cover non-purulent cellulitis (strep + staph coverage, as patient had no purulent drainage in the cellulitic area).   Anxiety/ panic attacks - stable on home xanax.   Polysubstance abuse - UDS negative.    DISCHARGE DATA: Vital Signs: BP 94/57  Pulse 83  Temp 98.3 F (36.8 C) (Oral)  Resp 18  Ht 5\' 3"  (1.6 m)  Wt 223 lb (101.152 kg)  BMI 39.50 kg/m2  SpO2 99%  LMP 03/31/2012   Time spent on discharge: 35 minutes.  Signed: Elfredia Nevins, MD   PGY I, Internal Medicine Resident 05/06/2012, 6:17 PM

## 2012-06-09 ENCOUNTER — Other Ambulatory Visit: Payer: Self-pay | Admitting: Family Medicine

## 2012-06-09 DIAGNOSIS — Z1231 Encounter for screening mammogram for malignant neoplasm of breast: Secondary | ICD-10-CM

## 2012-06-24 ENCOUNTER — Emergency Department (HOSPITAL_COMMUNITY)
Admission: EM | Admit: 2012-06-24 | Discharge: 2012-06-24 | Disposition: A | Discharge status: Home / Self Care | Payer: PRIVATE HEALTH INSURANCE | Attending: Emergency Medicine | Admitting: Emergency Medicine

## 2012-06-24 ENCOUNTER — Encounter (HOSPITAL_COMMUNITY): Payer: Self-pay | Admitting: Physical Medicine and Rehabilitation

## 2012-06-24 DIAGNOSIS — Z862 Personal history of diseases of the blood and blood-forming organs and certain disorders involving the immune mechanism: Secondary | ICD-10-CM | POA: Insufficient documentation

## 2012-06-24 DIAGNOSIS — R059 Cough, unspecified: Secondary | ICD-10-CM | POA: Insufficient documentation

## 2012-06-24 DIAGNOSIS — R61 Generalized hyperhidrosis: Secondary | ICD-10-CM | POA: Insufficient documentation

## 2012-06-24 DIAGNOSIS — R0989 Other specified symptoms and signs involving the circulatory and respiratory systems: Secondary | ICD-10-CM | POA: Insufficient documentation

## 2012-06-24 DIAGNOSIS — F411 Generalized anxiety disorder: Secondary | ICD-10-CM | POA: Insufficient documentation

## 2012-06-24 DIAGNOSIS — R05 Cough: Secondary | ICD-10-CM | POA: Insufficient documentation

## 2012-06-24 DIAGNOSIS — J40 Bronchitis, not specified as acute or chronic: Secondary | ICD-10-CM | POA: Insufficient documentation

## 2012-06-24 DIAGNOSIS — Z79899 Other long term (current) drug therapy: Secondary | ICD-10-CM | POA: Insufficient documentation

## 2012-06-24 DIAGNOSIS — R110 Nausea: Secondary | ICD-10-CM | POA: Insufficient documentation

## 2012-06-24 DIAGNOSIS — Z87891 Personal history of nicotine dependence: Secondary | ICD-10-CM | POA: Insufficient documentation

## 2012-06-24 MED ORDER — ALBUTEROL SULFATE HFA 108 (90 BASE) MCG/ACT IN AERS
2.0000 | INHALATION_SPRAY | Freq: Once | RESPIRATORY_TRACT | Status: AC
  Administered 2012-06-24: 2 via RESPIRATORY_TRACT
  Filled 2012-06-24: qty 6.7

## 2012-06-24 MED ORDER — HYDROCOD POLST-CHLORPHEN POLST 10-8 MG/5ML PO LQCR: 5.0000 mL | Freq: Two times a day (BID) | ORAL | Status: DC | PRN

## 2012-06-24 NOTE — ED Notes (Signed)
Pt presents to department for evaluation of sinus/chest congestion and productive cough. Ongoing x1 week. Pt states green colored sputum. Denies fever. She is alert and oriented x4. Respirations unlabored.

## 2012-06-24 NOTE — ED Notes (Signed)
Pt demonstrated use of inhaler.

## 2012-06-24 NOTE — ED Provider Notes (Signed)
History  This chart was scribed for Gloria Guppy, MD by Ardeen Jourdain, ED Scribe. This patient was seen in room TR07C/TR07C and the patient's care was started at 1236.  CSN: [id number]  Arrival date & time 06/24/12  1149   First MD Initiated Contact with Patient 06/24/12 1236      Chief Complaint  Patient presents with  . Nasal Congestion     The history is provided by the patient. No language interpreter was used.    Gloria Hall is a 41 y.o. female who presents to the Emergency Department complaining of nasal congestion with associated chest congestion, nausea, diaphoresis and productive cough. She reports a green colored sputum.  She states the symptoms started a week ago and have been gradually worsening. She denies fever and emesis. She admits to sick contact. She has a h/o anxiety, anemia and LSIL. She is a former smoker but denies alcohol use.    Past Medical History  Diagnosis Date  . Anemia   . Anxiety   . 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 Tubal ligation 1993  . Dilation and curretage     after a miscarriage  . Endometrial ablation   . Laparoscopy 03/19/2011    Procedure: LAPAROSCOPY OPERATIVE;  Surgeon: Hollie Salk C. Marice Potter, MD;  Location: [facility];  Service: Gynecology;  Laterality: N/A;  . Novasure ablation 03/19/2011    Procedure: NOVASURE ABLATION;  Surgeon: Hollie Salk C. Marice Potter, MD;  Location: [facility];  Service: Gynecology;  Laterality: N/A;    Family History  Problem Relation Age of Onset  . Diabetes Mother   . Hypertension Mother   . Hyperlipidemia Mother   . Diabetes Maternal Aunt   . Cancer Maternal Aunt     breast, metastatic cancer throughout body  . Cancer Maternal Uncle     leukemia  . Diabetes Maternal Grandmother   . Cancer Maternal Uncle     colon    History  Substance Use Topics  . Smoking status: Former Smoker -- 2.0 packs/day for 10 years    Types: Cigarettes    Quit date: 05/03/2008  . Smokeless tobacco: Never Used  . Alcohol Use: No    OB History    Grav Para Term Preterm Abortions TAB SAB Ect Mult Living   4 3 3  1  1   3       Review of Systems  Constitutional: Negative for fever.  HENT: Positive for congestion, sneezing and sinus pressure.   Respiratory: Positive for cough and chest tightness.   Gastrointestinal: Positive for nausea. Negative for vomiting.    Allergies  Imitrex  Home Medications   Current Outpatient Rx  Name  Route  Sig  Dispense  Refill  . ACETAMINOPHEN 500 MG PO TABS   Oral   Take 1,000 mg by mouth every 6 (six) hours as needed. For pain         . ALPRAZOLAM 1 MG PO TABS   Oral   Take 0.5-1 mg by mouth 2 (two) times daily as needed. For anxiety/sleep         . TRAMADOL HCL 50 MG PO TABS   Oral   Take 50 mg by mouth every 6 (six) hours as needed. For knee pain           Triage Vitals: BP 128/83  Pulse 84  Temp 97.6 F (36.4 C)  Resp 16  SpO2 98%  Physical Exam  Nursing note and vitals reviewed. Constitutional: She is oriented to person, place, and time. She appears well-developed and well-nourished. No distress.  HENT:  Head: Normocephalic and atraumatic.  Mouth/Throat: Oropharynx is clear and moist. No oropharyngeal exudate.  Eyes: EOM are normal. Pupils are equal, round, and reactive to light.  Neck: Normal range of motion. Neck supple. No tracheal deviation present.  Cardiovascular: Normal rate, regular rhythm and normal heart sounds.   Pulmonary/Chest: Effort normal. No respiratory distress. She has wheezes.       Mild rhonchi and wheezing on end expiration bilaterally   Abdominal: Soft. She exhibits no distension.  Musculoskeletal: Normal range of motion. She exhibits no edema.  Neurological: She is alert and oriented to person, place, and time.  Skin: Skin is warm and dry.  Psychiatric: She has a normal mood and affect. Her behavior is normal.    ED Course  Procedures  (including critical care time)  DIAGNOSTIC STUDIES: Oxygen Saturation is 98% on room air, normal by my interpretation.    COORDINATION OF CARE:  1:05 PM: Discussed treatment plan which includes treatment for bronchitis with pt at bedside and pt agreed to plan.    Labs Reviewed - No data to display No results found.   No diagnosis found.    MDM  Bronchitis No fever, hypoxia, resp distress.      I personally performed the services described in this documentation, which was scribed in my presence. The recorded information has been reviewed and is accurate.    Gloria Guppy, MD 06/24/12 1309

## 2012-07-02 ENCOUNTER — Ambulatory Visit
Admission: RE | Admit: 2012-07-02 | Discharge: 2012-07-02 | Disposition: A | Discharge status: Home / Self Care | Payer: PRIVATE HEALTH INSURANCE | Source: Ambulatory Visit | Attending: Family Medicine | Admitting: Family Medicine

## 2012-07-02 ENCOUNTER — Ambulatory Visit: Payer: PRIVATE HEALTH INSURANCE

## 2012-07-02 DIAGNOSIS — Z1231 Encounter for screening mammogram for malignant neoplasm of breast: Secondary | ICD-10-CM

## 2012-10-05 ENCOUNTER — Emergency Department (HOSPITAL_COMMUNITY)
Admission: EM | Admit: 2012-10-05 | Discharge: 2012-10-05 | Disposition: A | Discharge status: Home / Self Care | Payer: Self-pay | Attending: Emergency Medicine | Admitting: Emergency Medicine

## 2012-10-05 ENCOUNTER — Emergency Department (HOSPITAL_COMMUNITY): Payer: Self-pay

## 2012-10-05 ENCOUNTER — Encounter (HOSPITAL_COMMUNITY): Payer: Self-pay | Admitting: *Deleted

## 2012-10-05 DIAGNOSIS — R51 Headache: Secondary | ICD-10-CM | POA: Insufficient documentation

## 2012-10-05 DIAGNOSIS — R1011 Right upper quadrant pain: Secondary | ICD-10-CM | POA: Insufficient documentation

## 2012-10-05 DIAGNOSIS — M129 Arthropathy, unspecified: Secondary | ICD-10-CM | POA: Insufficient documentation

## 2012-10-05 DIAGNOSIS — Z79899 Other long term (current) drug therapy: Secondary | ICD-10-CM | POA: Insufficient documentation

## 2012-10-05 DIAGNOSIS — Z862 Personal history of diseases of the blood and blood-forming organs and certain disorders involving the immune mechanism: Secondary | ICD-10-CM | POA: Insufficient documentation

## 2012-10-05 DIAGNOSIS — Z8679 Personal history of other diseases of the circulatory system: Secondary | ICD-10-CM | POA: Insufficient documentation

## 2012-10-05 DIAGNOSIS — F411 Generalized anxiety disorder: Secondary | ICD-10-CM | POA: Insufficient documentation

## 2012-10-05 DIAGNOSIS — R42 Dizziness and giddiness: Secondary | ICD-10-CM | POA: Insufficient documentation

## 2012-10-05 DIAGNOSIS — Z791 Long term (current) use of non-steroidal anti-inflammatories (NSAID): Secondary | ICD-10-CM | POA: Insufficient documentation

## 2012-10-05 DIAGNOSIS — Z87891 Personal history of nicotine dependence: Secondary | ICD-10-CM | POA: Insufficient documentation

## 2012-10-05 HISTORY — DX: Unspecified osteoarthritis, unspecified site: M19.90

## 2012-10-05 HISTORY — DX: Migraine, unspecified, not intractable, without status migrainosus: G43.909

## 2012-10-05 LAB — CBC WITH DIFFERENTIAL/PLATELET
Basophils Absolute: 0 10*3/uL (ref 0.0–0.1)
Basophils Relative: 1 % (ref 0–1)
Eosinophils Absolute: 0.1 10*3/uL (ref 0.0–0.7)
Eosinophils Relative: 2 % (ref 0–5)
HCT: 37.9 % (ref 36.0–46.0)
Hemoglobin: 12.2 g/dL (ref 12.0–15.0)
Lymphocytes Relative: 33 % (ref 12–46)
Lymphs Abs: 2.5 10*3/uL (ref 0.7–4.0)
MCH: 26 pg (ref 26.0–34.0)
MCHC: 32.2 g/dL (ref 30.0–36.0)
MCV: 80.8 fL (ref 78.0–100.0)
Monocytes Absolute: 0.7 10*3/uL (ref 0.1–1.0)
Monocytes Relative: 9 % (ref 3–12)
Neutro Abs: 4.2 10*3/uL (ref 1.7–7.7)
Neutrophils Relative %: 56 % (ref 43–77)
Platelets: 323 10*3/uL (ref 150–400)
RBC: 4.69 MIL/uL (ref 3.87–5.11)
RDW: 13.6 % (ref 11.5–15.5)
WBC: 7.6 10*3/uL (ref 4.0–10.5)

## 2012-10-05 LAB — URINALYSIS, ROUTINE W REFLEX MICROSCOPIC
Bilirubin Urine: NEGATIVE
Glucose, UA: NEGATIVE mg/dL
Ketones, ur: NEGATIVE mg/dL
Nitrite: NEGATIVE
Protein, ur: NEGATIVE mg/dL
Specific Gravity, Urine: 1.009 (ref 1.005–1.030)
Urobilinogen, UA: 0.2 mg/dL (ref 0.0–1.0)
pH: 7 (ref 5.0–8.0)

## 2012-10-05 LAB — COMPREHENSIVE METABOLIC PANEL
ALT: 10 U/L (ref 0–35)
AST: 15 U/L (ref 0–37)
Albumin: 3.4 g/dL — ABNORMAL LOW (ref 3.5–5.2)
Alkaline Phosphatase: 70 U/L (ref 39–117)
BUN: 7 mg/dL (ref 6–23)
CO2: 26 mEq/L (ref 19–32)
Calcium: 9.3 mg/dL (ref 8.4–10.5)
Chloride: 102 mEq/L (ref 96–112)
Creatinine, Ser: 0.58 mg/dL (ref 0.50–1.10)
GFR calc Af Amer: >90 mL/min (ref >90)
GFR calc non Af Amer: >90 mL/min (ref >90)
Glucose, Bld: 91 mg/dL (ref 70–99)
Potassium: 4.2 mEq/L (ref 3.5–5.1)
Sodium: 136 mEq/L (ref 135–145)
Total Bilirubin: 0.5 mg/dL (ref 0.3–1.2)
Total Protein: 7.3 g/dL (ref 6.0–8.3)

## 2012-10-05 LAB — URINE MICROSCOPIC-ADD ON

## 2012-10-05 LAB — LIPASE, BLOOD: Lipase: 30 U/L (ref 11–59)

## 2012-10-05 MED ORDER — SODIUM CHLORIDE 0.9 % IV BOLUS (SEPSIS)
1000.0000 mL | Freq: Once | INTRAVENOUS | Status: AC
  Administered 2012-10-05: 1000 mL via INTRAVENOUS

## 2012-10-05 MED ORDER — METOCLOPRAMIDE HCL 5 MG/ML IJ SOLN
10.0000 mg | Freq: Once | INTRAMUSCULAR | Status: AC
  Administered 2012-10-05: 10 mg via INTRAVENOUS
  Filled 2012-10-05: qty 2

## 2012-10-05 MED ORDER — KETOROLAC TROMETHAMINE 30 MG/ML IJ SOLN
15.0000 mg | Freq: Once | INTRAMUSCULAR | Status: AC
  Administered 2012-10-05: 15 mg via INTRAVENOUS
  Filled 2012-10-05: qty 1

## 2012-10-05 MED ORDER — DIPHENHYDRAMINE HCL 50 MG/ML IJ SOLN
25.0000 mg | Freq: Once | INTRAMUSCULAR | Status: AC
  Administered 2012-10-05: 25 mg via INTRAVENOUS
  Filled 2012-10-05: qty 1

## 2012-10-05 NOTE — ED Notes (Signed)
Malachi Bonds, EMT ambulated with pt back from triage and brought EKG with her and left for Dr. Juleen China

## 2012-10-05 NOTE — ED Notes (Signed)
Pt is here with headache that started 3 days ago with history of migraines.  Pt reports dizziness and nausea-- these are new symptoms with migraines.

## 2012-10-05 NOTE — ED Notes (Signed)
Pt also reports RUQ pain that started the other evening

## 2012-10-05 NOTE — ED Notes (Signed)
Dr. Kohut at bedside 

## 2012-10-05 NOTE — ED Notes (Signed)
Pt has been instructed not to drive due to possibly drowsiness of medication given in ED; pt endorses that she will not be driving and son states they have a ride coming; pt alert and mentating appropriately upon d/c teaching given; pt verbalizes understanding of d/c teaching and has no further questions upon d/c.

## 2012-10-07 NOTE — ED Provider Notes (Signed)
History    42 year old female with headache. Patient has a history of what she calls migraines and says that her current symptoms feel similar to previous. Denies any trauma. That was recently started about 3 days ago. Gradual onset. Diffuse and achy. No appreciable exacerbating relieving factors. Associated with dizziness and nausea. No vomiting. No acute numbness, tingling or loss strength. No visual complaints. She is also complaining of intermittent right upper quadrant pain. Often worse after eating. No radiation.   CSN: [id number]  Arrival date & time 10/05/12  1100   First MD Initiated Contact with Patient 10/05/12 1111      Chief Complaint  Patient presents with  . Headache  . Dizziness    (Consider location/radiation/quality/duration/timing/severity/associated sxs/prior treatment) HPI  Past Medical History  Diagnosis Date  . Anemia   . Anxiety   . LSIL (low grade squamous intraepithelial lesion) on Pap smear 08/21/2011  . Migraines   . Arthritis     Past Surgical History  Procedure Laterality Date  . Tubal ligation  1993  . Dilation and curretage      after a miscarriage  . Endometrial ablation    . Laparoscopy  03/19/2011    Procedure: LAPAROSCOPY OPERATIVE;  Surgeon: Hollie Salk C. Marice Potter, MD;  Location: [facility];  Service: Gynecology;  Laterality: N/A;  . Novasure ablation  03/19/2011    Procedure: NOVASURE ABLATION;  Surgeon: Hollie Salk C. Marice Potter, MD;  Location: [facility];  Service: Gynecology;  Laterality: N/A;    Family History  Problem Relation Age of Onset  . Diabetes Mother   . Hypertension Mother   . Hyperlipidemia Mother   . Diabetes Maternal Aunt   . Cancer Maternal Aunt     breast, metastatic cancer throughout body  . Cancer Maternal Uncle     leukemia  . Diabetes Maternal Grandmother   . Cancer Maternal Uncle     colon    History  Substance Use Topics  . Smoking status: Former Smoker -- 2.00 packs/day for 10 years    Types: Cigarettes    Quit date: 05/03/2008   . Smokeless tobacco: Never Used  . Alcohol Use: No    OB History   Grav Para Term Preterm Abortions TAB SAB Ect Mult Living   4 3 3  1  1   3       Review of Systems  All systems reviewed and negative, other than as noted in HPI.   Allergies  Imitrex  Home Medications   Current Outpatient Rx  Name  Route  Sig  Dispense  Refill  . ALPRAZolam (XANAX) 1 MG tablet   Oral   Take 0.5-1 mg by mouth 2 (two) times daily as needed. For anxiety/sleep         . meloxicam (MOBIC) 15 MG tablet   Oral   Take 15 mg by mouth daily.         . traMADol (ULTRAM) 50 MG tablet   Oral   Take 50 mg by mouth every 6 (six) hours as needed. For knee pain           BP 109/67  Pulse 70  Temp(Src) 98.1 F (36.7 C) (Axillary)  Resp 16  SpO2 100%  Physical Exam  Nursing note and vitals reviewed. Constitutional: She appears well-developed and well-nourished. No distress.  HENT:  Head: Normocephalic and atraumatic.  Eyes: Conjunctivae are normal. Right eye exhibits no discharge. Left eye exhibits no discharge.  Neck: Neck supple.  No nuchal rigidity  Cardiovascular:  Normal rate, regular rhythm and normal heart sounds.  Exam reveals no gallop and no friction rub.   No murmur heard. Pulmonary/Chest: Effort normal and breath sounds normal. No respiratory distress.  Abdominal: Soft. She exhibits no distension. There is tenderness.  ruq tenderness w/o rebound or guarding  Musculoskeletal: She exhibits no edema and no tenderness.  Neurological: She is alert. No cranial nerve deficit. She exhibits normal muscle tone. Coordination normal.  Skin: Skin is warm and dry.  Psychiatric: She has a normal mood and affect. Her behavior is normal. Thought content normal.    ED Course  Procedures (including critical care time)  Labs Reviewed  COMPREHENSIVE METABOLIC PANEL - Abnormal; Notable for the following:    Albumin 3.4 (*)    All other components within normal limits  URINALYSIS, ROUTINE  W REFLEX MICROSCOPIC - Abnormal; Notable for the following:    Hgb urine dipstick SMALL (*)    Leukocytes, UA SMALL (*)    All other components within normal limits  URINE MICROSCOPIC-ADD ON - Abnormal; Notable for the following:    Squamous Epithelial / LPF FEW (*)    All other components within normal limits  CBC WITH DIFFERENTIAL  LIPASE, BLOOD   No results found.  US Abdomen Complete  10/05/2012  *RADIOLOGY REPORT*  Clinical Data:  Right upper quadrant pain.  ABDOMINAL ULTRASOUND COMPLETE  Comparison:  None.  Findings:  Gallbladder:  No gallstones, gallbladder wall thickening, or pericholecystic fluid.  Common Bile Duct:  Within normal limits in caliber. Measures 4 mm in diameter.  Liver: No focal mass lesion identified.  Within normal limits in parenchymal echogenicity.  IVC:  Appears normal.  Pancreas:  No abnormality identified.  Spleen:  Within normal limits in size and echotexture.  Right kidney:  Normal in size and parenchymal echogenicity.  No evidence of mass or hydronephrosis.  Incidental note is made of a tiny 1 cm cyst in the anterior mid pole.  Left kidney:  Normal in size and parenchymal echogenicity.  No evidence of mass or hydronephrosis.  Abdominal Aorta:  No aneurysm identified.  IMPRESSION: Unremarkable abdominal ultrasound.  No significant abnormality identified.   Original Report Authenticated By: Myles Rosenthal, M.D.    EKG:  Rhythm: normal sinus Vent. rate 67 BPM PR interval 122 ms QRS duration 76 ms QT/QTc 384/405 ms ST segments: NS ST changes   1. Headache   2. RUQ pain       MDM  41yF with HA. Suspect primary HA. Consider emergent secondary causes such as bleed, infectious or mass but doubt. There is no history of trauma. Pt has a nonfocal neurological exam. Afebrile and neck supple. No use of blood thinning medication. Consider ocular etiology such as acute angle closure glaucoma but doubt. Pt denies acute change in visual acuity and eye exam unremarkable.  Doubt temporal arteritis given age, no temporal tenderness and temporal artery pulsations palpable. Doubt CO poisoning. No contacts with similar symptoms. Doubt venous thrombosis. Doubt carotid or vertebral arteries dissection. Symptoms improved with meds. RUQ pain but unremarkable w/u including [place]. Feel that can be safely discharged, but strict return precautions discussed. Outpt fu.         Raeford Razor, MD 10/07/12 1517

## 2012-10-21 ENCOUNTER — Encounter: Payer: Self-pay | Admitting: Family

## 2012-10-21 ENCOUNTER — Ambulatory Visit (INDEPENDENT_AMBULATORY_CARE_PROVIDER_SITE_OTHER): Payer: Self-pay | Admitting: Family

## 2012-10-21 VITALS — BP 130/83 | HR 87 | Temp 97.0°F | Ht 63.0 in | Wt 209.6 lb

## 2012-10-21 DIAGNOSIS — R6889 Other general symptoms and signs: Secondary | ICD-10-CM

## 2012-10-21 DIAGNOSIS — IMO0002 Reserved for concepts with insufficient information to code with codable children: Secondary | ICD-10-CM

## 2012-10-21 NOTE — Progress Notes (Signed)
  Subjective:     Gloria Hall is a 42 y.o. female here for a repeat pap smear.  Current complaints: abdominal pain.  Personal health questionnaire reviewed: yes.  -June 2012-Pap, LGSIL. August 2012-laparoscopy and ablation, endometriosis seen. Started on OCPs for endometrosis symptoms, but stopped b/c she didn't like how they made her feel. Jan 2013-Pap, normal. Was told to have repeat pap in 6 months, but has not had Pap since then  -Menstrual periods lighter since ablation, now 4-5d long using only 1 pantiliner per day.   -Endometrial pain occurs 1-2x/week. Has tried Pepto and Tramadol with no relief. Thinks she may also have diverticulitis and has appt with PCP on 10/29/12 to evaluate for this. States she can tell endometrial pain apart from GI pain, and most pain is GI related  -C/o moodiness, fatigue, low libido, vaginal dryness. CBC in March 2014 normal.     Gynecologic History Patient's last menstrual period was 10/05/2012. Contraception: tubal ligation Last Pap: Jan 2013. Results were: normal.  Last mammogram: October 2013. Results were: normal  Obstetric History OB History   Grav Para Term Preterm Abortions TAB SAB Ect Mult Living   4 3 3  1  1   3      # Outc Date GA Lbr Len/2nd Wgt Sex Del Anes PTL Lv   1 TRM            2 TRM            3 TRM            4 SAB                Review of Systems Constitutional: positive for fatigue Cardiovascular: negative Gastrointestinal: positive for abdominal pain Genitourinary:positive for vaginal dryness    Objective:    General appearance: alert, cooperative, fatigued and no distress Lungs: clear to auscultation bilaterally Breasts: normal appearance, no masses or tenderness, No nipple discharge or bleeding, No axillary or supraclavicular adenopathy, Normal to palpation without dominant masses Heart: regular rate and rhythm, S1, S2 normal, no murmur, click, rub or gallop Abdomen: soft, non-tender; bowel sounds normal; no  masses,  no organomegaly Pelvic: cervix normal in appearance, external genitalia normal, no adnexal masses or tenderness, no cervical motion tenderness, rectovaginal septum normal, uterus normal size, shape, and consistency and vagina normal without discharge    Assessment:    Healthy female exam.    Plan:    Education reviewed: endometriosis, perimenopause. Follow up in: 6 months.

## 2013-02-19 ENCOUNTER — Encounter (HOSPITAL_COMMUNITY): Payer: Self-pay | Admitting: Nurse Practitioner

## 2013-02-19 ENCOUNTER — Emergency Department (HOSPITAL_COMMUNITY)
Admission: EM | Admit: 2013-02-19 | Discharge: 2013-02-19 | Disposition: A | Discharge status: Home / Self Care | Payer: BC Managed Care – PPO | Attending: Emergency Medicine | Admitting: Emergency Medicine

## 2013-02-19 DIAGNOSIS — Z862 Personal history of diseases of the blood and blood-forming organs and certain disorders involving the immune mechanism: Secondary | ICD-10-CM | POA: Insufficient documentation

## 2013-02-19 DIAGNOSIS — R5381 Other malaise: Secondary | ICD-10-CM | POA: Insufficient documentation

## 2013-02-19 DIAGNOSIS — M129 Arthropathy, unspecified: Secondary | ICD-10-CM | POA: Insufficient documentation

## 2013-02-19 DIAGNOSIS — R42 Dizziness and giddiness: Secondary | ICD-10-CM | POA: Insufficient documentation

## 2013-02-19 DIAGNOSIS — R110 Nausea: Secondary | ICD-10-CM | POA: Insufficient documentation

## 2013-02-19 DIAGNOSIS — Z87891 Personal history of nicotine dependence: Secondary | ICD-10-CM | POA: Insufficient documentation

## 2013-02-19 DIAGNOSIS — G43909 Migraine, unspecified, not intractable, without status migrainosus: Secondary | ICD-10-CM

## 2013-02-19 DIAGNOSIS — F411 Generalized anxiety disorder: Secondary | ICD-10-CM | POA: Insufficient documentation

## 2013-02-19 DIAGNOSIS — Z79899 Other long term (current) drug therapy: Secondary | ICD-10-CM | POA: Insufficient documentation

## 2013-02-19 MED ORDER — METOCLOPRAMIDE HCL 5 MG/ML IJ SOLN
10.0000 mg | Freq: Once | INTRAMUSCULAR | Status: AC
  Administered 2013-02-19: 10 mg via INTRAVENOUS
  Filled 2013-02-19: qty 2

## 2013-02-19 MED ORDER — OXYCODONE-ACETAMINOPHEN 5-325 MG PO TABS: 1.0000 | ORAL_TABLET | Freq: Four times a day (QID) | ORAL | Status: DC | PRN

## 2013-02-19 MED ORDER — KETOROLAC TROMETHAMINE 30 MG/ML IJ SOLN
15.0000 mg | Freq: Once | INTRAMUSCULAR | Status: AC
  Administered 2013-02-19: 15 mg via INTRAVENOUS
  Filled 2013-02-19: qty 1

## 2013-02-19 MED ORDER — ONDANSETRON 4 MG PO TBDP: 4.0000 mg | ORAL_TABLET | Freq: Three times a day (TID) | ORAL | Status: DC | PRN

## 2013-02-19 MED ORDER — ONDANSETRON HCL 4 MG PO TABS: 4.0000 mg | ORAL_TABLET | Freq: Three times a day (TID) | ORAL | Status: DC | PRN

## 2013-02-19 MED ORDER — DIPHENHYDRAMINE HCL 50 MG/ML IJ SOLN
25.0000 mg | Freq: Once | INTRAMUSCULAR | Status: AC
  Administered 2013-02-19: 25 mg via INTRAVENOUS
  Filled 2013-02-19: qty 1

## 2013-02-19 MED ORDER — SODIUM CHLORIDE 0.9 % IV BOLUS (SEPSIS)
1000.0000 mL | Freq: Once | INTRAVENOUS | Status: AC
  Administered 2013-02-19: 1000 mL via INTRAVENOUS

## 2013-02-19 NOTE — ED Provider Notes (Signed)
History    CSN: [id number] Arrival date & time 02/19/13  1117  First MD Initiated Contact with Patient 02/19/13 1156     Chief Complaint  Patient presents with  . Migraine   (Consider location/radiation/quality/duration/timing/severity/associated sxs/prior Treatment) HPI Comments: [name] is a 42 yo female with a PMH of migraines who presents to the ED with complaint of 1010 frontal headache since waking this AM.  She says she had a frontal headache 5/10 for the past two weeks for which she has taken OTC ibuprofen without relief.  She describes the headache as at the front of her head and behind the eyes.  She denies recent head trauma.  She denies known triggers or any relation to her menstrual cycle.  She took 600mg  of OTC ibuprofen this AM without relief.  She also is experiencing nausea, dizziness, photophobia and phonophobia.  She denies vomiting, fever, cold symptoms, CP/SOB, constipation/diarrhea, dysuria.  She says she usually gets headaches leading up to a migraine and this feels the same as previous episodes.  She usually comes to the ED for treatment when they occur.  She follows with a PCP but has never seen a neurologist.  She reports having tried Imitrex in the past but says she was allergic.  She took Fiorecet years ago but not recently.  Her last migraine was 4 months ago at which time she was treated in the ED.   Patient is a 42 y.o. female presenting with migraines.  Migraine Associated symptoms include fatigue and headaches. Pertinent negatives include no chest pain, chills, fever or neck pain.   Past Medical History  Diagnosis Date  . Anemia   . Anxiety   . LSIL (low grade squamous intraepithelial lesion) on Pap smear 08/21/2011  . Migraines   . Arthritis    Past Surgical History  Procedure Laterality Date  . Tubal ligation  1993  . Dilation and curretage      after a miscarriage  . Endometrial ablation    . Laparoscopy  03/19/2011    Procedure: LAPAROSCOPY  OPERATIVE;  Surgeon: Hollie Salk C. Marice Potter, MD;  Location: [facility];  Service: Gynecology;  Laterality: N/A;  . Novasure ablation  03/19/2011    Procedure: NOVASURE ABLATION;  Surgeon: Hollie Salk C. Marice Potter, MD;  Location: [facility];  Service: Gynecology;  Laterality: N/A;   Family History  Problem Relation Age of Onset  . Diabetes Mother   . Hypertension Mother   . Hyperlipidemia Mother   . Diabetes Maternal Aunt   . Cancer Maternal Aunt     breast, metastatic cancer throughout body  . Cancer Maternal Uncle     leukemia  . Diabetes Maternal Grandmother   . Cancer Maternal Uncle     colon   History  Substance Use Topics  . Smoking status: Former Smoker -- 2.00 packs/day for 10 years    Types: Cigarettes    Quit date: 05/03/2008  . Smokeless tobacco: Never Used  . Alcohol Use: No   OB History   Grav Para Term Preterm Abortions TAB SAB Ect Mult Living   4 3 3  1  1   3      Review of Systems  Constitutional: Positive for fatigue. Negative for fever and chills.  HENT: Negative for neck pain and neck stiffness.   Eyes: Positive for photophobia.  Respiratory: Negative for shortness of breath.   Cardiovascular: Negative for chest pain.  Gastrointestinal:       Negative for change in bowel habits.  Endocrine: Negative for polyuria.  Genitourinary: Negative for dysuria and hematuria.  Neurological: Positive for dizziness and headaches.    Allergies  Imitrex  Home Medications   Current Outpatient Rx  Name  Route  Sig  Dispense  Refill  . ALPRAZolam (XANAX) 1 MG tablet   Oral   Take 0.5-1 mg by mouth 2 (two) times daily as needed. For anxiety/sleep         . meloxicam (MOBIC) 15 MG tablet   Oral   Take 15 mg by mouth daily.         . traMADol (ULTRAM) 50 MG tablet   Oral   Take 50 mg by mouth every 6 (six) hours as needed. For knee pain          BP 121/64  Pulse 84  Temp(Src) 98.3 F (36.8 C) (Oral)  Resp 14  Ht 5\' 3"  (1.6 m)  Wt 220 lb (99.791 kg)  BMI 38.98 kg/m2  SpO2  98% Physical Exam  Constitutional: She is oriented to person, place, and time. She appears well-developed and well-nourished. No distress.  HENT:  Head: Normocephalic and atraumatic.  Mouth/Throat: Oropharynx is clear and moist.  Eyes: Conjunctivae and EOM are normal. Pupils are equal, round, and reactive to light.  Neck: Normal range of motion. Neck supple.  Cardiovascular: Normal rate, regular rhythm and normal heart sounds.   Pulmonary/Chest: Effort normal and breath sounds normal.  Abdominal: Soft. Bowel sounds are normal. She exhibits no distension. There is no tenderness.  Musculoskeletal: Normal range of motion.  Neurological: She is alert and oriented to person, place, and time. No cranial nerve deficit. She exhibits normal muscle tone.  Skin: Skin is warm. She is not diaphoretic.  Psychiatric: She has a normal mood and affect. Her behavior is normal.    ED Course  Procedures (including critical care time) Labs Reviewed - No data to display No results found. No diagnosis found.  MDM  Pt presenting with headache that she feels is a migraine and says she has been diagnosed with the condition but on no chronic therapy.  Her symptoms of nausea, photophobia and phonophobia are consistent with such and she states this feels like her usual migraine.  Doubt infectious cause as she is without fever, nuchal rigidity, generalized headache, AMS and does not appear acutely ill.  Will treat with Reglan, diphenhydramine and ketoralac for now.  Will discuss with patient the importance of following up with her PCP regarding chronic management.  Evelena Peat, DO 02/19/13 1254

## 2013-02-19 NOTE — ED Notes (Signed)
Pt with headache x 2 weeks, unrelieved by OTC meds. Woke today and "it had turned into a migraine." reports light sensitivity, dizziness, nausea. A&Ox4, resp e/u

## 2013-02-19 NOTE — ED Provider Notes (Signed)
  I performed a history and physical examination of Gloria Hall and discussed her management with Dr. Andrey Campanile.  I agree with the history, physical, assessment, and plan of care, with the following exceptions: None  I was present for the following procedures: None Time Spent in Critical Care of the patient: None Time spent in discussions with the patient and family: 19  42 y.o. Female presents with migraines c.w. H.o. Migraine headache.  Normal neuro exam.  Patient improved here after iv meds.  Holli Humbles, MD 02/19/13 1459

## 2013-05-11 ENCOUNTER — Other Ambulatory Visit (HOSPITAL_COMMUNITY): Payer: Self-pay | Admitting: Internal Medicine

## 2013-05-11 DIAGNOSIS — Z1231 Encounter for screening mammogram for malignant neoplasm of breast: Secondary | ICD-10-CM

## 2013-05-13 ENCOUNTER — Ambulatory Visit: Payer: BC Managed Care – PPO | Admitting: Diagnostic Neuroimaging

## 2013-05-20 ENCOUNTER — Other Ambulatory Visit: Payer: Self-pay

## 2013-05-20 DIAGNOSIS — Z1231 Encounter for screening mammogram for malignant neoplasm of breast: Secondary | ICD-10-CM

## 2013-05-27 ENCOUNTER — Ambulatory Visit (INDEPENDENT_AMBULATORY_CARE_PROVIDER_SITE_OTHER): Payer: BC Managed Care – PPO | Admitting: Diagnostic Neuroimaging

## 2013-05-27 ENCOUNTER — Encounter: Payer: Self-pay | Admitting: Diagnostic Neuroimaging

## 2013-05-27 VITALS — BP 120/82 | HR 84 | Temp 98.0°F | Ht 63.0 in | Wt 228.0 lb

## 2013-05-27 DIAGNOSIS — M545 Low back pain, unspecified: Secondary | ICD-10-CM

## 2013-05-27 DIAGNOSIS — R209 Unspecified disturbances of skin sensation: Secondary | ICD-10-CM

## 2013-05-27 DIAGNOSIS — R200 Anesthesia of skin: Secondary | ICD-10-CM

## 2013-05-27 NOTE — Patient Instructions (Signed)
I will check lab testing.  Consider physical therapy evaluation.  Consider gabapentin or cymbalta; discuss with your primary care physician.

## 2013-05-27 NOTE — Progress Notes (Signed)
[facility]  PATIENT: Gloria Hall DOB: 1970-08-24  REFERRING CLINICIAN: Penni Bombard HISTORY FROM: patient REASON FOR VISIT: new consult   HISTORICAL  CHIEF COMPLAINT:  No chief complaint on file.   HISTORY OF PRESENT ILLNESS:   42 year old right-handed female here for evaluation of low back pain, left hip pain, left leg pain and bilateral foot numbness.  Symptoms have been going on for past one to 2 years. She has pain in her left sacroiliac joint region, left hip, radiating to her left leg and foot. She's been evaluated by orthopedic surgery, had MRI of the lumbar spine which been unremarkable. Patient is currently on hydrocodone, meloxicam, ibuprofen, amitriptyline per her PCP for pain control. She also has diffuse pain all over her body, possible fibromyalgia, and frequent migraine headaches. Patient denies any prior injuries. In 1993 when she was pregnant, she was told that her BP was laying on her left sided may have caused some nerve damage in her left leg. No pain in her right leg. No significant pain in her arms or neck.  Patient has had prior diagnosis of carpal tunnel syndrome and left hand, previously on gabapentin.  Patient is comfortable with her current medication regimen. She has tried some physical therapy exercises previously when she had a lumbar strain injury. She did not get much relief previously.  REVIEW OF SYSTEMS: Full 14 system review of systems performed and notable only for anxiety racing thoughts confusion headache numbness weakness dizziness insomnia restless legs feeling hot increased thirst joint pain joint swelling aching muscles and sensitivity shortness of breath fatigue swelling in legs ringing in ear spinning sensation.  ALLERGIES: Allergies  Allergen Reactions  . Imitrex [Sumatriptan] Shortness Of Breath    HOME MEDICATIONS: Outpatient Prescriptions Prior to Visit  Medication Sig Dispense Refill  . ALPRAZolam (XANAX) 1 MG  tablet Take 0.5-1 mg by mouth 2 (two) times daily as needed. For anxiety/sleep      . amitriptyline (ELAVIL) 25 MG tablet Take 50 mg by mouth at bedtime.       Marland Kitchen HYDROcodone-acetaminophen (NORCO/VICODIN) 5-325 MG per tablet Take 1 tablet by mouth every 6 (six) hours as needed for pain.      Marland Kitchen ibuprofen (ADVIL,MOTRIN) 200 MG tablet Take 600 mg by mouth every 6 (six) hours as needed for pain.      Marland Kitchen ondansetron (ZOFRAN) 4 MG tablet Take 1 tablet (4 mg total) by mouth every 8 (eight) hours as needed for nausea.  20 tablet  0  . meloxicam (MOBIC) 15 MG tablet Take 15 mg by mouth daily.       No facility-administered medications prior to visit.    PAST MEDICAL HISTORY: Past Medical History  Diagnosis Date  . Anemia   . Anxiety   . LSIL (low grade squamous intraepithelial lesion) on Pap smear 08/21/2011  . Migraines   . Arthritis     PAST SURGICAL HISTORY: Past Surgical History  Procedure Laterality Date  . Tubal ligation  1993  . Dilation and curretage      after a miscarriage  . Endometrial ablation    . Laparoscopy  03/19/2011    Procedure: LAPAROSCOPY OPERATIVE;  Surgeon: Hollie Salk C. Marice Potter, MD;  Location: [facility];  Service: Gynecology;  Laterality: N/A;  . Novasure ablation  03/19/2011    Procedure: NOVASURE ABLATION;  Surgeon: Hollie Salk C. Marice Potter, MD;  Location: [facility];  Service: Gynecology;  Laterality: N/A;    FAMILY HISTORY: Family History  Problem Relation Age of Onset  .  Diabetes Mother   . Hypertension Mother   . Hyperlipidemia Mother   . Fibromyalgia Mother   . Sleep apnea Mother   . Diabetes Maternal Aunt   . Cancer Maternal Aunt     breast, metastatic cancer throughout body  . Breast cancer Maternal Aunt   . Crohn's disease Maternal Aunt   . Cancer Maternal Uncle     leukemia  . Diabetes Maternal Grandmother   . Cancer Maternal Uncle     colon  . Heart attack Father     SOCIAL HISTORY:  History   Social History  . Marital Status: Married    Spouse Name: Grabiela Wohlford    Number of Children: 3  . Years of Education: 12th   Occupational History  .      [employer]   Social History Main Topics  . Smoking status: Former Smoker -- 2.00 packs/day for 10 years    Types: Cigarettes    Quit date: 05/03/2009  . Smokeless tobacco: Never Used  . Alcohol Use: No     Comment: quit in 2008  . 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   Marland Kitchen Sexual Activity: Yes    Birth Control/ Protection: Surgical   Other Topics Concern  . Not on file   Social History Narrative   Patient lives at home with spouse.   Caffeine Use: 2 cups of coffee and 2 cups of tea daily     PHYSICAL EXAM  Filed Vitals:   05/27/13 0902  BP: 120/82  Pulse: 84  Temp: 98 F (36.7 C)  TempSrc: Oral  Height: 5\' 3"  (1.6 m)  Weight: 228 lb (103.42 kg)    Not recorded    Body mass index is 40.4 kg/(m^2).  GENERAL EXAM: Patient is in no distress; STRAIGHT LEG RAISE POSITIVE IN LEFT > RIGHT LEGS. FABER TESTING POSITIVE BILATERALLY.  CARDIOVASCULAR: Regular rate and rhythm, no murmurs, no carotid bruits  NEUROLOGIC: MENTAL STATUS: awake, alert, language fluent, comprehension intact, naming intact CRANIAL NERVE: no papilledema on fundoscopic exam, pupils equal and reactive to light, visual fields full to confrontation, extraocular muscles intact, no nystagmus, facial sensation and strength symmetric, uvula midline, shoulder shrug symmetric, tongue midline. MOTOR: normal bulk and tone, full strength in the BUE, BLE SENSORY: normal and symmetric to light touch, pinprick, temperature, vibration COORDINATION: finger-nose-finger, fine finger movements normal REFLEXES: deep tendon reflexes present and symmetric; TRACE IN ANKLES. GAIT/STATION: narrow based gait; able to walk on toes and tandem; DIFF WITH HEEL WALKING. Romberg is negative   DIAGNOSTIC DATA (LABS, IMAGING, TESTING) - I reviewed patient records, labs, notes,  testing and imaging myself where available.  Lab Results  Component Value Date   WBC 7.6 10/05/2012   HGB 12.2 10/05/2012   HCT 37.9 10/05/2012   MCV 80.8 10/05/2012   PLT 323 10/05/2012      Component Value Date/Time   NA 136 10/05/2012 1130   K 4.2 10/05/2012 1130   CL 102 10/05/2012 1130   CO2 26 10/05/2012 1130   GLUCOSE 91 10/05/2012 1130   BUN 7 10/05/2012 1130   CREATININE 0.58 10/05/2012 1130   CALCIUM 9.3 10/05/2012 1130   PROT 7.3 10/05/2012 1130   ALBUMIN 3.4* 10/05/2012 1130   AST 15 10/05/2012 1130   ALT 10 10/05/2012 1130   ALKPHOS 70 10/05/2012 1130   BILITOT 0.5 10/05/2012 1130   GFRNONAA >90  10/05/2012 1130   GFRAA >90 10/05/2012 1130   No results found for this basename: CHOL, HDL, LDLCALC, LDLDIRECT, TRIG, CHOLHDL   No results found for this basename: HGBA1C   No results found for this basename: VITAMINB12   Lab Results  Component Value Date   TSH 3.447 10/16/2010    04/05/13 MRI lumbar spine - no spinal stenosis or foraminal narrowing    ASSESSMENT AND PLAN  42 y.o. year old female here with low back pain, left hip pain, left leg pain, bilateral foot numbness. Neurologic exam notable only for decreased sensation in the feet to pinprick, with trace ankle jerk reflexes. Patient may have underlying peripheral neuropathy. Patient has decreased range of motion and straight leg raise testing with some reproduction of pain. She may have some muscle skeletal cause of pain as well. No evidence of lumbar radiculopathy on MRI of the lumbar spine. Other aggravating factors include underlying depression, anxiety, possible fibromyalgia.  Ddx: neuropathy, osteoarthritis, fibromyalgia, pain syndrome  PLAN: - neuropathy labs - consider gabapentin or cymbalta (patient declines at this time) - continue physical therapy exercises (patient does these at work and at home)   Orders Placed This Encounter  Procedures  . Neuropathy Panel   Return in about 6 months (around 11/25/2013) for with Edison Nasuti, MD 05/27/2013, 9:55 AM Certified in Neurology, Neurophysiology and Neuroimaging  [facility] [address], [address] [place], [place] [address] [phone]

## 2013-05-30 LAB — NEUROPATHY PANEL
A/G Ratio: 1.2 (ref 0.7–2.0)
Albumin ELP: 3.6 g/dL (ref 3.2–5.6)
Alpha 1: 0.2 g/dL (ref 0.1–0.4)
Anti Nuclear Antibody(ANA): NEGATIVE
Gamma Globulin: 1.1 g/dL (ref 0.5–1.6)
Rhuematoid fact SerPl-aCnc: 10 IU/mL (ref 0.0–13.9)
TSH: 2.08 u[IU]/mL (ref 0.450–4.500)
Vitamin B-12: 267 pg/mL (ref 211–946)

## 2013-07-04 ENCOUNTER — Ambulatory Visit
Admission: RE | Admit: 2013-07-04 | Discharge: 2013-07-04 | Disposition: A | Discharge status: Home / Self Care | Payer: BC Managed Care – PPO | Source: Ambulatory Visit

## 2013-07-04 DIAGNOSIS — Z1231 Encounter for screening mammogram for malignant neoplasm of breast: Secondary | ICD-10-CM

## 2013-07-14 ENCOUNTER — Emergency Department (HOSPITAL_COMMUNITY)
Admission: EM | Admit: 2013-07-14 | Discharge: 2013-07-14 | Disposition: A | Discharge status: Home / Self Care | Payer: BC Managed Care – PPO | Attending: Emergency Medicine | Admitting: Emergency Medicine

## 2013-07-14 ENCOUNTER — Encounter (HOSPITAL_COMMUNITY): Payer: Self-pay | Admitting: Emergency Medicine

## 2013-07-14 ENCOUNTER — Telehealth: Payer: Self-pay | Admitting: Diagnostic Neuroimaging

## 2013-07-14 DIAGNOSIS — Z79899 Other long term (current) drug therapy: Secondary | ICD-10-CM | POA: Insufficient documentation

## 2013-07-14 DIAGNOSIS — G43909 Migraine, unspecified, not intractable, without status migrainosus: Secondary | ICD-10-CM | POA: Insufficient documentation

## 2013-07-14 DIAGNOSIS — R519 Headache, unspecified: Secondary | ICD-10-CM

## 2013-07-14 DIAGNOSIS — Z862 Personal history of diseases of the blood and blood-forming organs and certain disorders involving the immune mechanism: Secondary | ICD-10-CM | POA: Insufficient documentation

## 2013-07-14 DIAGNOSIS — F411 Generalized anxiety disorder: Secondary | ICD-10-CM | POA: Insufficient documentation

## 2013-07-14 DIAGNOSIS — Z87891 Personal history of nicotine dependence: Secondary | ICD-10-CM | POA: Insufficient documentation

## 2013-07-14 DIAGNOSIS — Z791 Long term (current) use of non-steroidal anti-inflammatories (NSAID): Secondary | ICD-10-CM | POA: Insufficient documentation

## 2013-07-14 DIAGNOSIS — M129 Arthropathy, unspecified: Secondary | ICD-10-CM | POA: Insufficient documentation

## 2013-07-14 MED ORDER — DIPHENHYDRAMINE HCL 50 MG/ML IJ SOLN
25.0000 mg | Freq: Once | INTRAMUSCULAR | Status: AC
  Administered 2013-07-14: 25 mg via INTRAVENOUS
  Filled 2013-07-14: qty 1

## 2013-07-14 MED ORDER — METOCLOPRAMIDE HCL 5 MG/ML IJ SOLN
10.0000 mg | Freq: Once | INTRAMUSCULAR | Status: AC
  Administered 2013-07-14: 10 mg via INTRAVENOUS
  Filled 2013-07-14: qty 2

## 2013-07-14 MED ORDER — KETOROLAC TROMETHAMINE 30 MG/ML IJ SOLN
30.0000 mg | Freq: Once | INTRAMUSCULAR | Status: AC
  Administered 2013-07-14: 30 mg via INTRAVENOUS
  Filled 2013-07-14 (×2): qty 1

## 2013-07-14 MED ORDER — SODIUM CHLORIDE 0.9 % IV BOLUS (SEPSIS)
1000.0000 mL | Freq: Once | INTRAVENOUS | Status: AC
  Administered 2013-07-14: 1000 mL via INTRAVENOUS

## 2013-07-14 NOTE — ED Notes (Signed)
C/o migraine & nausea, no emesis this morning. Has history of same.

## 2013-07-14 NOTE — ED Provider Notes (Signed)
CSN: [id number]     Arrival date & time 07/14/13  1914 History   First MD Initiated Contact with Patient 07/14/13 0901     Chief Complaint  Patient presents with  . Migraine   (Consider location/radiation/quality/duration/timing/severity/associated sxs/prior Treatment) HPI Comments: Patient presents with gradual onset of frontal headache similar to previous migraines. This got worse since yesterday. Denies thunderclap onset or fever. Denies any blurry vision or double vision. Endorses photophobia and nausea. No vomiting. No fever. Denies any change from chronic migraine pattern. She does not take anything at home. Denies any focal weakness, numbness or tingling.  The history is provided by the patient.    Past Medical History  Diagnosis Date  . Anemia   . Anxiety   . LSIL (low grade squamous intraepithelial lesion) on Pap smear 08/21/2011  . Migraines   . Arthritis    Past Surgical History  Procedure Laterality Date  . Tubal ligation  1993  . Dilation and curretage      after a miscarriage  . Endometrial ablation    . Laparoscopy  03/19/2011    Procedure: LAPAROSCOPY OPERATIVE;  Surgeon: Hollie Salk C. Marice Potter, MD;  Location: [facility];  Service: Gynecology;  Laterality: N/A;  . Novasure ablation  03/19/2011    Procedure: NOVASURE ABLATION;  Surgeon: Hollie Salk C. Marice Potter, MD;  Location: [facility];  Service: Gynecology;  Laterality: N/A;   Family History  Problem Relation Age of Onset  . Diabetes Mother   . Hypertension Mother   . Hyperlipidemia Mother   . Fibromyalgia Mother   . Sleep apnea Mother   . Diabetes Maternal Aunt   . Cancer Maternal Aunt     breast, metastatic cancer throughout body  . Breast cancer Maternal Aunt   . Crohn's disease Maternal Aunt   . Cancer Maternal Uncle     leukemia  . Diabetes Maternal Grandmother   . Cancer Maternal Uncle     colon  . Heart attack Father    History  Substance Use Topics  . Smoking status: Former Smoker -- 2.00 packs/day for 10 years   Types: Cigarettes    Quit date: 05/03/2009  . Smokeless tobacco: Never Used  . Alcohol Use: No     Comment: quit in 2008   OB History   Grav Para Term Preterm Abortions TAB SAB Ect Mult Living   4 3 3  1  1   3      Review of Systems  Constitutional: Positive for activity change and appetite change. Negative for fever and fatigue.  HENT: Negative for sore throat.   Eyes: Positive for photophobia. Negative for visual disturbance.  Respiratory: Negative for cough, chest tightness and shortness of breath.   Cardiovascular: Negative for chest pain.  Gastrointestinal: Positive for nausea. Negative for vomiting and abdominal pain.  Genitourinary: Negative for dysuria, hematuria, vaginal bleeding and vaginal discharge.  Musculoskeletal: Negative for back pain, neck pain and neck stiffness.  Skin: Negative for rash.  Neurological: Positive for headaches. Negative for dizziness, weakness, light-headedness and numbness.  A complete 10 system review of systems was obtained and all systems are negative except as noted in the HPI and PMH.    Allergies  Imitrex  Home Medications   Current Outpatient Rx  Name  Route  Sig  Dispense  Refill  . ALPRAZolam (XANAX) 0.5 MG tablet   Oral   Take 0.5 mg by mouth 2 (two) times daily as needed for anxiety.         Marland Kitchen  amitriptyline (ELAVIL) 50 MG tablet   Oral   Take 50 mg by mouth at bedtime.         . dicyclomine (BENTYL) 10 MG capsule   Oral   Take 10 mg by mouth 2 (two) times daily.         Marland Kitchen HYDROcodone-acetaminophen (NORCO/VICODIN) 5-325 MG per tablet   Oral   Take 1 tablet by mouth every 6 (six) hours as needed for pain.         . naproxen (NAPROSYN) 500 MG tablet   Oral   Take 500 mg by mouth 2 (two) times daily with a meal.         . omeprazole (PRILOSEC) 20 MG capsule   Oral   Take 20 mg by mouth daily.          BP 103/53  Pulse 77  Temp(Src) 98.3 F (36.8 C) (Oral)  Resp 18  Ht 5\' 3"  (1.6 m)  Wt 229 lb  (103.874 kg)  BMI 40.58 kg/m2  SpO2 97%  LMP 07/06/2013 Physical Exam  Constitutional: She is oriented to person, place, and time. She appears well-developed and well-nourished. No distress.  HENT:  Head: Normocephalic and atraumatic.  Mouth/Throat: Oropharynx is clear and moist. No oropharyngeal exudate.  Eyes: Conjunctivae and EOM are normal. Pupils are equal, round, and reactive to light.  Neck: Normal range of motion. Neck supple.  No meningismus  Cardiovascular: Normal rate, regular rhythm and normal heart sounds.   Pulmonary/Chest: Effort normal and breath sounds normal. No respiratory distress.  Abdominal: Soft. There is no tenderness. There is no rebound and no guarding.  Musculoskeletal: Normal range of motion. She exhibits no edema and no tenderness.  Neurological: She is alert and oriented to person, place, and time. No cranial nerve deficit. She exhibits normal muscle tone. Coordination normal.  CN 2-12 intact, no ataxia on finger to nose, no nystagmus, 5/5 strength throughout, no pronator drift, Romberg negative, normal gait.   Skin: Skin is warm.    ED Course  Procedures (including critical care time) Labs Review Labs Reviewed - No data to display Imaging Review No results found.  EKG Interpretation   None       MDM   1. Headache    2 day History of gradual onset headache and nausea similar to previous migraines. No fever, chills, vomiting. Denies thunderclap onset.  Neurologically intact. No meningismus. Patient given headache cocktail of IV fluids, Benadryl, Reglan and Toradol.  This improved her headache. She is feeling better and requesting to go home. Suspect migraine headache similar to previous. Doubt meningitis or subarachnoid hemorrhage. No red flags symptom. Stable for followup with PCP and neurology.    Glynn Octave, MD 07/14/13 [phone]

## 2013-07-15 ENCOUNTER — Telehealth: Payer: Self-pay | Admitting: Diagnostic Neuroimaging

## 2013-07-15 NOTE — Telephone Encounter (Signed)
I called [facility] and provided Lab results and recommendations, per Dr. Marjory Lies. Patient had no questions.

## 2013-10-27 ENCOUNTER — Encounter: Payer: Self-pay | Admitting: Obstetrics & Gynecology

## 2013-10-27 ENCOUNTER — Ambulatory Visit (INDEPENDENT_AMBULATORY_CARE_PROVIDER_SITE_OTHER): Payer: BC Managed Care – PPO | Admitting: Obstetrics & Gynecology

## 2013-10-27 VITALS — BP 117/84 | HR 85 | Temp 98.4°F | Ht 63.0 in | Wt 236.6 lb

## 2013-10-27 DIAGNOSIS — Z01419 Encounter for gynecological examination (general) (routine) without abnormal findings: Secondary | ICD-10-CM

## 2013-10-27 NOTE — Patient Instructions (Signed)
Abnormal Pap Test Information During a Pap test, the cells on the surface of your cervix are checked to see if they look normal, abnormal, or if they show signs of having been altered by a certain type of virus called human papillomavirus, or HPV. Cervical cells that have been affected by HPV are called dysplasia. Dysplasia is not cancer, but describes abnormal cells found on the surface of the cervix. Depending on the degree of dysplasia, some of the cells may be considered pre-cancerous and may turn into cancer over time if follow up with a caregiver is delayed.  WHAT DOES AN ABNORMAL PAP TEST MEAN? Having an abnormal pap test does not mean that you have cancer. However, certain types of abnormal pap tests can be a sign that a person is at a higher risk of developing cancer. Your caregiver will want to do other tests to find out more about the abnormal cells. Your abnormal Pap test results could show:   Small and uncertain changes that should be carefully watched.   Cervical dysplasia that has caused mild changes and can be followed over time.  Cervical dysplasia that is more severe and needs to be followed and treated to ensure the problem goes away.  Cancer.  When severe cervical dysplasia is found and treated early, it rarely will grow into cancer.  WHAT WILL BE DONE ABOUT MY ABNORMAL PAP TEST?  A colposcopy may be needed. This is a procedure where your cervix is examined using light and magnification.  A small tissue sample of your cervix (biopsy) may need to be removed and then examined. This is often performed if there are areas that appear infected.  A sample of cells from the cervical canal may be removed with either a small brush or scraping instrument (curette). Based on the results of the procedures above, some caregivers may recommend either cryotherapy of the cervix or a surgical LEEP where a portion of the cervix is removed. LEEP is short for "loop electrical excisional  procedure." Rarely, a caregiver may recommend a cone biopsy.This is a procedure where a small, cone-shaped sample of your cervix is taken out. The part that is taken out is the area where the abnormal cells are.  WHAT IF I HAVE A DYSPLASIA OR A CANCER? You may be referred to a specialist. Radiation may also be a treatment for more advanced cancer. Having a hysterectomy is the last treatment option for dysplasia, but it is a more common treatment for someone with cancer. All treatment options will be discussed with you by your caregiver. WHAT SHOULD YOU DO AFTER BEING TREATED? If you have had an abnormal pap test, you should continue to have regular pap tests and check-ups as directed by your caregiver. Your cervical problem will be carefully watched so it does not get worse. Also, your caregiver can watch for, and treat, any new problems that may come up. Document Released: 11/05/2010 Document Revised: 11/15/2012 Document Reviewed: 07/17/2011 [employer] Patient Information 2014 [employer].  

## 2013-10-27 NOTE — Progress Notes (Signed)
Patient ID: Gloria Hall, female   DOB: 24-Jun-1971, 43 y.o.   MRN: [id number]  Chief Complaint  Patient presents with  . Gynecologic Exam    breast, pap, no mammogram last on 05/2013    HPI Gloria Hall is a 43 y.o. female.  [id number] Patient's last menstrual period was 09/30/2013. Last pap was nl with positive high risk hpv. Mammogram was done 10/14  HPI  Past Medical History  Diagnosis Date  . Anemia   . Anxiety   . LSIL (low grade squamous intraepithelial lesion) on Pap smear 08/21/2011  . Migraines   . Arthritis     Past Surgical History  Procedure Laterality Date  . Tubal ligation  1993  . Dilation and curretage      after a miscarriage  . Endometrial ablation    . Laparoscopy  03/19/2011    Procedure: LAPAROSCOPY OPERATIVE;  Surgeon: Hollie Salk C. Marice Potter, MD;  Location: [facility];  Service: Gynecology;  Laterality: N/A;  . Novasure ablation  03/19/2011    Procedure: NOVASURE ABLATION;  Surgeon: Hollie Salk C. Marice Potter, MD;  Location: [facility];  Service: Gynecology;  Laterality: N/A;    Family History  Problem Relation Age of Onset  . Diabetes Mother   . Hypertension Mother   . Hyperlipidemia Mother   . Fibromyalgia Mother   . Sleep apnea Mother   . Diabetes Maternal Aunt   . Cancer Maternal Aunt     breast, metastatic cancer throughout body  . Breast cancer Maternal Aunt   . Crohn's disease Maternal Aunt   . Cancer Maternal Uncle     leukemia  . Diabetes Maternal Grandmother   . Cancer Maternal Uncle     colon  . Heart attack Father     Social History History  Substance Use Topics  . Smoking status: Former Smoker -- 2.00 packs/day for 10 years    Types: Cigarettes    Quit date: 05/03/2009  . Smokeless tobacco: Never Used  . Alcohol Use: No     Comment: quit in 2008    Allergies  Allergen Reactions  . Imitrex [Sumatriptan] Shortness Of Breath    Current Outpatient Prescriptions  Medication Sig Dispense Refill  . ALPRAZolam (XANAX) 0.5 MG tablet Take 0.5 mg by mouth 2  (two) times daily as needed for anxiety.      Marland Kitchen amitriptyline (ELAVIL) 50 MG tablet Take 50 mg by mouth at bedtime.      Marland Kitchen atenolol (TENORMIN) 25 MG tablet Take 25 mg by mouth daily.      . butalbital-acetaminophen-caffeine (FIORICET WITH CODEINE) 50-325-40-30 MG per capsule Take 1 capsule by mouth every 4 (four) hours as needed for headache.      . cholecalciferol (VITAMIN D) 1000 UNITS tablet Take 1,000 Units by mouth daily.      Marland Kitchen dicyclomine (BENTYL) 10 MG capsule Take 10 mg by mouth 2 (two) times daily.      Marland Kitchen HYDROcodone-acetaminophen (NORCO/VICODIN) 5-325 MG per tablet Take 1 tablet by mouth every 6 (six) hours as needed for pain.      . naproxen (NAPROSYN) 500 MG tablet Take 500 mg by mouth 2 (two) times daily with a meal.      . omeprazole (PRILOSEC) 20 MG capsule Take 20 mg by mouth daily.      . ondansetron (ZOFRAN-ODT) 4 MG disintegrating tablet Take 4 mg by mouth every 8 (eight) hours as needed for nausea or vomiting.      . phentermine 37.5 MG capsule Take 37.5  mg by mouth every morning.       No current facility-administered medications for this visit.    Review of Systems Review of Systems  Constitutional: Negative.   Respiratory: Negative.   Gastrointestinal: Negative.   Genitourinary: Negative for vaginal discharge, menstrual problem and pelvic pain.    Blood pressure 117/84, pulse 85, temperature 98.4 F (36.9 C), temperature source Oral, height 5\' 3"  (1.6 m), weight 236 lb 9.6 oz (107.321 kg), last menstrual period 09/30/2013.  Physical Exam Physical Exam  Constitutional: She is oriented to person, place, and time. She appears well-developed. No distress.  obese  Neck: Normal range of motion.  Pulmonary/Chest: Effort normal. No respiratory distress.  Breasts no mass or tenderness  Abdominal: Soft. She exhibits no mass. There is no tenderness.  Genitourinary: Vagina normal and uterus normal. No vaginal discharge found.  No mass, pap done  Neurological: She is  alert and oriented to person, place, and time.  Skin: Skin is warm and dry.  Psychiatric: She has a normal mood and affect. Her behavior is normal.    Data Reviewed Pap result  Assessment    Well woman exam. Pos HPV pap     Plan    If pap normal then repeat cotesting 12 mo, yearly mammogram        Cordney Barstow 10/27/2013, 3:03 PM

## 2013-10-31 ENCOUNTER — Other Ambulatory Visit: Payer: Self-pay | Admitting: Orthopaedic Surgery

## 2013-10-31 DIAGNOSIS — M25562 Pain in left knee: Secondary | ICD-10-CM

## 2013-11-07 ENCOUNTER — Ambulatory Visit
Admission: RE | Admit: 2013-11-07 | Discharge: 2013-11-07 | Disposition: A | Discharge status: Home / Self Care | Payer: BC Managed Care – PPO | Source: Ambulatory Visit | Attending: Orthopaedic Surgery | Admitting: Orthopaedic Surgery

## 2013-11-07 DIAGNOSIS — M25562 Pain in left knee: Secondary | ICD-10-CM

## 2013-11-21 ENCOUNTER — Telehealth: Payer: Self-pay | Admitting: *Deleted

## 2013-11-21 NOTE — Telephone Encounter (Addendum)
4/20  1004  Called pt regarding her Pap result and need for colposcopy on 5/1 @ 0830.  Pt has had colpo in the past and had no questions regarding the procedure. She states she will be having knee surgery on 4/30 and needs colpo appt rescheduled to a later date. I advised her that I will call back with new appt information once it has been changed. Pt voiced understanding.  4/22  1120  Called pt and left message on her personal voice mail regarding her appt change to 6/24 @ 1315.  She may call back if she has questions.

## 2013-11-25 ENCOUNTER — Ambulatory Visit (INDEPENDENT_AMBULATORY_CARE_PROVIDER_SITE_OTHER): Payer: BC Managed Care – PPO | Admitting: Nurse Practitioner

## 2013-11-25 ENCOUNTER — Encounter: Payer: Self-pay | Admitting: Nurse Practitioner

## 2013-11-25 VITALS — BP 123/80 | HR 87 | Ht 63.0 in | Wt 232.0 lb

## 2013-11-25 DIAGNOSIS — M545 Low back pain, unspecified: Secondary | ICD-10-CM

## 2013-11-25 DIAGNOSIS — R29898 Other symptoms and signs involving the musculoskeletal system: Secondary | ICD-10-CM

## 2013-11-25 DIAGNOSIS — R200 Anesthesia of skin: Secondary | ICD-10-CM

## 2013-11-25 DIAGNOSIS — M6281 Muscle weakness (generalized): Secondary | ICD-10-CM

## 2013-11-25 DIAGNOSIS — R52 Pain, unspecified: Secondary | ICD-10-CM

## 2013-11-25 DIAGNOSIS — R209 Unspecified disturbances of skin sensation: Secondary | ICD-10-CM

## 2013-11-25 MED ORDER — DULOXETINE HCL 30 MG PO CPEP: 30.0000 mg | ORAL_CAPSULE | Freq: Two times a day (BID) | ORAL | Status: DC

## 2013-11-25 MED ORDER — DULOXETINE HCL 30 MG PO CPEP: 30.0000 mg | ORAL_CAPSULE | Freq: Every day | ORAL | Status: DC

## 2013-11-25 NOTE — Patient Instructions (Addendum)
- Recommend daily B12 oral supplement, recheck level in 3 months. - Trial of cymbalta 30 mg, if tolerated, increase to twice daily after 1 month. - NCV/EMG study, someone will call you to schedule. - continue physical therapy exercises (patient does these at work and at home)  - Follow up in 6 weeks.  Fibromyalgia Fibromyalgia is a disorder that is often misunderstood. It is associated with muscular pains and tenderness that comes and goes. It is often associated with fatigue and sleep disturbances. Though it tends to be long-lasting, fibromyalgia is not life-threatening. CAUSES  The exact cause of fibromyalgia is unknown. People with certain gene types are predisposed to developing fibromyalgia and other conditions. Certain factors can play a role as triggers, such as:  Spine disorders.  Arthritis.  Severe injury (trauma) and other physical stressors.  Emotional stressors. SYMPTOMS   The main symptom is pain and stiffness in the muscles and joints, which can vary over time.  Sleep and fatigue problems. Other related symptoms may include:  Bowel and bladder problems.  Headaches.  Visual problems.  Problems with odors and noises.  Depression or mood changes.  Painful periods (dysmenorrhea).  Dryness of the skin or eyes. DIAGNOSIS  There are no specific tests for diagnosing fibromyalgia. Patients can be diagnosed accurately from the specific symptoms they have. The diagnosis is made by determining that nothing else is causing the problems. TREATMENT  There is no cure. Management includes medicines and an active, healthy lifestyle. The goal is to enhance physical fitness, decrease pain, and improve sleep. HOME CARE INSTRUCTIONS   Only take over-the-counter or prescription medicines as directed by your caregiver. Sleeping pills, tranquilizers, and pain medicines may make your problems worse.  Low-impact aerobic exercise is very important and advised for treatment. At first,  it may seem to make pain worse. Gradually increasing your tolerance will overcome this feeling.  Learning relaxation techniques and how to control stress will help you. Biofeedback, visual imagery, hypnosis, muscle relaxation, yoga, and meditation are all options.  Anti-inflammatory medicines and physical therapy may provide short-term help.  Acupuncture or massage treatments may help.  Take muscle relaxant medicines as suggested by your caregiver.  Avoid stressful situations.  Plan a healthy lifestyle. This includes your diet, sleep, rest, exercise, and friends.  Find and practice a hobby you enjoy.  Join a fibromyalgia support group for interaction, ideas, and sharing advice. This may be helpful. SEEK MEDICAL CARE IF:  You are not having good results or improvement from your treatment. FOR MORE INFORMATION  National Fibromyalgia Association: www.fmaware.[place]: www.arthritis.org Document Released: 07/21/2005 Document Revised: 10/13/2011 Document Reviewed: 10/31/2009 [facility] Patient Information 2014 [place], [place].  Electromyography (EMG) Test This is a test in which very small electrodes are placed into your muscle tissue. It looks at the electrical impulses of your muscle tissue. This test is used to determine whether or not there are involuntary or spontaneous muscle movements. Involuntary or spontaneous means muscle movements that happen by themselves. This may indicate injury or disease of the nerves which supply that muscle. PREPARATION FOR TEST No preparation or fasting is necessary. Some stimulants such as caffeine and tobacco may need to be avoided for 2-3 hours before test or as instructed by your caregiver. NORMAL FINDINGS No evidence of neuromuscular abnormalities. Ranges for normal findings may vary among different laboratories and hospitals. You should always check with your doctor after having lab work or other tests done to discuss the meaning of  your test results and  whether your values are considered within normal limits. MEANING OF TEST  Your caregiver will go over the test results with you and discuss the importance and meaning of your results, as well as treatment options and the need for additional tests if necessary. OBTAINING THE TEST RESULTS It is your responsibility to obtain your test results. Ask the lab or department performing the test when and how you will get your results. Document Released: 11/21/2004 Document Revised: 10/13/2011 Document Reviewed: 06/30/2008 [facility] Patient Information 2014 [place], [place].

## 2013-11-25 NOTE — Progress Notes (Signed)
PATIENT: Gloria Hall DOB: [birth year]  REASON FOR VISIT: follow up HISTORY FROM: patient  HISTORY OF PRESENT ILLNESS: 43 year old right-handed female here for evaluation of low back pain, left hip pain, left leg pain and bilateral foot numbness.  05/27/13 (VP):  Symptoms have been going on for past one to 2 years. She has pain in her left sacroiliac joint region, left hip, radiating to her left leg and foot. She's been evaluated by orthopedic surgery, had MRI of the lumbar spine which been unremarkable. Patient is currently on hydrocodone, meloxicam, ibuprofen, amitriptyline per her PCP for pain control. She also has diffuse pain all over her body, possible fibromyalgia, and frequent migraine headaches. Patient denies any prior injuries. In 1993 when she was pregnant, she was told that her BP was laying on her left sided may have caused some nerve damage in her left leg. No pain in her right leg. No significant pain in her arms or neck.  Patient has had prior diagnosis of carpal tunnel syndrome and left hand, previously on gabapentin.  Patient is comfortable with her current medication regimen. She has tried some physical therapy exercises previously when she had a lumbar strain injury. She did not get much relief previously.   UPDATE 11/25/13 (LL): Gloria Hall returns for follow up after consult with Dr. Merceda Elks 6 months ago.  Her neuropathy panel showed low vitamin D and borderline B12.  She has started to take a Vitamin D supplement. She continues to have diffuse pain, with numbness in feet and weakness in hands.  She takes Amitriptyline 50 mg each night to help her sleep and reduce headaches, but still has frequent headaches.  She feels her carpel tunnel is getting worse because she is dropping things out of her hands.  REVIEW OF SYSTEMS: Full 14 system review of systems performed and notable only for anxiety agitation, headache numbness weakness dizziness insomnia restless legs, increased  thirst joint pain, joint swelling, aching muscles and sensitivity shortness of breath, fatigue, appetite change, chills,  swelling in legs ringing in ear, irritable bowel  ALLERGIES: Allergies  Allergen Reactions  . Imitrex [Sumatriptan] Shortness Of Breath    HOME MEDICATIONS: Outpatient Prescriptions Prior to Visit  Medication Sig Dispense Refill  . ALPRAZolam (XANAX) 0.5 MG tablet Take 0.5 mg by mouth 2 (two) times daily as needed for anxiety.      Marland Kitchen amitriptyline (ELAVIL) 50 MG tablet Take 50 mg by mouth at bedtime.      Marland Kitchen atenolol (TENORMIN) 25 MG tablet Take 25 mg by mouth daily.      . butalbital-acetaminophen-caffeine (FIORICET WITH CODEINE) 50-325-40-30 MG per capsule Take 1 capsule by mouth every 4 (four) hours as needed for headache.      . cholecalciferol (VITAMIN D) 1000 UNITS tablet Take 1,000 Units by mouth daily.      Marland Kitchen dicyclomine (BENTYL) 10 MG capsule Take 10 mg by mouth 2 (two) times daily.      Marland Kitchen HYDROcodone-acetaminophen (NORCO/VICODIN) 5-325 MG per tablet Take 1 tablet by mouth every 6 (six) hours as needed for pain.      . naproxen (NAPROSYN) 500 MG tablet Take 500 mg by mouth 2 (two) times daily with a meal.      . omeprazole (PRILOSEC) 20 MG capsule Take 20 mg by mouth daily.      . ondansetron (ZOFRAN-ODT) 4 MG disintegrating tablet Take 4 mg by mouth every 8 (eight) hours as needed for nausea or vomiting.      Marland Kitchen  phentermine 37.5 MG capsule Take 37.5 mg by mouth every morning.       No facility-administered medications prior to visit.   PHYSICAL EXAM  Filed Vitals:   11/25/13 0903  BP: 123/80  Pulse: 87  Height: 5\' 3"  (1.6 m)  Weight: 232 lb (105.235 kg)   Body mass index is 41.11 kg/(m^2).  GENERAL EXAM:  Patient is in no distress; STRAIGHT LEG RAISE POSITIVE IN LEFT > RIGHT LEGS. FABER TESTING POSITIVE BILATERALLY.  CARDIOVASCULAR:  Regular rate and rhythm, no murmurs, no carotid bruits   NEUROLOGIC:  MENTAL STATUS: awake, alert, language fluent,  comprehension intact, naming intact  CRANIAL NERVE: pupils equal and reactive to light, visual fields full to confrontation, extraocular muscles intact, no nystagmus, facial sensation and strength symmetric, uvula midline, shoulder shrug symmetric, tongue midline.  MOTOR: normal bulk and tone, full strength in the BUE, BLE  SENSORY: normal and symmetric to light touch, pinprick, temperature, vibration  COORDINATION: finger-nose-finger, fine finger movements normal  REFLEXES: deep tendon reflexes present and symmetric; TRACE IN ANKLES.  GAIT/STATION: narrow based gait; able to walk on toes and tandem; DIFF WITH HEEL WALKING. Romberg is negative  DIAGNOSTIC DATA (LABS, IMAGING, TESTING) - I reviewed patient records, labs, notes, testing and imaging myself where available.  No results found for this basename: HGBA1C   Lab Results  Component Value Date   VITAMINB12 267 05/27/2013   Lab Results  Component Value Date   TSH 2.080 05/27/2013   Lab Results  Component Value Date   ESRSEDRATE 28 05/27/2013   04/05/13 MRI lumbar spine - no spinal stenosis or foraminal narrowing   ASSESSMENT AND PLAN 43 y.o. year old female here with low back pain, left hip pain, left leg pain, bilateral foot numbness. Neurologic exam notable only for decreased sensation in the feet to pinprick, with trace ankle jerk reflexes. Patient may have underlying peripheral neuropathy. Patient has decreased range of motion and straight leg raise testing with some reproduction of pain. She may have some muscle skeletal cause of pain as well. No evidence of lumbar radiculopathy on MRI of the lumbar spine. Other aggravating factors include underlying depression, anxiety, possible fibromyalgia.   Borderline low B12.  Ddx: neuropathy, osteoarthritis, fibromyalgia, pain syndrome   PLAN:  - Recommend daily B12 oral supplement, recheck level in 3 months. - Trial of cymbalta 30 mg, if tolerated, increase to BID after 1 month for  diffuse pain and depression - NCV/EMG for upper and lower, for bilateral hand weakness, numbness in feet. - continue physical therapy exercises (patient does these at work and at home)  - Follow up in 6 weeks.   Orders Placed This Encounter  Procedures  . NCV with EMG(electromyography)   Meds ordered this encounter  Medications  . DULoxetine (CYMBALTA) 30 MG capsule    Sig: Take 1 capsule (30 mg total) by mouth daily.    Dispense:  42 capsule    Refill:  0    SAMPLES LOT [id number] A EXP 06/2014    Order Specific Question:  Supervising Provider    Answer:  Joycelyn Schmid R [[id number]]  . DULoxetine (CYMBALTA) 30 MG capsule    Sig: Take 1 capsule (30 mg total) by mouth 2 (two) times daily.    Dispense:  60 capsule    Refill:  3    Order Specific Question:  Supervising Provider    Answer:  Joycelyn Schmid R [[id number]]   Return in about 6 weeks (around 01/06/2014).  Gloria Hynek E. Mattix Imhof,  MSN, NP-C 11/25/2013, 9:48 AM [facility] [address] [place], [place] [address] [phone]  Note: This document was prepared with digital dictation and possible smart phrase technology. Any transcriptional errors that result from this process are unintentional.

## 2013-11-29 ENCOUNTER — Encounter (INDEPENDENT_AMBULATORY_CARE_PROVIDER_SITE_OTHER): Payer: BC Managed Care – PPO | Admitting: Radiology

## 2013-11-29 ENCOUNTER — Ambulatory Visit (INDEPENDENT_AMBULATORY_CARE_PROVIDER_SITE_OTHER): Payer: BC Managed Care – PPO | Admitting: Diagnostic Neuroimaging

## 2013-11-29 DIAGNOSIS — M6281 Muscle weakness (generalized): Secondary | ICD-10-CM

## 2013-11-29 DIAGNOSIS — R200 Anesthesia of skin: Secondary | ICD-10-CM

## 2013-11-29 DIAGNOSIS — M545 Low back pain, unspecified: Secondary | ICD-10-CM

## 2013-11-29 DIAGNOSIS — Z0289 Encounter for other administrative examinations: Secondary | ICD-10-CM

## 2013-11-29 DIAGNOSIS — R29898 Other symptoms and signs involving the musculoskeletal system: Secondary | ICD-10-CM

## 2013-11-29 DIAGNOSIS — R209 Unspecified disturbances of skin sensation: Secondary | ICD-10-CM

## 2013-11-29 DIAGNOSIS — R52 Pain, unspecified: Secondary | ICD-10-CM

## 2013-11-30 NOTE — Procedures (Signed)
[facility]  NCS (NERVE CONDUCTION STUDY) WITH EMG (ELECTROMYOGRAPHY) REPORT   STUDY DATE: 11/29/13 PATIENT NAME: Gloria Hall DOB: Dec 30, 1970 MRN: [id number]  ORDERING CLINICIAN: Heide Guile, NP / Joycelyn Schmid, MD   TECHNOLOGIST: Kaylyn Lim  ELECTROMYOGRAPHER: Glenford Bayley. Marcelle Bebout, MD  CLINICAL INFORMATION: 43 year old female with numbness and pain.  FINDINGS: NERVE CONDUCTION STUDY: Right median motor response has prolonged distal latency, normal amplitude, normal conduction velocity and normal F-wave latency. Left median, bilateral ulnar, bilateral peroneal and bilateral tibial motor responses have normal distal latencies, amplitudes, conduction velocities and F-wave latencies. Left median, bilateral ulnar, bilateral sural sensory responses are normal. Right median sensory response has slow conduction velocity and normal amplitude. Bilateral median mixed nerve transcarpal responses have slow conduction velocities and normal amplitudes. Bilateral ulnar transcarpal mixed nerve responses are normal.  NEEDLE ELECTROMYOGRAPHY: Examination right upper extremity (deltoid, biceps, triceps, flexor carpi radialis, first dorsal interosseous) is normal.  IMPRESSION:  Abnormal study demonstrating: 1. Bilateral median neuropathies at the wrists consistent with bilateral carpal tunnel syndrome. 2. No evidence of widespread underlying large fiber polyneuropathy.   INTERPRETING PHYSICIAN:  Suanne Marker, MD Certified in Neurology, Neurophysiology and Neuroimaging  [facility] [address] [place], [place] [address] [phone]

## 2013-12-01 HISTORY — PX: KNEE ARTHROSCOPY: SUR90

## 2013-12-02 ENCOUNTER — Encounter: Payer: BC Managed Care – PPO | Admitting: Obstetrics & Gynecology

## 2014-01-09 ENCOUNTER — Ambulatory Visit: Payer: BC Managed Care – PPO | Admitting: Nurse Practitioner

## 2014-01-09 ENCOUNTER — Telehealth: Payer: Self-pay | Admitting: Nurse Practitioner

## 2014-01-09 NOTE — Telephone Encounter (Signed)
Patient was no show for today's office appointment.  

## 2014-01-13 NOTE — Progress Notes (Signed)
I reviewed note and agree with plan.   Cobi Aldape R. Sabrine Patchen, MD  Certified in Neurology, Neurophysiology and Neuroimaging  [facility] [address], [address] [place], [place] [address] [phone]   

## 2014-01-25 ENCOUNTER — Encounter: Payer: BC Managed Care – PPO | Admitting: Obstetrics & Gynecology

## 2014-01-25 ENCOUNTER — Telehealth: Payer: Self-pay | Admitting: *Deleted

## 2014-01-25 ENCOUNTER — Encounter: Payer: Self-pay | Admitting: *Deleted

## 2014-01-25 NOTE — Telephone Encounter (Signed)
Eliany missed a scheduled appointment for colposcopy. Called [facility] and left a message we are calling to let her know she missed an appointment with [place]- please call office to reschedule and you may ask questions if needed.  Not sure if machine recorded all of my message as I heard a beep before I finished.

## 2014-01-25 NOTE — Telephone Encounter (Signed)
Letter sent to patient.

## 2014-02-04 ENCOUNTER — Emergency Department (HOSPITAL_COMMUNITY)
Admission: EM | Admit: 2014-02-04 | Discharge: 2014-02-04 | Disposition: A | Discharge status: Home / Self Care | Payer: BC Managed Care – PPO | Attending: Emergency Medicine | Admitting: Emergency Medicine

## 2014-02-04 ENCOUNTER — Emergency Department (HOSPITAL_COMMUNITY): Payer: BC Managed Care – PPO

## 2014-02-04 ENCOUNTER — Encounter (HOSPITAL_COMMUNITY): Payer: Self-pay | Admitting: Emergency Medicine

## 2014-02-04 DIAGNOSIS — G43909 Migraine, unspecified, not intractable, without status migrainosus: Secondary | ICD-10-CM | POA: Insufficient documentation

## 2014-02-04 DIAGNOSIS — M129 Arthropathy, unspecified: Secondary | ICD-10-CM | POA: Insufficient documentation

## 2014-02-04 DIAGNOSIS — Z791 Long term (current) use of non-steroidal anti-inflammatories (NSAID): Secondary | ICD-10-CM | POA: Insufficient documentation

## 2014-02-04 DIAGNOSIS — F411 Generalized anxiety disorder: Secondary | ICD-10-CM | POA: Insufficient documentation

## 2014-02-04 DIAGNOSIS — Z79899 Other long term (current) drug therapy: Secondary | ICD-10-CM | POA: Insufficient documentation

## 2014-02-04 DIAGNOSIS — Z87891 Personal history of nicotine dependence: Secondary | ICD-10-CM | POA: Insufficient documentation

## 2014-02-04 DIAGNOSIS — Z862 Personal history of diseases of the blood and blood-forming organs and certain disorders involving the immune mechanism: Secondary | ICD-10-CM | POA: Insufficient documentation

## 2014-02-04 DIAGNOSIS — R42 Dizziness and giddiness: Secondary | ICD-10-CM | POA: Insufficient documentation

## 2014-02-04 HISTORY — DX: Bronchitis, not specified as acute or chronic: J40

## 2014-02-04 HISTORY — DX: Neuralgia and neuritis, unspecified: M79.2

## 2014-02-04 HISTORY — DX: Irritable bowel syndrome, unspecified: K58.9

## 2014-02-04 LAB — URINALYSIS, ROUTINE W REFLEX MICROSCOPIC
Bilirubin Urine: NEGATIVE
GLUCOSE, UA: NEGATIVE mg/dL
HGB URINE DIPSTICK: NEGATIVE
Ketones, ur: NEGATIVE mg/dL
LEUKOCYTES UA: NEGATIVE
Nitrite: NEGATIVE
PROTEIN: NEGATIVE mg/dL
SPECIFIC GRAVITY, URINE: 1.02 (ref 1.005–1.030)
UROBILINOGEN UA: 1 mg/dL (ref 0.0–1.0)
pH: 5 (ref 5.0–8.0)

## 2014-02-04 LAB — CBC WITH DIFFERENTIAL/PLATELET
BASOS ABS: 0 10*3/uL (ref 0.0–0.1)
Basophils Relative: 0 % (ref 0–1)
EOS PCT: 2 % (ref 0–5)
Eosinophils Absolute: 0.2 10*3/uL (ref 0.0–0.7)
HCT: 33.5 % — ABNORMAL LOW (ref 36.0–46.0)
Hemoglobin: 10.6 g/dL — ABNORMAL LOW (ref 12.0–15.0)
Lymphocytes Relative: 30 % (ref 12–46)
Lymphs Abs: 2.5 10*3/uL (ref 0.7–4.0)
MCH: 25.9 pg — ABNORMAL LOW (ref 26.0–34.0)
MCHC: 31.6 g/dL (ref 30.0–36.0)
MCV: 81.9 fL (ref 78.0–100.0)
Monocytes Absolute: 0.8 10*3/uL (ref 0.1–1.0)
Monocytes Relative: 9 % (ref 3–12)
NEUTROS ABS: 4.7 10*3/uL (ref 1.7–7.7)
Neutrophils Relative %: 59 % (ref 43–77)
PLATELETS: 301 10*3/uL (ref 150–400)
RBC: 4.09 MIL/uL (ref 3.87–5.11)
RDW: 14.2 % (ref 11.5–15.5)
WBC: 8.2 10*3/uL (ref 4.0–10.5)

## 2014-02-04 LAB — I-STAT CHEM 8, ED
BUN: 10 mg/dL (ref 6–23)
Calcium, Ion: 1.15 mmol/L (ref 1.12–1.23)
Chloride: 105 mEq/L (ref 96–112)
Creatinine, Ser: 0.7 mg/dL (ref 0.50–1.10)
Glucose, Bld: 112 mg/dL — ABNORMAL HIGH (ref 70–99)
HCT: 35 % — ABNORMAL LOW (ref 36.0–46.0)
HEMOGLOBIN: 11.9 g/dL — AB (ref 12.0–15.0)
Potassium: 3.6 mEq/L — ABNORMAL LOW (ref 3.7–5.3)
SODIUM: 137 meq/L (ref 137–147)
TCO2: 23 mmol/L (ref 0–100)

## 2014-02-04 LAB — I-STAT TROPONIN, ED: TROPONIN I, POC: 0 ng/mL (ref 0.00–0.08)

## 2014-02-04 MED ORDER — SODIUM CHLORIDE 0.9 % IV SOLN
INTRAVENOUS | Status: DC
  Administered 2014-02-04: 16:00:00 via INTRAVENOUS

## 2014-02-04 MED ORDER — MECLIZINE HCL 25 MG PO TABS
25.0000 mg | ORAL_TABLET | Freq: Once | ORAL | Status: AC
  Administered 2014-02-04: 25 mg via ORAL
  Filled 2014-02-04: qty 1

## 2014-02-04 MED ORDER — MECLIZINE HCL 50 MG PO TABS: 50.0000 mg | ORAL_TABLET | Freq: Three times a day (TID) | ORAL | Status: DC | PRN

## 2014-02-04 MED ORDER — ONDANSETRON HCL 4 MG/2ML IJ SOLN
4.0000 mg | Freq: Once | INTRAMUSCULAR | Status: AC
  Administered 2014-02-04: 4 mg via INTRAVENOUS
  Filled 2014-02-04: qty 2

## 2014-02-04 MED ORDER — SODIUM CHLORIDE 0.9 % IV BOLUS (SEPSIS)
1000.0000 mL | Freq: Once | INTRAVENOUS | Status: AC
  Administered 2014-02-04: 1000 mL via INTRAVENOUS

## 2014-02-04 NOTE — Discharge Instructions (Signed)
Dizziness °Dizziness is a common problem. It is a feeling of unsteadiness or light-headedness. You may feel like you are about to faint. Dizziness can lead to injury if you stumble or fall. A person of any age group can suffer from dizziness, but dizziness is more common in older adults. °CAUSES  °Dizziness can be caused by many different things, including: °· Middle ear problems. °· Standing for too long. °· Infections. °· An allergic reaction. °· Aging. °· An emotional response to something, such as the sight of blood. °· Side effects of medicines. °· Tiredness. °· Problems with circulation or blood pressure. °· Excessive use of alcohol or medicines, or illegal drug use. °· Breathing too fast (hyperventilation). °· An irregular heart rhythm (arrhythmia). °· A low red blood cell count (anemia). °· Pregnancy. °· Vomiting, diarrhea, fever, or other illnesses that cause body fluid loss (dehydration). °· Diseases or conditions such as Parkinson's disease, high blood pressure (hypertension), diabetes, and thyroid problems. °· Exposure to extreme heat. °DIAGNOSIS  °Your health care provider will ask about your symptoms, perform a physical exam, and perform an electrocardiogram (ECG) to record the electrical activity of your heart. Your health care provider may also perform other heart or blood tests to determine the cause of your dizziness. These may include: °· Transthoracic echocardiogram (TTE). During echocardiography, sound waves are used to evaluate how blood flows through your heart. °· Transesophageal echocardiogram (TEE). °· Cardiac monitoring. This allows your health care provider to monitor your heart rate and rhythm in real time. °· Holter monitor. This is a portable device that records your heartbeat and can help diagnose heart arrhythmias. It allows your health care provider to track your heart activity for several days if needed. °· Stress tests by exercise or by giving medicine that makes the heart beat  faster. °TREATMENT  °Treatment of dizziness depends on the cause of your symptoms and can vary greatly. °HOME CARE INSTRUCTIONS  °· Drink enough fluids to keep your urine clear or pale yellow. This is especially important in very hot weather. In older adults, it is also important in cold weather. °· Take your medicine exactly as directed if your dizziness is caused by medicines. When taking blood pressure medicines, it is especially important to get up slowly. °¨ Rise slowly from chairs and steady yourself until you feel okay. °¨ In the morning, first sit up on the side of the bed. When you feel okay, stand slowly while holding onto something until you know your balance is fine. °· Move your legs often if you need to stand in one place for a long time. Tighten and relax your muscles in your legs while standing. °· Have someone stay with you for 1-2 days if dizziness continues to be a problem. Do this until you feel you are well enough to stay alone. Have the person call your health care provider if he or she notices changes in you that are concerning. °· Do not drive or use heavy machinery if you feel dizzy. °· Do not drink alcohol. °SEEK IMMEDIATE MEDICAL CARE IF:  °· Your dizziness or light-headedness gets worse. °· You feel nauseous or vomit. °· You have problems talking, walking, or using your arms, hands, or legs. °· You feel weak. °· You are not thinking clearly or you have trouble forming sentences. It may take a friend or family member to notice this. °· You have chest pain, abdominal pain, shortness of breath, or sweating. °· Your vision changes. °· You notice   any bleeding. °· You have side effects from medicine that seems to be getting worse rather than better. °MAKE SURE YOU:  °· Understand these instructions. °· Will watch your condition. °· Will get help right away if you are not doing well or get worse. °Document Released: 01/14/2001 Document Revised: 07/26/2013 Document Reviewed: 02/07/2011 °ExitCare®  Patient Information ©2015 [employer]. This information is not intended to replace advice given to you by your health care provider. Make sure you discuss any questions you have with your health care provider. ° ° °Emergency Department Resource Guide °1) Find a Doctor and Pay Out of Pocket °Although you won't have to find out who is covered by your insurance plan, it is a good idea to ask around and get recommendations. You will then need to call the office and see if the doctor you have chosen will accept you as a new patient and what types of options they offer for patients who are self-pay. Some doctors offer discounts or will set up payment plans for their patients who do not have insurance, but you will need to ask so you aren't surprised when you get to your appointment. ° °2) Contact Your Local Health Department °Not all health departments have doctors that can see patients for sick visits, but many do, so it is worth a call to see if yours does. If you don't know where your local health department is, you can check in your phone book. The CDC also has a tool to help you locate your state's health department, and many state websites also have listings of all of their local health departments. ° °3) Find a Walk-in Clinic °If your illness is not likely to be very severe or complicated, you may want to try a walk in clinic. These are popping up all over the country in pharmacies, drugstores, and shopping centers. They're usually staffed by nurse practitioners or physician assistants that have been trained to treat common illnesses and complaints. They're usually fairly quick and inexpensive. However, if you have serious medical issues or chronic medical problems, these are probably not your best option. ° °No Primary Care Doctor: °- Call [facility] at  832-8000 - they can help you locate a primary care doctor that  accepts your insurance, provides certain services, etc. °- Physician Referral Service-  1-800-533-3463 ° °Chronic Pain Problems: °Organization         Address  Phone   Notes  °[facility]  (336) 297-2271 Patients need to be referred by their primary care doctor.  ° °Medication Assistance: °Organization         Address  Phone   Notes  °[place] [facility] [address] °[place], [place] [address] (336) 641-8030 --Must be a resident of [place] °-- Must have NO insurance coverage whatsoever (no Medicaid/ Medicare, etc.) °-- The pt. MUST have a primary care doctor that directs their care regularly and follows them in the community °  °[facility]  (866) 331-1348   °[employer]  (888) 892-1162   ° °Agencies that provide inexpensive medical care: °Organization         Address  Phone   Notes  °[facility]  (336) 832-8035   °[facility]    (336) 832-7272   °[facility] [address] °[place], [place] [address] (336) 832-4777   °[facility] [address], °[place] (336) 271-4999   °[facility]    (  336) 373-0678   °[facility]    (336) 272-1050   °[facility] ° [address], [place] Phone:  (336) 832-4444, Fax:  (336) 832-4440 Hours of Operation:  9 am - 6 pm, M-F.  Also accepts Medicaid/Medicare and self-pay.  °[facility] ° [address], [place] Phone: (336) 832-3150, Fax: (336) 832-3151. Hours of Operation:  8:30 am - 5:30 pm, M-F.  Also accepts Medicaid and self-pay.  °[facility] [place] [address], [place] Phone: (336) 878-6027   °[facility] [address], [place], [place] (336)723-1848, Ext. 123 Mondays & Thursdays: 7-9 AM.  First 15 patients are seen on a first come, first serve basis. °  ° °Medicaid-accepting [place] Providers: ° °Organization         Address  Phone   Notes  °[facility] [address],  [place] (336) 641-2100 Also accepts self-pay patients.  °[facility] [address], [place] ° (336) 856-9996   °[facility] [address], [place] (336) 288-8857   °[facility] [address] [place] [address], [place] (336) 299-7000   °Veita Bland [address], [place]  ° (336) 373-1557 Only accepts [place] Access Medicaid patients after they have their name applied to their card.  ° °Self-Pay (no insurance) in [place]: ° °Organization         Address  Phone   Notes  °Sickle Cell Patients, [facility] [address], [place] (336) 832-1970   °[facility] [address], [place] (336) 832-4400   °[facility] ° [address], [place] (336) 992-4800   °[facility]/Dr. Osei-Bonsu ° [address] [place] [address], [place] or [address], [place] (336) 841-8500 Phone number for both [place] and [place] locations is the same.  °[facility] [address], [place] (336) 299-0000   °[facility] [address] [place] [address], [place] or [address] (336) 852-7530 °(336) 878-2260   °[facility] [address], [place] (336) 350-1642, phone; (336) 294-5005, fax Sees patients 1st and 3rd Saturday of every month.  Must not qualify for public or private insurance (i.e. Medicaid, Medicare, [place] Health Choice, Veterans' Benefits) • Household income should be no more than 200% of the poverty level •The clinic cannot treat you if you are pregnant or think you are pregnant • Sexually transmitted diseases are not treated at the clinic.  ° ° °Dental Care: °Organization         Address  Phone  Notes  °[facility] [address],  (336) 641-6152 Accepts children up to age 21 who are enrolled in  Medicaid or [place] Health Choice; pregnant women with a Medicaid card; and children who have applied for Medicaid or [place] Health Choice, but were declined, whose parents can pay a reduced fee at time of service.  °[facility] [place]  [address], [place] (336) 641-7733 Accepts children up to age 21 who are enrolled in Medicaid or [place] Health Choice; pregnant women with a Medicaid card; and children who have applied for Medicaid or [place] Health Choice, but were declined, whose parents can pay a   reduced fee at time of service.  °[facility] ° [address], [place] (336) 641-4533 Patients are seen by appointment only. Walk-ins are not accepted. [facility] will see patients 18 years of age and older. °Monday - Tuesday (8am-5pm) °Most Wednesdays (8:30-5pm) °$30 per visit, cash only  °[facility] ° [address], [place] (336) 641-4533 Patients are seen by appointment only. Walk-ins are not accepted. [facility] will see patients 18 years of age and older. °One Wednesday Evening (Monthly: Volunteer Based).  $30 per visit, cash only  °[facility]  (919) 537-3737 for adults; Children under age 4, call Graduate Pediatric Dentistry at ([phone]) [phone]. Children aged 4-14, please call ([phone]) [phone] to request a pediatric application. ° Dental services are provided in all areas of dental care including fillings, crowns and bridges, complete and partial dentures, implants, gum treatment, root canals, and extractions. Preventive care is also provided. Treatment is provided to both adults and children. °Patients are selected via a lottery and there is often a waiting list. °  °[facility] [address], °[place] ° (336) 763-8833 [email or web address] °  °[facility] [address], [place], [place] (336)723-1848, Ext. 123 Second and Fourth Thursday of each month, opens at 6:30  AM; Clinic ends at 9 AM.  Patients are seen on a first-come first-served basis, and a limited number are seen during each clinic.  ° °[facility] ° [address], [place], [place] (336) 723-7904   Eligibility Requirements °You must have lived in [place], [place], or [place] counties for at least the last three months. °  You cannot be eligible for state or federal sponsored healthcare insurance, including Veterans Administration, Medicaid, or Medicare. °  You generally cannot be eligible for healthcare insurance through your employer.  °  How to apply: °Eligibility screenings are held every Tuesday and Wednesday afternoon from 1:00 pm until 4:00 pm. You do not need an appointment for the interview!  °[facility] [address], [place], [place] 336-631-2330   °[facility]  336-342-8273   °[facility]  336-703-3100   ° [facility]  336-570-6415   ° °Behavioral Health Resources in the Community: °Intensive Outpatient Programs °Organization         Address  Phone  Notes  °[facility] [address], [place], [place] 336-878-6098   °[facility] [address], [place], [place] 336-832-9800   °[facility] [address], [place], [place] ° 336-882-2125   °[facility] [address],  °[place], [place] 1-800-853-5163 or 336-641-4981   °Substance Abuse Resources °Organization         Address  Phone  Notes  °[facility]  336-882-2125   °[facility]  336-784-9470   °[facility]  336-285-9073   °[facility]  336-845-3988   °Residential & Outpatient Substance Abuse Program  1-800-659-3381   °Psychological Services °Organization         Address  Phone  Notes  °[facility]  336- 832-9600   °[facility]  336- 378-7881   °[facility] [address], [place] 1-800-853-5163 or  336-641-4981   ° °Mobile Crisis Teams °Organization         Address  Phone  Notes  °[facility], Mobile Crisis Care Unit  1-877-626-1772   °  Assertive °[facility] ° [address] [place], [place] 336-834-9664   °Sharon DeEsch [address] °[place] 336-554-5454   ° °Self-Help/Support Groups °Organization         Address  Phone             Notes  °[facility] [place] [facility] - variety of support groups  336- 373-1402 Call for more information  °Narcotics Anonymous (NA), [facility] [address], °[place]  2 meetings at this location  ° °Residential Treatment Programs °Organization         Address  Phone  Notes  °[facility] [address],    °[place]  1-866-801-8205   °[facility] ° [address], [place], [place] 704-293-8524   °[facility] [address], [place] 336-845-3988 Admissions: 8am-3pm M-F  °[facility] [address]    °[place], [place] 336-841-1104   °[facility] [address], [place], [place] 336-379-7146   °[facility] [address]  °[place], [place] 336-285-9073   °[facility] - Intensive Outpatient [address], [place], [place] 336-852-3033   °[facility] [address]  °[place], [place] 1-877-615-2722 or 336-784-9470   °[facility] [address] [place], [place] 336-227-7417 Accepts Medicaid  °[facility] [address]  °[place] 1-800-659-3381 Substance Abuse/Addiction Treatment  ° °[place] Behavioral Health Resources °Organization         Address  Phone  Notes  °[facility]  (888) 581-9988   °Julie Brannon, PhD [address] [place], [place]   (336) 349-5553 or (336) 951-0000   °[facility]   [address] °[place], [place] (336) 349-4454   °[facility] [address],  [place], [place] (336) 342-8316 Insurance/Medicaid/sponsorship through [facility]  °[facility] [address]                                    [place], [place] (336) 342-8316 Therapy/tele-psych/case  °[facility] [address]  ° [place], [place] (336) 349-2233    °Dr. Arfeen  (336) 349-4544   °[facility] [place]  [employer] [place] [facility] 1) [address], [place] °2) [address], [place] °3)  [address], [place] (336) 349-3220 °(336) 342-7768 ° °(336) 342-8140   °[place] [facility] (336) 342-1394 or (336) 342-3537 (After Hours)    ° °  °

## 2014-02-04 NOTE — ED Notes (Signed)
Onset 2 days lightheaded, off balance- pt having to hold onto walls or person when walking, blurred vision both eyes.  Pt had arthroscopy in left knee 12-01-13 has been having pain in knee since, having right knee pain.  Chronic pain in bottom of both feet and swelling in legs/feet.

## 2014-02-04 NOTE — ED Notes (Signed)
Patient transported to CT 

## 2014-02-04 NOTE — ED Provider Notes (Signed)
CSN: [id number]     Arrival date & time 02/04/14  1334 History   First MD Initiated Contact with Patient 02/04/14 1341     No chief complaint on file.    (Consider location/radiation/quality/duration/timing/severity/associated sxs/prior Treatment) HPI  43 year old female with history of anxiety, migraine, anemia presents for evaluation of dizziness. Patient reports for the past 2-3 days she has been having trouble with being off balance and feeling lightheadedness. This symptom seems to be worsening with head movement, and with positional change. States she has to hold walls to person walking. She describes symptoms of lightheadedness and not room spinning. She endorses a mild frontal headache, having facial twitching, experiencing spasm in her back, and feeling weak and tired. She also endorsed ringing in the ears bilaterally for the past few days. Her last menstrual period was June 7. Also endorsed swelling to both of his hand persistent pain to her right knee. Had arthroscopic procedure on her left knee and endorse pain there as well. She denies fever, chills, double vision, neck pain, chest pain, shortness of breath, productive cough, abdominal pain, dysuria, or rash. She has tried to lay down as much as she can to help her with lightheadedness sensation. She is also taking her home medication to help with her pain.  She does have a primary care Dr.  Past Medical History  Diagnosis Date  . Anemia   . Anxiety   . LSIL (low grade squamous intraepithelial lesion) on Pap smear 08/21/2011  . Migraines   . Arthritis    Past Surgical History  Procedure Laterality Date  . Tubal ligation  1993  . Dilation and curretage      after a miscarriage  . Endometrial ablation    . Laparoscopy  03/19/2011    Procedure: LAPAROSCOPY OPERATIVE;  Surgeon: Juliene Pina C. Hulan Fray, MD;  Location: [facility];  Service: Gynecology;  Laterality: N/A;  . Novasure ablation  03/19/2011    Procedure: NOVASURE ABLATION;  Surgeon: Juliene Pina  C. Hulan Fray, MD;  Location: [facility];  Service: Gynecology;  Laterality: N/A;   Family History  Problem Relation Age of Onset  . Diabetes Mother   . Hypertension Mother   . Hyperlipidemia Mother   . Fibromyalgia Mother   . Sleep apnea Mother   . Diabetes Maternal Aunt   . Cancer Maternal Aunt     breast, metastatic cancer throughout body  . Breast cancer Maternal Aunt   . Crohn's disease Maternal Aunt   . Cancer Maternal Uncle     leukemia  . Diabetes Maternal Grandmother   . Cancer Maternal Uncle     colon  . Heart attack Father    History  Substance Use Topics  . Smoking status: Former Smoker -- 2.00 packs/day for 10 years    Types: Cigarettes    Quit date: 05/03/2009  . Smokeless tobacco: Never Used  . Alcohol Use: No     Comment: quit in 2008   OB History   Grav Para Term Preterm Abortions TAB SAB Ect Mult Living   4 3 3  1  1   3      Review of Systems  All other systems reviewed and are negative.     Allergies  Imitrex  Home Medications   Prior to Admission medications   Medication Sig Start Date End Date Taking? Authorizing Provider  ALPRAZolam Duanne Moron) 0.5 MG tablet Take 0.5 mg by mouth 2 (two) times daily as needed for anxiety.    Historical Provider, MD  amitriptyline (ELAVIL) 50 MG tablet Take 50 mg by mouth at bedtime.    Historical Provider, MD  atenolol (TENORMIN) 25 MG tablet Take 25 mg by mouth daily.    Historical Provider, MD  butalbital-acetaminophen-caffeine (FIORICET WITH CODEINE) 50-325-40-30 MG per capsule Take 1 capsule by mouth every 4 (four) hours as needed for headache.    Historical Provider, MD  cholecalciferol (VITAMIN D) 1000 UNITS tablet Take 1,000 Units by mouth daily.    Historical Provider, MD  dicyclomine (BENTYL) 10 MG capsule Take 10 mg by mouth 2 (two) times daily.    Historical Provider, MD  DULoxetine (CYMBALTA) 30 MG capsule Take 1 capsule (30 mg total) by mouth daily. 11/25/13   Philmore Pali, NP  DULoxetine (CYMBALTA) 30 MG  capsule Take 1 capsule (30 mg total) by mouth 2 (two) times daily. 12/25/13   Philmore Pali, NP  HYDROcodone-acetaminophen (NORCO/VICODIN) 5-325 MG per tablet Take 1 tablet by mouth every 6 (six) hours as needed for pain.    Historical Provider, MD  naproxen (NAPROSYN) 500 MG tablet Take 500 mg by mouth 2 (two) times daily with a meal.    Historical Provider, MD  omeprazole (PRILOSEC) 20 MG capsule Take 20 mg by mouth daily.    Historical Provider, MD  ondansetron (ZOFRAN-ODT) 4 MG disintegrating tablet Take 4 mg by mouth every 8 (eight) hours as needed for nausea or vomiting.    Historical Provider, MD  phentermine 37.5 MG capsule Take 37.5 mg by mouth every morning.    Historical Provider, MD   There were no vitals taken for this visit. Physical Exam  Nursing note and vitals reviewed. Constitutional: She is oriented to person, place, and time. She appears well-developed and well-nourished. No distress.  HENT:  Head: Atraumatic.  Right Ear: External ear normal.  Left Ear: External ear normal.  Eyes: Conjunctivae and EOM are normal. Pupils are equal, round, and reactive to light.  Neck: Normal range of motion. Neck supple.  No nuchal rigidity  Cardiovascular: Normal rate and regular rhythm.   Pulmonary/Chest: Effort normal and breath sounds normal.  Abdominal: Soft. There is no tenderness.  Musculoskeletal: She exhibits no edema and no tenderness.  Neurological: She is alert and oriented to person, place, and time.  Neurologic exam:  Speech clear, pupils equal round reactive to light, extraocular movements intact  Normal peripheral visual fields Cranial nerves III through XII normal including no facial droop Follows commands, moves all extremities x4, normal strength to bilateral upper and lower extremities at all major muscle groups including grip Sensation normal to light touch  Coordination intact, no limb ataxia, finger-nose-finger normal Rapid alternating movements normal No pronator  drift Gait mildly antalgic    Skin: No rash noted.  Psychiatric: She has a normal mood and affect.    ED Course  Procedures (including critical care time)  2:12 PM Pt with lightheadedness and disequilibrium.  No focal neuro deficits on exam.  Work up initiated.    4:12 PM Patient with normal orthostatic vital sign, head CT unremarkable, her labs are reassuring. She has normal gait on admission and admitted without difficulty. She is able to eat and drink appropriately. No evidence of significant anemia on today's exam. Pt currently felt her dizziness has resolved after taking meclizine.  Will prescribe meclizine and have pt f/u with PCP for further care.  Doubt posterior circulation stroke or other acute emergent condition.  Return precaution discussed.  Pt stable for discharge.    Labs Review  Labs Reviewed  CBC WITH DIFFERENTIAL - Abnormal; Notable for the following:    Hemoglobin 10.6 (*)    HCT 33.5 (*)    MCH 25.9 (*)    All other components within normal limits  I-STAT CHEM 8, ED - Abnormal; Notable for the following:    Potassium 3.6 (*)    Glucose, Bld 112 (*)    Hemoglobin 11.9 (*)    HCT 35.0 (*)    All other components within normal limits  URINALYSIS, ROUTINE W REFLEX MICROSCOPIC  I-STAT TROPOININ, ED    Imaging Review Ct Head Wo Contrast  02/04/2014   CLINICAL DATA:  Dizziness. Lightheadedness an off balance for 2 days.  EXAM: CT HEAD WITHOUT CONTRAST  TECHNIQUE: Contiguous axial images were obtained from the base of the skull through the vertex without intravenous contrast.  COMPARISON:  None.  FINDINGS: No acute cortical infarct, hemorrhage, or mass lesion is present. The ventricles are of normal size. No significant extra-axial fluid collection is evident. The paranasal sinuses and mastoid air cells are clear. The osseous skull is intact. E  IMPRESSION: Negative CT of the head.   Electronically Signed   By: Lawrence Santiago M.D.   On: 02/04/2014 14:45     EKG  Interpretation None      Date: 02/04/2014  Rate: 89  Rhythm: normal sinus rhythm  QRS Axis: normal  Intervals: normal  ST/T Wave abnormalities: normal  Conduction Disutrbances: none  Narrative Interpretation:   Old EKG Reviewed: No significant changes noted     MDM   Final diagnoses:  Dizziness    BP 104/61  Pulse 90  Temp(Src) 98.1 F (36.7 C) (Oral)  Resp 28  SpO2 100%  LMP 01/08/2014  I have reviewed nursing notes and vital signs. I personally reviewed the imaging tests through PACS system  I reviewed available ER/hospitalization records thought the EMR     Domenic Moras, [place] 02/04/14 1627

## 2014-02-04 NOTE — ED Provider Notes (Signed)
Medical screening examination/treatment/procedure(s) were performed by non-physician practitioner and as supervising physician I was immediately available for consultation/collaboration.   EKG Interpretation None        Maudry Diego, MD 02/04/14 [phone]

## 2014-02-15 ENCOUNTER — Encounter (HOSPITAL_COMMUNITY): Payer: Self-pay | Admitting: Emergency Medicine

## 2014-02-15 ENCOUNTER — Emergency Department (HOSPITAL_COMMUNITY)
Admission: EM | Admit: 2014-02-15 | Discharge: 2014-02-15 | Disposition: A | Discharge status: Home / Self Care | Payer: BC Managed Care – PPO | Attending: Emergency Medicine | Admitting: Emergency Medicine

## 2014-02-15 DIAGNOSIS — Z791 Long term (current) use of non-steroidal anti-inflammatories (NSAID): Secondary | ICD-10-CM | POA: Insufficient documentation

## 2014-02-15 DIAGNOSIS — G43009 Migraine without aura, not intractable, without status migrainosus: Secondary | ICD-10-CM

## 2014-02-15 DIAGNOSIS — Z8709 Personal history of other diseases of the respiratory system: Secondary | ICD-10-CM | POA: Insufficient documentation

## 2014-02-15 DIAGNOSIS — Z8719 Personal history of other diseases of the digestive system: Secondary | ICD-10-CM | POA: Insufficient documentation

## 2014-02-15 DIAGNOSIS — R110 Nausea: Secondary | ICD-10-CM | POA: Insufficient documentation

## 2014-02-15 DIAGNOSIS — Z9889 Other specified postprocedural states: Secondary | ICD-10-CM | POA: Insufficient documentation

## 2014-02-15 DIAGNOSIS — Z79899 Other long term (current) drug therapy: Secondary | ICD-10-CM | POA: Insufficient documentation

## 2014-02-15 DIAGNOSIS — M549 Dorsalgia, unspecified: Secondary | ICD-10-CM | POA: Insufficient documentation

## 2014-02-15 DIAGNOSIS — Z87891 Personal history of nicotine dependence: Secondary | ICD-10-CM | POA: Insufficient documentation

## 2014-02-15 DIAGNOSIS — F411 Generalized anxiety disorder: Secondary | ICD-10-CM | POA: Insufficient documentation

## 2014-02-15 DIAGNOSIS — M129 Arthropathy, unspecified: Secondary | ICD-10-CM | POA: Insufficient documentation

## 2014-02-15 DIAGNOSIS — G8929 Other chronic pain: Secondary | ICD-10-CM

## 2014-02-15 DIAGNOSIS — M25569 Pain in unspecified knee: Secondary | ICD-10-CM | POA: Insufficient documentation

## 2014-02-15 DIAGNOSIS — Z862 Personal history of diseases of the blood and blood-forming organs and certain disorders involving the immune mechanism: Secondary | ICD-10-CM | POA: Insufficient documentation

## 2014-02-15 DIAGNOSIS — M62830 Muscle spasm of back: Secondary | ICD-10-CM

## 2014-02-15 MED ORDER — METOCLOPRAMIDE HCL 5 MG/ML IJ SOLN
10.0000 mg | Freq: Once | INTRAMUSCULAR | Status: AC
  Administered 2014-02-15: 10 mg via INTRAVENOUS
  Filled 2014-02-15: qty 2

## 2014-02-15 MED ORDER — DIPHENHYDRAMINE HCL 50 MG/ML IJ SOLN
25.0000 mg | Freq: Once | INTRAMUSCULAR | Status: AC
  Administered 2014-02-15: 25 mg via INTRAVENOUS
  Filled 2014-02-15: qty 1

## 2014-02-15 MED ORDER — SODIUM CHLORIDE 0.9 % IV BOLUS (SEPSIS)
1000.0000 mL | Freq: Once | INTRAVENOUS | Status: AC
  Administered 2014-02-15: 1000 mL via INTRAVENOUS

## 2014-02-15 MED ORDER — CYCLOBENZAPRINE HCL 10 MG PO TABS: 10.0000 mg | ORAL_TABLET | Freq: Three times a day (TID) | ORAL | Status: DC | PRN

## 2014-02-15 MED ORDER — KETOROLAC TROMETHAMINE 30 MG/ML IJ SOLN
30.0000 mg | Freq: Once | INTRAMUSCULAR | Status: AC
  Administered 2014-02-15: 30 mg via INTRAVENOUS
  Filled 2014-02-15: qty 1

## 2014-02-15 MED ORDER — CYCLOBENZAPRINE HCL 10 MG PO TABS
10.0000 mg | ORAL_TABLET | Freq: Once | ORAL | Status: AC
  Administered 2014-02-15: 10 mg via ORAL
  Filled 2014-02-15: qty 1

## 2014-02-15 NOTE — ED Provider Notes (Signed)
CSN: [id number]     Arrival date & time 02/15/14  1023 History   First MD Initiated Contact with Patient 02/15/14 1105     Chief Complaint  Patient presents with  . Migraine     (Consider location/radiation/quality/duration/timing/severity/associated sxs/prior Treatment) HPI Patient reports she has a long history of migraine headaches. She states she used to get them 4-5 times a month and then they seemed to get better. However she states recently it is starting to happen more frequently. She states she starts getting a light headache about every 1-1/2 weeks and sometimes they progress to a migraine. She states yesterday she started getting a diffuse headache was the worst pain in the frontal area and behind her eyes. She describes the pain as throbbing. She has had nausea without vomiting. She states her vision has been blurred. She has numbness and tingling in her fingers but that is old from carpal tunnel problems. She states she has photosensitivity and noise sensitivity. She states nothing makes her headaches better and she has not tried any medications. She states this is like her prior headaches. She states 2 days ago she had some twitching of her left face and eye but that has resolved.  Patient also has chronic knee pain. She states Dr. Lysle Rubens was managing her pain however she lost her insurance. She missed an appointment with him 2 days ago. She states she has called Dr. Rhona Raider her orthopedist and he is going to prescribe her pain medication.  PCP Dr Lysle Rubens  Missed her appt 2 days ago  (? Dr Sheryle Hail who has prescribed her pain medications). Orthopedist Dr Rhona Raider Neurologist Dr. Leta Baptist  Past Medical History  Diagnosis Date  . Anemia   . Anxiety   . LSIL (low grade squamous intraepithelial lesion) on Pap smear 08/21/2011  . Migraines   . Arthritis   . Bronchitis   . IBS (irritable bowel syndrome)   . Peripheral neuropathic pain     in hands   Past Surgical History   Procedure Laterality Date  . Tubal ligation  1993  . Dilation and curretage      after a miscarriage  . Endometrial ablation    . Laparoscopy  03/19/2011    Procedure: LAPAROSCOPY OPERATIVE;  Surgeon: Juliene Pina C. Hulan Fray, MD;  Location: [facility];  Service: Gynecology;  Laterality: N/A;  . Novasure ablation  03/19/2011    Procedure: NOVASURE ABLATION;  Surgeon: Juliene Pina C. Hulan Fray, MD;  Location: [facility];  Service: Gynecology;  Laterality: N/A;  . Knee arthroscopy Left 12-01-13   Family History  Problem Relation Age of Onset  . Diabetes Mother   . Hypertension Mother   . Hyperlipidemia Mother   . Fibromyalgia Mother   . Sleep apnea Mother   . Diabetes Maternal Aunt   . Cancer Maternal Aunt     breast, metastatic cancer throughout body  . Breast cancer Maternal Aunt   . Crohn's disease Maternal Aunt   . Cancer Maternal Uncle     leukemia  . Diabetes Maternal Grandmother   . Cancer Maternal Uncle     colon  . Heart attack Father    History  Substance Use Topics  . Smoking status: Former Smoker -- 2.00 packs/day for 10 years    Types: Cigarettes    Quit date: 05/03/2009  . Smokeless tobacco: Never Used  . 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     Allergies  Imitrex  Home Medications   Prior to Admission medications   Medication Sig Start Date End Date Taking? Authorizing Provider  ALPRAZolam Duanne Moron) 0.5 MG tablet Take 0.5 mg by mouth at bedtime.    Yes Historical Provider, MD  amitriptyline (ELAVIL) 50 MG tablet Take 50 mg by mouth at bedtime.   Yes Historical Provider, MD  atenolol (TENORMIN) 25 MG tablet Take 25 mg by mouth daily.   Yes Historical Provider, MD  dicyclomine (BENTYL) 10 MG capsule Take 10 mg by mouth 2 (two) times daily.   Yes Historical Provider, MD   HYDROcodone-acetaminophen (NORCO) 10-325 MG per tablet Take 1 tablet by mouth 3 (three) times daily.   Yes Historical Provider, MD  naproxen (NAPROSYN) 500 MG tablet Take 500 mg by mouth 2 (two) times daily with a meal.   Yes Historical Provider, MD  omeprazole (PRILOSEC) 20 MG capsule Take 20 mg by mouth daily.   Yes Historical Provider, MD  albuterol (PROVENTIL HFA;VENTOLIN HFA) 108 (90 BASE) MCG/ACT inhaler Inhale 1-2 puffs into the lungs every 6 (six) hours as needed for wheezing or shortness of breath.    Historical Provider, MD   BP 136/86  Pulse 85  Temp(Src) 98.5 F (36.9 C) (Oral)  Resp 20  Wt 234 lb (106.142 kg)  SpO2 100%  LMP 01/29/2014  Vital signs normal   Physical Exam  Nursing note and vitals reviewed. Constitutional: She is oriented to person, place, and time. She appears well-developed and well-nourished.  Non-toxic appearance. She does not appear ill. She appears distressed.  Sitting in the dark  HENT:  Head: Normocephalic and atraumatic.  Right Ear: External ear normal.  Left Ear: External ear normal.  Nose: Nose normal. No mucosal edema or rhinorrhea.  Mouth/Throat: Oropharynx is clear and moist and mucous membranes are normal. No dental abscesses or uvula swelling.  Eyes: Conjunctivae and EOM are normal. Pupils are equal, round, and reactive to light.  Neck: Normal range of motion and full passive range of motion without pain. Neck supple.  Cardiovascular: Normal rate, regular rhythm and normal heart sounds.  Exam reveals no gallop and no friction rub.   No murmur heard. Pulmonary/Chest: Effort normal and breath sounds normal. No respiratory distress. She has no wheezes. She has no rhonchi. She has no rales. She exhibits no tenderness and no crepitus.  Abdominal: Soft. Normal appearance and bowel sounds are normal. She exhibits no distension. There is no tenderness. There is no rebound and no guarding.  Musculoskeletal: Normal range of motion. She exhibits no  edema and no tenderness.  Moves all extremities well. C/O muscle spasms in her lower back with tenderness  Neurological: She is alert and oriented to person, place, and time. She has normal strength. No cranial nerve deficit.  Skin: Skin is warm, dry and intact. No rash noted. No erythema. No pallor.  Psychiatric: She has a normal mood and affect. Her speech is normal and behavior is normal. Her mood appears not anxious.    ED Course  Procedures (including critical care time)  Medications  sodium chloride 0.9 % bolus 1,000 mL (1,000 mLs Intravenous New Bag/Given 02/15/14 1152)  ketorolac (TORADOL) 30 MG/ML injection 30 mg (30 mg Intravenous Given 02/15/14 1151)  metoCLOPramide (REGLAN) injection 10  mg (10 mg Intravenous Given 02/15/14 1151)  diphenhydrAMINE (BENADRYL) injection 25 mg (25 mg Intravenous Given 02/15/14 1151)  cyclobenzaprine (FLEXERIL) tablet 10 mg (10 mg Oral Given 02/15/14 1151)   I discussed with patient that she would need to get a doctor to prescribe her chronic pain medication. She states she has already contacted Dr. Rhona Raider who knows about her chronic knee pain.  Recheck at discharge, patient states her headache is much better and almost gone. Also states her back spasms are better.   Review of the [place] shows patient has been getting 7 narcotic prescriptions since January. Five of the  prescriptions are from Dr. Sheryle Hail in [place]. She last received #90 hydrocodone 10/325 on 6/15, #81 hydrocodone 5/325 on 5/20, #90 hydrocodone 5/325 on April 21, #90 hydrocodone 5/325 on March 23,# 80 hydrocodone 5/325 on February 20, and # 70 hydrocodone 3/325  on January 22. In addition she received #81 hydrocodone 5/325 on May 20 from [place] in advance, and #40 oxycodone 5/325 from Dr. Rhona Raider on May 6.  Labs Review Labs Reviewed - No data to display  Imaging Review No results found.   EKG Interpretation None      MDM   Final diagnoses:  Migraine  without aura and without status migrainosus, not intractable  Muscle spasm of back  Chronic back pain  Chronic knee pain, unspecified laterality    New Prescriptions   CYCLOBENZAPRINE (FLEXERIL) 10 MG TABLET    Take 1 tablet (10 mg total) by mouth 3 (three) times daily as needed for muscle spasms.    Plan discharge   Rolland Porter, MD, Alanson Aly, MD 02/15/14 1324

## 2014-02-15 NOTE — Discharge Instructions (Signed)
Go home and rest. Drink plenty of fluids. Take the flexeril for your back muscle spasms. You will need to get a primary care doctor. Please ask Dr Rhona Raider to get your pain medications that you need.   Recheck as neede.  Recurrent Migraine Headache A migraine headache is an intense, throbbing pain on one or both sides of your head. Recurrent migraines keep coming back. A migraine can last for 30 minutes to several hours. CAUSES  The exact cause of a migraine headache is not always known. However, a migraine may be caused when nerves in the brain become irritated and release chemicals that cause inflammation. This causes pain. Certain things may also trigger migraines, such as:   Alcohol.  Smoking.  Stress.  Menstruation.  Aged cheeses.  Foods or drinks that contain nitrates, glutamate, aspartame, or tyramine.  Lack of sleep.  Chocolate.  Caffeine.  Hunger.  Physical exertion.  Fatigue.  Medicines used to treat chest pain (nitroglycerine), birth control pills, estrogen, and some blood pressure medicines. SYMPTOMS   Pain on one or both sides of your head.  Pulsating or throbbing pain.  Severe pain that prevents daily activities.  Pain that is aggravated by any physical activity.  Nausea, vomiting, or both.  Dizziness.  Pain with exposure to bright lights, loud noises, or activity.  General sensitivity to bright lights, loud noises, or smells. Before you get a migraine, you may get warning signs that a migraine is coming (aura). An aura may include:  Seeing flashing lights.  Seeing bright spots, halos, or zig-zag lines.  Having tunnel vision or blurred vision.  Having feelings of numbness or tingling.  Having trouble talking.  Having muscle weakness. DIAGNOSIS  A recurrent migraine headache is often diagnosed based on:  Symptoms.  Physical examination.  A CT scan or MRI of your head. These imaging tests cannot diagnose migraines but can help rule out  other causes of headaches.  TREATMENT  Medicines may be given for pain and nausea. Medicines can also be given to help prevent recurrent migraines. HOME CARE INSTRUCTIONS  Only take over-the-counter or prescription medicines for pain or discomfort as directed by your health care provider. The use of long-term narcotics is not recommended.  Lie down in a dark, quiet room when you have a migraine.  Keep a journal to find out what may trigger your migraine headaches. For example, write down:  What you eat and drink.  How much sleep you get.  Any change to your diet or medicines.  Limit alcohol consumption.  Quit smoking if you smoke.  Get 7-9 hours of sleep, or as recommended by your health care provider.  Limit stress.  Keep lights dim if bright lights bother you and make your migraines worse. SEEK MEDICAL CARE IF:   You do not get relief from the medicines given to you.  You have a recurrence of pain.  You have a fever. SEEK IMMEDIATE MEDICAL CARE IF:  Your migraine becomes severe.  You have a stiff neck.  You have loss of vision.  You have muscular weakness or loss of muscle control.  You start losing your balance or have trouble walking.  You feel faint or pass out.  You have severe symptoms that are different from your first symptoms. MAKE SURE YOU:   Understand these instructions.  Will watch your condition.  Will get help right away if you are not doing well or get worse. Document Released: 04/15/2001 Document Revised: 07/26/2013 Document Reviewed: 03/28/2013 ExitCare  Patient Information 2015 [place]. This information is not intended to replace advice given to you by your health care provider. Make sure you discuss any questions you have with your health care provider.   Chronic Pain Discharge Instructions  Emergency care providers appreciate that many patients coming to [place] are in severe pain and we wish to address their pain in the safest,  most responsible manner.  It is important to recognize however, that the proper treatment of chronic pain differs from that of the pain of injuries and acute illnesses.  Our goal is to provide quality, safe, personalized care and we thank you for giving [place] the opportunity to serve you. The use of narcotics and related agents for chronic pain syndromes may lead to additional physical and psychological problems.  Nearly as many people die from prescription narcotics each year as die from car crashes.  Additionally, this risk is increased if such prescriptions are obtained from a variety of sources.  Therefore, only your primary care physician or a pain management specialist is able to safely treat such syndromes with narcotic medications long-term.    Documentation revealing such prescriptions have been sought from multiple sources may prohibit [place] from providing a refill or different narcotic medication.  Your name may be checked first through the [place].  This database is a record of controlled substance medication prescriptions that the patient has received.  This has been established by [facility] in an effort to eliminate the dangerous, and often life threatening, practice of obtaining multiple prescriptions from different medical providers.   If you have a chronic pain syndrome (i.e. chronic headaches, recurrent back or neck pain, dental pain, abdominal or pelvis pain without a specific diagnosis, or neuropathic pain such as fibromyalgia) or recurrent visits for the same condition without an acute diagnosis, you may be treated with non-narcotics and other non-addictive medicines.  Allergic reactions or negative side effects that may be reported by a patient to such medications will not typically lead to the use of a narcotic analgesic or other controlled substance as an alternative.   Patients managing chronic pain with a personal physician should have  provisions in place for breakthrough pain.  If you are in crisis, you should call your physician.  If your physician directs you to the emergency department, please have the doctor call and speak to our attending physician concerning your care.   When patients come to the Emergency Department (ED) with acute medical conditions in which the Emergency Department physician feels appropriate to prescribe narcotic or sedating pain medication, the physician will prescribe these in very limited quantities.  The amount of these medications will last only until you can see your primary care physician in his/her office.  Any patient who returns to the ED seeking refills should expect only non-narcotic pain medications.   In the event of an acute medical condition exists and the emergency physician feels it is necessary that the patient be given a narcotic or sedating medication -  a responsible adult driver should be present in the room prior to the medication being given by the nurse.   Prescriptions for narcotic or sedating medications that have been lost, stolen or expired will not be refilled in the Emergency Department.    Patients who have chronic pain may receive non-narcotic prescriptions until seen by their primary care physician.  It is every patients personal responsibility to maintain active prescriptions with his or her primary care physician  or specialist.

## 2014-02-15 NOTE — ED Notes (Signed)
Pt c/o dizziness, photophobia, nausea. States typical migraine pt is out of medications related to finical reasons.

## 2014-02-15 NOTE — ED Notes (Signed)
Pt in c/o migraine since yesterday morning, history of same, c/o photophobia, seen here last week for same, also c/o lower back pain

## 2014-02-17 ENCOUNTER — Ambulatory Visit: Payer: Self-pay | Attending: Internal Medicine | Admitting: Internal Medicine

## 2014-02-17 ENCOUNTER — Encounter: Payer: Self-pay | Admitting: Internal Medicine

## 2014-02-17 VITALS — BP 140/90 | HR 114 | Temp 98.2°F | Resp 16 | Ht 63.0 in | Wt 239.5 lb

## 2014-02-17 DIAGNOSIS — R220 Localized swelling, mass and lump, head: Secondary | ICD-10-CM | POA: Insufficient documentation

## 2014-02-17 DIAGNOSIS — Z7189 Other specified counseling: Secondary | ICD-10-CM

## 2014-02-17 DIAGNOSIS — Z131 Encounter for screening for diabetes mellitus: Secondary | ICD-10-CM | POA: Insufficient documentation

## 2014-02-17 DIAGNOSIS — Z87891 Personal history of nicotine dependence: Secondary | ICD-10-CM | POA: Insufficient documentation

## 2014-02-17 DIAGNOSIS — E785 Hyperlipidemia, unspecified: Secondary | ICD-10-CM | POA: Insufficient documentation

## 2014-02-17 DIAGNOSIS — R221 Localized swelling, mass and lump, neck: Secondary | ICD-10-CM

## 2014-02-17 DIAGNOSIS — Z0000 Encounter for general adult medical examination without abnormal findings: Secondary | ICD-10-CM | POA: Insufficient documentation

## 2014-02-17 DIAGNOSIS — Z7689 Persons encountering health services in other specified circumstances: Secondary | ICD-10-CM

## 2014-02-17 DIAGNOSIS — I10 Essential (primary) hypertension: Secondary | ICD-10-CM | POA: Insufficient documentation

## 2014-02-17 LAB — POCT GLYCOSYLATED HEMOGLOBIN (HGB A1C): Hemoglobin A1C: 5.6

## 2014-02-17 LAB — GLUCOSE, POCT (MANUAL RESULT ENTRY): POC GLUCOSE: 131 mg/dL — AB (ref 70–99)
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
## 2014-02-17 NOTE — Patient Instructions (Signed)
Generalized Anxiety Disorder Generalized anxiety disorder (GAD) is a mental disorder. It interferes with life functions, including relationships, work, and school. GAD is different from normal anxiety, which everyone experiences at some point in their lives in response to specific life events and activities. Normal anxiety actually helps [place] prepare for and get through these life events and activities. Normal anxiety goes away after the event or activity is over.  GAD causes anxiety that is not necessarily related to specific events or activities. It also causes excess anxiety in proportion to specific events or activities. The anxiety associated with GAD is also difficult to control. GAD can vary from mild to severe. People with severe GAD can have intense waves of anxiety with physical symptoms (panic attacks).  SYMPTOMS The anxiety and worry associated with GAD are difficult to control. This anxiety and worry are related to many life events and activities and also occur more days than not for 6 months or longer. People with GAD also have three or more of the following symptoms (one or more in children):  Restlessness.   Fatigue.  Difficulty concentrating.   Irritability.  Muscle tension.  Difficulty sleeping or unsatisfying sleep. DIAGNOSIS GAD is diagnosed through an assessment by your caregiver. Your caregiver will ask you questions aboutyour mood,physical symptoms, and events in your life. Your caregiver may ask you about your medical history and use of alcohol or drugs, including prescription medications. Your caregiver may also do a physical exam and blood tests. Certain medical conditions and the use of certain substances can cause symptoms similar to those associated with GAD. Your caregiver may refer you to a mental health specialist for further evaluation. TREATMENT The following therapies are usually used to treat GAD:   Medication--Antidepressant medication usually is  prescribed for long-term daily control. Antianxiety medications may be added in severe cases, especially when panic attacks occur.   Talk therapy (psychotherapy)--Certain types of talk therapy can be helpful in treating GAD by providing support, education, and guidance. A form of talk therapy called cognitive behavioral therapy can teach you healthy ways to think about and react to daily life events and activities.  Stress managementtechniques--These include yoga, meditation, and exercise and can be very helpful when they are practiced regularly. A mental health specialist can help determine which treatment is best for you. Some people see improvement with one therapy. However, other people require a combination of therapies. Document Released: 11/15/2012 Document Reviewed: 11/15/2012 [facility] Patient Information 2015 [place]. This information is not intended to replace advice given to you by your health care provider. Make sure you discuss any questions you have with your health care provider. DASH Eating Plan DASH stands for "Dietary Approaches to Stop Hypertension." The DASH eating plan is a healthy eating plan that has been shown to reduce high blood pressure (hypertension). Additional health benefits may include reducing the risk of type 2 diabetes mellitus, heart disease, and stroke. The DASH eating plan may also help with weight loss. WHAT DO I NEED TO KNOW ABOUT THE DASH EATING PLAN? For the DASH eating plan, you will follow these general guidelines:  Choose foods with a percent daily value for sodium of less than 5% (as listed on the food label).  Use salt-free seasonings or herbs instead of table salt or sea salt.  Check with your health care provider or pharmacist before using salt substitutes.  Eat lower-sodium products, often labeled as "lower sodium" or "no salt added."  Eat fresh foods.  Eat more vegetables,  fruits, and low-fat dairy products.  Choose whole grains.  Look for the word "whole" as the first word in the ingredient list.  Choose fish and skinless chicken or [place] more often than red meat. Limit fish, poultry, and meat to 6 oz (170 g) each day.  Limit sweets, desserts, sugars, and sugary drinks.  Choose heart-healthy fats.  Limit cheese to 1 oz (28 g) per day.  Eat more home-cooked food and less restaurant, buffet, and fast food.  Limit fried foods.  Cook foods using methods other than frying.  Limit canned vegetables. If you do use them, rinse them well to decrease the sodium.  When eating at a restaurant, ask that your food be prepared with less salt, or no salt if possible. WHAT FOODS CAN I EAT? Seek help from a dietitian for individual calorie needs. Grains Whole grain or whole wheat bread. Brown rice. Whole grain or whole wheat pasta. Quinoa, bulgur, and whole grain cereals. Low-sodium cereals. Corn or whole wheat flour tortillas. Whole grain cornbread. Whole grain crackers. Low-sodium crackers. Vegetables Fresh or frozen vegetables (raw, steamed, roasted, or grilled). Low-sodium or reduced-sodium tomato and vegetable juices. Low-sodium or reduced-sodium tomato sauce and paste. Low-sodium or reduced-sodium canned vegetables.  Fruits All fresh, canned (in natural juice), or frozen fruits. Meat and Other Protein Products Ground beef (85% or leaner), grass-fed beef, or beef trimmed of fat. Skinless chicken or [place]. Ground chicken or [place]. Pork trimmed of fat. All fish and seafood. Eggs. Dried beans, peas, or lentils. Unsalted nuts and seeds. Unsalted canned beans. Dairy Low-fat dairy products, such as skim or 1% milk, 2% or reduced-fat cheeses, low-fat ricotta or cottage cheese, or plain low-fat yogurt. Low-sodium or reduced-sodium cheeses. Fats and Oils Tub margarines without trans fats. Light or reduced-fat mayonnaise and salad dressings (reduced sodium). Avocado. Safflower, olive, or canola oils. Natural peanut or almond  butter. Other Unsalted popcorn and pretzels. The items listed above may not be a complete list of recommended foods or beverages. Contact your dietitian for more options. WHAT FOODS ARE NOT RECOMMENDED? Grains White bread. White pasta. White rice. Refined cornbread. Bagels and croissants. Crackers that contain trans fat. Vegetables Creamed or fried vegetables. Vegetables in a cheese sauce. Regular canned vegetables. Regular canned tomato sauce and paste. Regular tomato and vegetable juices. Fruits Dried fruits. Canned fruit in light or heavy syrup. Fruit juice. Meat and Other Protein Products Fatty cuts of meat. Ribs, chicken wings, bacon, sausage, bologna, salami, chitterlings, fatback, hot dogs, bratwurst, and packaged luncheon meats. Salted nuts and seeds. Canned beans with salt. Dairy Whole or 2% milk, cream, half-and-half, and cream cheese. Whole-fat or sweetened yogurt. Full-fat cheeses or blue cheese. Nondairy creamers and whipped toppings. Processed cheese, cheese spreads, or cheese curds. Condiments Onion and garlic salt, seasoned salt, table salt, and sea salt. Canned and packaged gravies. Worcestershire sauce. Tartar sauce. Barbecue sauce. Teriyaki sauce. Soy sauce, including reduced sodium. Steak sauce. Fish sauce. Oyster sauce. Cocktail sauce. Horseradish. Ketchup and mustard. Meat flavorings and tenderizers. Bouillon cubes. Hot sauce. Tabasco sauce. Marinades. Taco seasonings. Relishes. Fats and Oils Butter, stick margarine, lard, shortening, ghee, and bacon fat. Coconut, palm kernel, or palm oils. Regular salad dressings. Other Pickles and olives. Salted popcorn and pretzels. The items listed above may not be a complete list of foods and beverages to avoid. Contact your dietitian for more information. WHERE CAN I FIND MORE INFORMATION? National Heart, Lung, and Blood Institute: travelstabloid.com Document Released: 07/10/2011 Document Revised:  07/26/2013 Document Reviewed: 05/25/2013  [employer] Patient Information 2015 [employer]. This information is not intended to replace advice given to you by your health care provider. Make sure you discuss any questions you have with your health care provider.  

## 2014-02-17 NOTE — Progress Notes (Signed)
Patient here to establish care.  Patient does c/o having back spasms was seeing ortho but because of insurance being dropped can no longer Dr. Rhona Raider  Patient was seeing a neuro for peripheral nueropathy had nerve conductive test done and they were going to follow up on migraines can not go back to neuro as well because of insurance.  Patient would like medications switched to [facility].

## 2014-02-20 ENCOUNTER — Ambulatory Visit (HOSPITAL_COMMUNITY)
Admission: RE | Admit: 2014-02-20 | Discharge: 2014-02-20 | Disposition: A | Discharge status: Home / Self Care | Payer: BC Managed Care – PPO | Source: Ambulatory Visit | Attending: Internal Medicine | Admitting: Internal Medicine

## 2014-02-20 ENCOUNTER — Ambulatory Visit: Payer: BC Managed Care – PPO | Attending: Internal Medicine

## 2014-02-20 DIAGNOSIS — R220 Localized swelling, mass and lump, head: Secondary | ICD-10-CM | POA: Insufficient documentation

## 2014-02-20 DIAGNOSIS — Z7689 Persons encountering health services in other specified circumstances: Secondary | ICD-10-CM

## 2014-02-20 DIAGNOSIS — R221 Localized swelling, mass and lump, neck: Secondary | ICD-10-CM

## 2014-02-20 DIAGNOSIS — R229 Localized swelling, mass and lump, unspecified: Secondary | ICD-10-CM | POA: Insufficient documentation

## 2014-02-20 LAB — LIPID PANEL
CHOLESTEROL: 181 mg/dL (ref 0–200)
HDL: 56 mg/dL (ref >39)
LDL CALC: 99 mg/dL (ref 0–99)
Total CHOL/HDL Ratio: 3.2 Ratio
Triglycerides: 129 mg/dL (ref <150)
VLDL: 26 mg/dL (ref 0–40)

## 2014-02-20 LAB — TSH: TSH: 4.508 u[IU]/mL — ABNORMAL HIGH (ref 0.350–4.500)

## 2014-02-21 ENCOUNTER — Telehealth: Payer: Self-pay | Admitting: *Deleted

## 2014-02-21 DIAGNOSIS — R7989 Other specified abnormal findings of blood chemistry: Secondary | ICD-10-CM

## 2014-02-21 NOTE — Telephone Encounter (Signed)
Message copied by Velora Heckler on Tue Feb 21, 2014  4:16 PM ------      Message from: Chari Manning A      Created: Tue Feb 21, 2014  1:52 PM       Lippid panel normal. Please have patient come back for free T4, her TSH is slightly elevated. Thanks ------

## 2014-02-21 NOTE — Telephone Encounter (Signed)
Left message on patient's home VM to return call to discuss lab results.  Order for Free T4 placed

## 2014-02-21 NOTE — Telephone Encounter (Signed)
Message copied by Velora Heckler on Tue Feb 21, 2014  4:28 PM ------      Message from: Chari Manning A      Created: Tue Feb 21, 2014  1:52 PM       Lippid panel normal. Please have patient come back for free T4, her TSH is slightly elevated. Thanks ------

## 2014-02-22 ENCOUNTER — Telehealth: Payer: Self-pay | Admitting: *Deleted

## 2014-02-22 NOTE — Telephone Encounter (Signed)
Left message on patient's home VM to return call to discuss lab and ultrasound results.

## 2014-02-22 NOTE — Telephone Encounter (Signed)
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 Thanks ------

## 2014-02-22 NOTE — Telephone Encounter (Signed)
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 Thanks ------

## 2014-02-24 ENCOUNTER — Encounter: Payer: Self-pay | Admitting: General Practice

## 2014-03-01 ENCOUNTER — Encounter: Payer: Self-pay | Admitting: General Practice

## 2014-03-06 ENCOUNTER — Emergency Department (HOSPITAL_COMMUNITY)
Admission: EM | Admit: 2014-03-06 | Discharge: 2014-03-06 | Disposition: A | Discharge status: Home / Self Care | Payer: BC Managed Care – PPO | Attending: Emergency Medicine | Admitting: Emergency Medicine

## 2014-03-06 ENCOUNTER — Encounter (HOSPITAL_COMMUNITY): Payer: Self-pay | Admitting: Emergency Medicine

## 2014-03-06 DIAGNOSIS — F411 Generalized anxiety disorder: Secondary | ICD-10-CM | POA: Insufficient documentation

## 2014-03-06 DIAGNOSIS — E119 Type 2 diabetes mellitus without complications: Secondary | ICD-10-CM | POA: Insufficient documentation

## 2014-03-06 DIAGNOSIS — M129 Arthropathy, unspecified: Secondary | ICD-10-CM | POA: Insufficient documentation

## 2014-03-06 DIAGNOSIS — G609 Hereditary and idiopathic neuropathy, unspecified: Secondary | ICD-10-CM | POA: Insufficient documentation

## 2014-03-06 DIAGNOSIS — R000 Tachycardia, unspecified: Secondary | ICD-10-CM | POA: Insufficient documentation

## 2014-03-06 DIAGNOSIS — R059 Cough, unspecified: Secondary | ICD-10-CM | POA: Insufficient documentation

## 2014-03-06 DIAGNOSIS — Z791 Long term (current) use of non-steroidal anti-inflammatories (NSAID): Secondary | ICD-10-CM | POA: Insufficient documentation

## 2014-03-06 DIAGNOSIS — J029 Acute pharyngitis, unspecified: Secondary | ICD-10-CM | POA: Insufficient documentation

## 2014-03-06 DIAGNOSIS — G43909 Migraine, unspecified, not intractable, without status migrainosus: Secondary | ICD-10-CM | POA: Insufficient documentation

## 2014-03-06 DIAGNOSIS — Z87891 Personal history of nicotine dependence: Secondary | ICD-10-CM | POA: Insufficient documentation

## 2014-03-06 DIAGNOSIS — Z862 Personal history of diseases of the blood and blood-forming organs and certain disorders involving the immune mechanism: Secondary | ICD-10-CM | POA: Insufficient documentation

## 2014-03-06 DIAGNOSIS — J020 Streptococcal pharyngitis: Secondary | ICD-10-CM | POA: Insufficient documentation

## 2014-03-06 DIAGNOSIS — K589 Irritable bowel syndrome without diarrhea: Secondary | ICD-10-CM | POA: Insufficient documentation

## 2014-03-06 DIAGNOSIS — R05 Cough: Secondary | ICD-10-CM | POA: Insufficient documentation

## 2014-03-06 DIAGNOSIS — Z79899 Other long term (current) drug therapy: Secondary | ICD-10-CM | POA: Insufficient documentation

## 2014-03-06 LAB — RAPID STREP SCREEN (MED CTR MEBANE ONLY): Streptococcus, Group A Screen (Direct): POSITIVE — AB

## 2014-03-06 LAB — CBG MONITORING, ED: Glucose-Capillary: 94 mg/dL (ref 70–99)

## 2014-03-06 MED ORDER — ACETAMINOPHEN 160 MG/5ML PO SOLN
650.0000 mg | ORAL | Status: AC
  Administered 2014-03-06: 650 mg via ORAL
  Filled 2014-03-06: qty 20.3

## 2014-03-06 MED ORDER — DEXAMETHASONE SODIUM PHOSPHATE 10 MG/ML IJ SOLN
10.0000 mg | Freq: Once | INTRAMUSCULAR | Status: AC
  Administered 2014-03-06: 10 mg via INTRAMUSCULAR
  Filled 2014-03-06: qty 1

## 2014-03-06 MED ORDER — PENICILLIN G BENZATHINE 1200000 UNIT/2ML IM SUSP
1.2000 10*6.[IU] | Freq: Once | INTRAMUSCULAR | Status: AC
  Administered 2014-03-06: 1.2 10*6.[IU] via INTRAMUSCULAR
  Filled 2014-03-06: qty 2

## 2014-03-06 NOTE — ED Provider Notes (Signed)
CSN: [id number]     Arrival date & time 03/06/14  1013 History  This chart was scribed for non-physician practitioner, Alecia Lemming, PA-C working with Richarda Blade, MD, by Erling Conte, ED Scribe. This patient was seen in room TR05C/TR05C and the patient's care was started at 11:25 AM.    Chief Complaint  Patient presents with  . Sore Throat  . Oral Swelling    The history is provided by the patient. No language interpreter was used.   HPI Comments: Gloria Hall is a 43 y.o. female with a h/o DM, anemia, bronchitis, and PNP who presents to the Emergency Department complaining of a constant, gradually worsening, "10/10" sore throat since yesterday morning. Patient states she is having associated mild cough, sweats, and difficulty swallowing. She states she regularly takes Gabapentin, Naprosyn, Proventil and Tenormin. She denies taking those meds today because she states she has been unable to swallow. She states she currently does not take anything for her DM. She also denies monitoring her BS reguarly. She denies any fever, chills, abdominal pain, nausea, emesis or diarrhea.     Past Medical History  Diagnosis Date  . Anemia   . Anxiety   . LSIL (low grade squamous intraepithelial lesion) on Pap smear 08/21/2011  . Migraines   . Arthritis   . Bronchitis   . IBS (irritable bowel syndrome)   . Peripheral neuropathic pain     in hands   Past Surgical History  Procedure Laterality Date  . Tubal ligation  1993  . Dilation and curretage      after a miscarriage  . Endometrial ablation    . Laparoscopy  03/19/2011    Procedure: LAPAROSCOPY OPERATIVE;  Surgeon: Juliene Pina C. Hulan Fray, MD;  Location: [facility];  Service: Gynecology;  Laterality: N/A;  . Novasure ablation  03/19/2011    Procedure: NOVASURE ABLATION;  Surgeon: Juliene Pina C. Hulan Fray, MD;  Location: [facility];  Service: Gynecology;  Laterality: N/A;  . Knee arthroscopy Left 12-01-13   Family History  Problem Relation Age of Onset  . Diabetes  Mother   . Hypertension Mother   . Hyperlipidemia Mother   . Fibromyalgia Mother   . Sleep apnea Mother   . Diabetes Maternal Aunt   . Cancer Maternal Aunt     breast, metastatic cancer throughout body  . Breast cancer Maternal Aunt   . Crohn's disease Maternal Aunt   . Cancer Maternal Uncle     leukemia  . Diabetes Maternal Grandmother   . Cancer Maternal Uncle     colon  . Heart attack Father    History  Substance Use Topics  . Smoking status: Former Smoker -- 2.00 packs/day for 10 years    Types: Cigarettes    Quit date: 05/03/2009  . Smokeless tobacco: Never Used  . Alcohol Use: No     Comment: quit in 2008   OB History   Grav Para Term Preterm Abortions TAB SAB Ect Mult Living   4 3 3  1  1   3      Review of Systems  Constitutional: Negative for fever, chills, diaphoresis and fatigue.  HENT: Positive for sore throat and trouble swallowing. Negative for congestion, ear pain, rhinorrhea and sinus pressure.   Eyes: Negative for redness.  Respiratory: Positive for cough (mild). Negative for wheezing.   Gastrointestinal: Negative for nausea, vomiting, abdominal pain and diarrhea.  Genitourinary: Negative for dysuria.  Musculoskeletal: Negative for myalgias and neck stiffness.  Skin: Negative for rash.  Neurological: Negative for headaches.  Hematological: Negative for adenopathy.      Allergies  Imitrex  Home Medications   Prior to Admission medications   Medication Sig Start Date End Date Taking? Authorizing Provider  albuterol (PROVENTIL HFA;VENTOLIN HFA) 108 (90 BASE) MCG/ACT inhaler Inhale 1-2 puffs into the lungs every 6 (six) hours as needed for wheezing or shortness of breath.    Historical Provider, MD  ALPRAZolam Duanne Moron) 0.5 MG tablet Take 0.5 mg by mouth at bedtime.     Historical Provider, MD  amitriptyline (ELAVIL) 50 MG tablet Take 50 mg by mouth at bedtime.    Historical Provider, MD  atenolol (TENORMIN) 25 MG tablet Take 25 mg by mouth daily.     Historical Provider, MD  cyclobenzaprine (FLEXERIL) 10 MG tablet Take 1 tablet (10 mg total) by mouth 3 (three) times daily as needed for muscle spasms. 02/15/14   Janice Norrie, MD  dicyclomine (BENTYL) 10 MG capsule Take 10 mg by mouth 2 (two) times daily.    Historical Provider, MD  HYDROcodone-acetaminophen (NORCO) 10-325 MG per tablet Take 1 tablet by mouth 3 (three) times daily.    Historical Provider, MD  naproxen (NAPROSYN) 500 MG tablet Take 500 mg by mouth 2 (two) times daily with a meal.    Historical Provider, MD  omeprazole (PRILOSEC) 20 MG capsule Take 20 mg by mouth daily.    Historical Provider, MD   Triage Vitals: BP 121/76  Pulse 120  Temp(Src) 99.2 F (37.3 C) (Oral)  Resp 20  Ht 5\' 3"  (1.6 m)  Wt 240 lb (108.863 kg)  BMI 42.52 kg/m2  SpO2 98%  LMP 01/29/2014  Physical Exam  Nursing note and vitals reviewed. Constitutional: She is oriented to person, place, and time. She appears well-developed and well-nourished. No distress.  HENT:  Head: Normocephalic and atraumatic.  Right Ear: Tympanic membrane, external ear and ear canal normal.  Left Ear: Tympanic membrane, external ear and ear canal normal.  Nose: Nose normal. No mucosal edema or rhinorrhea.  Mouth/Throat: Uvula is midline and mucous membranes are normal. Mucous membranes are not dry. No oral lesions. No trismus in the jaw. No uvula swelling. Oropharyngeal exudate, posterior oropharyngeal edema and posterior oropharyngeal erythema present. No tonsillar abscesses.  Eyes: Conjunctivae and EOM are normal. Right eye exhibits no discharge. Left eye exhibits no discharge.  Neck: Normal range of motion. Neck supple. No tracheal deviation present.  Cardiovascular: Regular rhythm and normal heart sounds.  Tachycardia present.   HR 120  Pulmonary/Chest: Effort normal and breath sounds normal. No respiratory distress. She has no wheezes. She has no rales.  Abdominal: Soft. There is no tenderness. There is no rebound and no  guarding.  Musculoskeletal: Normal range of motion.  Lymphadenopathy:    She has no cervical adenopathy.  Neurological: She is alert and oriented to person, place, and time.  Skin: Skin is warm and dry.  Psychiatric: She has a normal mood and affect. Her behavior is normal.    ED Course  Procedures (including critical care time)  DIAGNOSTIC STUDIES: Oxygen Saturation is 98% on RA, normal by my interpretation.    COORDINATION OF CARE: 11:31 AM- Will order CBG monitoring and rapid strep screen.  Pt advised of plan for treatment and pt agrees.   Labs Review Labs Reviewed  RAPID STREP SCREEN - Abnormal; Notable for the following:    Streptococcus, Group A Screen (Direct) POSITIVE (*)    All other components within normal limits  CBG  MONITORING, ED    Imaging Review No results found.   EKG Interpretation None      Vital signs reviewed and are as follows: Filed Vitals:   03/06/14 1148  BP: 128/81  Pulse: 120  Temp: 101.3 F (38.5 C)  Resp: 16   Patient informed of results. IM Decadron given due to painful swallowing. Patient is able to swallow liquids.  Patient has developed a fever while in ED. Fever plus not taking her atenolol today is likely causing her tachycardia. Patient does not appear to be toxic or have severe sepsis. Do not feel fluids indicated.   Patient urged to return with worsening symptoms or other concerns. Patient verbalized understanding and agrees with plan.     MDM   Final diagnoses:  Strep throat   Pt with fever, + strep. Treated with Bicillin and Decadron. Patient appears nontoxic. Feel she is safe for discharge to home with NSAIDs. No concern for abscess or deep space neck infection at this time.  I personally performed the services described in this documentation, which was scribed in my presence. The recorded information has been reviewed and is accurate.       Carlisle Cater, PA-C 03/06/14 1219

## 2014-03-06 NOTE — ED Provider Notes (Signed)
Medical screening examination/treatment/procedure(s) were performed by non-physician practitioner and as supervising physician I was immediately available for consultation/collaboration.  Richarda Blade, MD 03/06/14 [phone]

## 2014-03-06 NOTE — ED Notes (Signed)
Pt reports throat was sore Sunday morning and today Pt is having difficulty eating and drinking due to pain.

## 2014-03-06 NOTE — ED Notes (Signed)
PA with pt.

## 2014-03-06 NOTE — Discharge Instructions (Signed)
Please read and follow all provided instructions.  Your diagnoses today include:  1. Strep throat     Tests performed today include:  Strep test: was POSITIVE for strep throat  Vital signs. See below for your results today.   Medications prescribed:  You were given a one-time shot of penicillin to treat your strep throat as well as decadron for pain and swelling.  Take any medications prescribed only as directed.   Home care instructions:  Please read the educational materials provided and follow any instructions contained in this packet.  Follow-up instructions: Please follow-up with your primary care provider as needed for further evaluation of your symptoms.  Return instructions:   Please return to the Emergency Department if you experience worsening symptoms.   Return if you have worsening problems swallowing, your neck becomes swollen, you cannot swallow your saliva or your voice becomes muffled.   Return with high persistent fever, persistent vomiting, or if you have trouble breathing.   Please return if you have any other emergent concerns.  Additional Information:  Your vital signs today were: BP 128/81   Pulse 120   Temp(Src) 101.3 F (38.5 C) (Oral)   Resp 16   Ht 5\' 3"  (1.6 m)   Wt 240 lb (108.863 kg)   BMI 42.52 kg/m2   SpO2 96%   LMP 01/29/2014 If your blood pressure (BP) was elevated above 135/85 this visit, please have this repeated by your doctor within one month. --------------

## 2014-03-13 ENCOUNTER — Ambulatory Visit: Payer: BC Managed Care – PPO | Attending: Internal Medicine

## 2014-03-31 NOTE — Telephone Encounter (Signed)
Message passed

## 2014-04-12 ENCOUNTER — Encounter (INDEPENDENT_AMBULATORY_CARE_PROVIDER_SITE_OTHER): Payer: Self-pay

## 2014-04-12 ENCOUNTER — Encounter: Payer: Self-pay | Admitting: Nurse Practitioner

## 2014-04-12 ENCOUNTER — Ambulatory Visit (INDEPENDENT_AMBULATORY_CARE_PROVIDER_SITE_OTHER): Payer: Self-pay | Admitting: Nurse Practitioner

## 2014-04-12 VITALS — BP 131/76 | HR 95 | Ht 63.0 in | Wt 241.0 lb

## 2014-04-12 DIAGNOSIS — R209 Unspecified disturbances of skin sensation: Secondary | ICD-10-CM

## 2014-04-12 DIAGNOSIS — M5442 Lumbago with sciatica, left side: Secondary | ICD-10-CM

## 2014-04-12 DIAGNOSIS — R52 Pain, unspecified: Secondary | ICD-10-CM

## 2014-04-12 DIAGNOSIS — G5600 Carpal tunnel syndrome, unspecified upper limb: Secondary | ICD-10-CM

## 2014-04-12 DIAGNOSIS — R29898 Other symptoms and signs involving the musculoskeletal system: Secondary | ICD-10-CM

## 2014-04-12 DIAGNOSIS — R200 Anesthesia of skin: Secondary | ICD-10-CM

## 2014-04-12 DIAGNOSIS — M6281 Muscle weakness (generalized): Secondary | ICD-10-CM

## 2014-04-12 DIAGNOSIS — M5430 Sciatica, unspecified side: Secondary | ICD-10-CM

## 2014-04-12 DIAGNOSIS — G5603 Carpal tunnel syndrome, bilateral upper limbs: Secondary | ICD-10-CM

## 2014-04-12 DIAGNOSIS — M5441 Lumbago with sciatica, right side: Secondary | ICD-10-CM

## 2014-04-12 NOTE — Patient Instructions (Addendum)
PLAN:  -  Start Gluten-free diet  - Continue Amitriptyline for headache prevention, pain -  Recommend daily B12 or B-Complex oral supplement  -  Continue Effexor for diffuse pain and depression  -  Continue Gabapentin for neuropathic pain. -  Follow up in 6 months, sooner as needed.  Gluten-Free Diet for Celiac Disease Gluten is a protein found in wheat, rye, barley, and triticale (a cross between wheat and rye) grains. People with celiac disease need to have a gluten-free diet. With celiac disease, gluten interferes with the absorption of food and may also cause intestinal injury.  Strict compliance is important even during symptom-free periods. This means eliminating all foods with gluten from your diet permanently. This requires some significant changes but is very manageable. WHAT DO I NEED TO KNOW ABOUT A GLUTEN-FREE DIET?  Look for items labeled with "GF." Looking for GF will make it easier to identify products that are safe to eat.  Read all labels. Gluten may have been added as a minor ingredient where least expected, such as in shredded cheeses or ice creams. Always check food labels and investigate questionable ingredients. Talk to your dietitian or health care provider if you have questions about certain foods or need help finding GF foods.  Check when in doubt. If you are not sure whether an ingredient contains gluten, check with the manufacturer. Note that some manufacturers may change ingredients without notice. Always read labels.   Know how food is prepared. Since flour and cereal products are often used in the preparation of foods, it is important to be aware of the methods of preparation used, as well as the ingredients in the foods themselves. This is especially true when you are dining out. Ask restaurants if they have a gluten-free menu.  Watch for cross-contamination. Cross-contamination occurs when gluten-free foods come into contact with foods that contain gluten. It  often happens during the manufacturing process. Always check the ingredient list and for warnings on packages, such as "may contain gluten."  Eat a balanced diet. It is important to still get enough fiber, iron, and B vitamins in your diet. Look for enriched whole grain gluten-free products and continue to eat a well-balanced diet of the important non-grain items, such as vegetables, fruit, lean proteins, legumes, and dairy.  Consider taking a gluten-free multivitamin and mineral supplement. Discuss this with your health care provider. WHAT KEY WORDS HELP IDENTIFY GLUTEN? Know key words to help identify gluten. A dietitian can help you identify possible harmful ingredients in the foods you normally eat. Words to check for on food labels include:   Flour, enriched flour, bromated flour, white flour, durum flour, graham flour, phosphated flour, self-rising flour, semolina, or farina.  Starch, dextrin, modified food starch, or cereal.  Thickening, fillers, or emulsifiers.  Any kind of malt flavoring, extract, or syrup (malt is made from barley and includes malt vinegar, malted milk, and malted beverages).  Hydrolyzed vegetable protein. WHAT FOODS CAN I EAT? Below is a list of common foods that are allowed with a gluten-free diet.  Grains Products made from the following flours or grains:amaranth,bean flours, 100% buckwheat flour, corn, millet, nut flours or meals, GF oats, quinoa, rice, sorghum, teff, any all-purpose 100% GF flour mix, rice wafers, pure cornmeal tortillas, popcorn, some crackers, some chips, and hot cereals made from cornmeal. Ask your dietitian which specific hot and cold cereals are allowed. Hominy, rice or wild rice, and special GF pasta. Some Asian rice noodles or bean noodles. Arrowroot  starch, corn bran, corn flour, corn germ, cornmeal, corn starch, potato flour, potato starch flour, and rice bran. Rice flours: plain, brown, and sweet. Rice polish, soy flour, tapioca  starch. Vegetables All plain, fresh, frozen, or canned vegetables.  Fruits All fresh, frozen, canned, dried fruits, and fruit juices.  Meats and Other Protein Foods Meat, fish, poultry, or eggs prepared without added wheat, rye, barley, or triticale. Some luncheon meat and some frankfurters. Pure meat. All aged cheese, most processed cheese products, some cottage cheese, and some cream cheese. Dried beans, dried peas, and lentils.  Dairy Milk and yogurt made with allowed ingredients.  Beverages Coffee (regular or decaffeinated), tea, herbal tea (read label to be sure that no wheat flour has been added). Carbonated beverages and some root beers. Wine, sake, and distilled spirits, such as gin, vodka, and whiskey. GF beers and GF ciders.  Sweetsand Desserts Sugar, honey, some syrups, molasses, jelly, jam, plain hard candy, marshmallows, gumdrops, homemade candies free of wheat, rye, barley, or triticale. Coconut. Custard, some pudding mixes, and homemade puddings from cornstarch, rice, and tapioca. Gelatin desserts, sorbets, frozen ice pops, and sherbet. Cake, cookies, and other desserts prepared with allowed flours. Some commercial ice creams. Ask your dietitian about specific brands of dessert that are allowed.  Fats and Oils Butter, margarine, vegetable oil, sour cream not containing modified food starch, whipping cream, shortening, lard, cream, and some mayonnaise. Some commercial salad dressings. Peanut butter.  Other Homemade broth and soups made with allowed ingredients; some canned or frozen soups. Any other combination or prepared foods that do not contain gluten. Monosodium glutamate (MSG). Cider, rice, and wine vinegar. Baking soda and baking powder. Certain soy sauces (Tamari). Ask your dietitian about specific brands that are allowed. Nuts, coconut, chocolate, and pure cocoa powder. Salt, pepper, herbs, spices, extracts, and food colorings. The items listed above may not be a  complete list of allowed foods or beverages. Contact your dietitian for more options.  WHAT FOODS CAN I NOT EAT? Below is a list of common foods that are not allowed with a gluten-free diet.  Grains Barley, bran, bulgur, cracked wheat, graham, malt, matzo, wheat germ, and all wheat and rye cereals including spelt and kamut. Avoid cereals containing malt as a flavoring, such as rice cereal. Also avoid regular noodles, spaghetti, macaroni, and most packaged rice mixes, and all others containing wheat, rye, barley, or triticale.  Vegetables Most creamed vegetables, most vegetables canned in sauces, and any vegetables prepared with wheat, rye, barley, or triticale.  Fruits Thickened or prepared fruits and some pie fillings.  Meats and Other Protein Sources Any meat or meat alternative containing wheat, rye, barley, or gluten stabilizers (such as some hot dogs, salami, cold cuts, or sausage). Bread-containing products, such as Swiss steak, croquettes, and meatloaf. Most tuna canned in vegetable broth, [place] with hydrolyzed vegetable protein (HVP) injected as part of the basting, and any cheese product containing oat gum as an ingredient. Seitan. Imitation fish. Dairy Commercial chocolate milk, which may have cereal added, and malted milk. Beverages Certain cereal beverages. Beer and ciders (unless GF), ale, malted milk, and some root beers. Sweetsand Desserts Commercial candies containing wheat, rye, barley, or triticale. Certain toffees are dusted with wheat flour. Chocolate-coated nuts, which are often rolled in flour. Cakes, cookies, doughnuts, and pastries that are prepared with wheat, barley, rye, or triticale flour. Some commercial ice creams, ice cream flavors which contain cookies, crumbs, or cheesecake. Ice cream cones. Commercially prepared mixes for cakes, cookies, and  other desserts unless marked GF. Bread pudding and other puddings thickened with flour. Fats and Oils Some commercial  salad dressings and sour cream containing modified food starch.  Condiments Some curry powder, some dry seasoning mixes, some gravy extracts, some meat sauces, some ketchup, some prepared mustard, horseradish. Other All soups containing wheat, rye, barley, or triticale flour. Bouillon and bouillon cubes that contain HVP. Combination or prepared foods that contain gluten. Some soy sauce, some chip dips, and some chewing gum. Yeast extract (contains barley). Caramel color (may contain malt). The items listed above may not be a complete list of foods and beverages to avoid. Contact your dietitian for more information. Document Released: 07/21/2005 Document Revised: 12/05/2013 Document Reviewed: 05/25/2013 [facility] Patient Information 2015 [place], [place]. This information is not intended to replace advice given to you by your health care provider. Make sure you discuss any questions you have with your health care provider.

## 2014-04-12 NOTE — Progress Notes (Signed)
PATIENT: Gloria Hall DOB: 05-21-1971  REASON FOR VISIT: routine follow up HISTORY FROM: patient  HISTORY OF PRESENT ILLNESS: 43 year old right-handed female here for evaluation of low back pain, left hip pain, left leg pain and bilateral foot numbness.   05/27/13 (VP): Symptoms have been going on for past one to 2 years. She has pain in her left sacroiliac joint region, left hip, radiating to her left leg and foot. She's been evaluated by orthopedic surgery, had MRI of the lumbar spine which been unremarkable. Patient is currently on hydrocodone, meloxicam, ibuprofen, amitriptyline per her PCP for pain control. She also has diffuse pain all over her body, possible fibromyalgia, and frequent migraine headaches. Patient denies any prior injuries. In 1993 when she was pregnant, she was told that her BP was laying on her left sided may have caused some nerve damage in her left leg. No pain in her right leg. No significant pain in her arms or neck.  Patient has had prior diagnosis of carpal tunnel syndrome and left hand, previously on gabapentin.  Patient is comfortable with her current medication regimen. She has tried some physical therapy exercises previously when she had a lumbar strain injury. She did not get much relief previously.   UPDATE 11/25/13 (LL): Ms. Farnell returns for follow up after consult with Dr. Merceda Elks 6 months ago. Her neuropathy panel showed low vitamin D and borderline B12. She has started to take a Vitamin D supplement. She continues to have diffuse pain, with numbness in feet and weakness in hands. She takes Amitriptyline 50 mg each night to help her sleep and reduce headaches, but still has frequent headaches. She feels her carpel tunnel is getting worse because she is dropping things out of her hands.   No Show on 01/09/14.  UPDATE 04/12/14 (LL): Since last visit, headaches are better on Amitriptyline, none in over 1 month.  She had crying spells on Cymbalta so she  discontinued it, was started on Effexor and Gabapentin and mood and anxiety is better. 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 She has applied for [employer] disability and has been denied twice.  All previous symptoms are unchanged.  She had arthroscopic surgery in left knee in April, still with pain, reports will need knee replacement in the future.  REVIEW OF SYSTEMS: Full 14 system review of systems performed and notable only for anxiety, agitation, depression, headache numbness, weakness, dizziness, insomnia, restless legs, daytime sleepiness, increased thirst, joint pain, joint swelling, aching muscles, muscle cramps, walking difficulty, itching, palpitations, appetite change, fatigue, excessive sweating, shortness of breath, eye itching, light sensitivity, double vision, loss of vision, heat intolerance, excessive thirst, flushing, ringing in ears, abdominal pain, rectal bleeding, black stools, constipation, diarrhea, irritable bowel.   ALLERGIES: Allergies  Allergen Reactions  . Imitrex [Sumatriptan] Shortness Of Breath    HOME MEDICATIONS: Outpatient Prescriptions Prior to Visit  Medication Sig Dispense Refill  . albuterol (PROVENTIL HFA;VENTOLIN HFA) 108 (90 BASE) MCG/ACT inhaler Inhale 1-2 puffs into the lungs every 6 (six) hours as needed for wheezing or shortness of breath.      Marland Kitchen amitriptyline (ELAVIL) 50 MG tablet Take 50 mg by mouth at bedtime.      Marland Kitchen atenolol (TENORMIN) 25 MG tablet Take 25 mg by mouth daily.      Marland Kitchen dicyclomine (BENTYL) 10 MG capsule Take 10 mg by mouth 2 (two)  times daily.      . naproxen (NAPROSYN) 500 MG tablet Take 500 mg by mouth 2 (two) times daily with a meal.      . omeprazole (PRILOSEC) 20 MG capsule Take 20 mg by mouth daily.      . cyclobenzaprine (FLEXERIL) 10 MG tablet Take 1 tablet (10 mg total) by mouth 3 (three) times daily as needed for muscle spasms.  30  tablet  0   Medications  . oxyCODONE-acetaminophen (PERCOCET/ROXICET) 5-325 MG per tablet    Sig: Take by mouth every 8 (eight) hours as needed for severe pain.  Marland Kitchen gabapentin (NEURONTIN) 300 MG capsule    Sig: Take 300 mg by mouth 2 (two) times daily.  Marland Kitchen venlafaxine (EFFEXOR) 75 MG tablet    Sig: Take 75 mg by mouth 2 (two) times daily.    PHYSICAL EXAM Filed Vitals:   04/12/14 0928  BP: 131/76  Pulse: 95  Height: 5\' 3"  (1.6 m)  Weight: 241 lb (109.317 kg)   Body mass index is 42.7 kg/(m^2).  Generalized: Well developed, moderately obese, in no acute distress  Head: normocephalic and atraumatic. Oropharynx benign  Neck: Supple, no carotid bruits  Cardiac: Regular rate rhythm, no murmur  Musculoskeletal: STRAIGHT LEG RAISE POSITIVE IN LEFT > RIGHT LEGS. FABER TESTING POSITIVE BILATERALLY.   Neurological examination  Mentation: Alert oriented to time, place, history taking. Follows all commands speech and language fluent, depressed affect. Cranial nerve II-XII: Fundoscopic exam not done. Pupils were equal round reactive to light extraocular movements were full, visual field were full on confrontational test. Facial sensation and strength were normal. hearing was intact to finger rubbing bilaterally. Uvula tongue midline. head turning and shoulder shrug and were normal and symmetric.Tongue protrusion into cheek strength was normal. Motor: The motor testing reveals 5 over 5 strength of all 4 extremities. Good symmetric motor tone is noted throughout.  Sensory: Sensory testing is intact to soft touch on all 4 extremities. No evidence of extinction is noted.  Coordination: Cerebellar testing reveals good finger-nose-finger and heel-to-shin bilaterally.  Gait and station: Gait is slow and antalgic. Tandem gait is unsteady. Romberg is negative. DIFF WITH HEEL WALKING.  Reflexes: Deep tendon reflexes are symmetric and normal bilaterally. TRACE IN ANKLES  DIAGNOSTIC DATA (LABS, IMAGING,  TESTING) - I reviewed patient records, labs, notes, testing and imaging myself where available.  Lab Results  Component Value Date   WBC 8.2 02/04/2014   HGB 11.9* 02/04/2014   HCT 35.0* 02/04/2014   MCV 81.9 02/04/2014   PLT 301 02/04/2014      Component Value Date/Time   NA 137 02/04/2014 1433   K 3.6* 02/04/2014 1433   CL 105 02/04/2014 1433   CO2 26 10/05/2012 1130   GLUCOSE 112* 02/04/2014 1433   BUN 10 02/04/2014 1433   CREATININE 0.70 02/04/2014 1433   CALCIUM 9.3 10/05/2012 1130   PROT 6.7 05/27/2013 1006   PROT 7.3 10/05/2012 1130   ALBUMIN 3.4* 10/05/2012 1130   AST 15 10/05/2012 1130   ALT 10 10/05/2012 1130   ALKPHOS 70 10/05/2012 1130   BILITOT 0.5 10/05/2012 1130   GFRNONAA >90 10/05/2012 1130   GFRAA >90 10/05/2012 1130   Lab Results  Component Value Date   CHOL 181 02/20/2014   HDL 56 02/20/2014   LDLCALC 99 02/20/2014   TRIG 129 02/20/2014   CHOLHDL 3.2 02/20/2014   Lab Results  Component Value Date   HGBA1C 5.6 02/17/2014   Lab Results  Component  Value Date   VITAMINB12 267 05/27/2013   Lab Results  Component Value Date   TSH 4.508* 02/20/2014   Lab Results  Component Value Date   ESRSEDRATE 28 05/27/2013    ASSESSMENT: 43 y.o. year old female here with low back pain, left hip pain, left leg pain, bilateral foot numbness. Neurologic exam notable only for decreased sensation in the feet to pinprick, with trace ankle jerk reflexes. Patient may have underlying peripheral neuropathy. Patient has decreased range of motion and straight leg raise testing with some reproduction of pain. She may have some muscle skeletal cause of pain as well. No evidence of lumbar radiculopathy on MRI of the lumbar spine. Other aggravating factors include underlying depression, anxiety, possible fibromyalgia. Borderline low B12.   Ddx: neuropathy, osteoarthritis, fibromyalgia, pain syndrome   PLAN:  -  Start Gluten-free diet, (cannot afford Gluten sensitivity screening, $264 plus reflex cost) -  Recommend  daily B12 or B-Complex oral supplement  -  Continue Effexor for diffuse pain and depression  -  Continue Gabapentin for neuropathic pain. -  Follow up in 6 months, sooner as needed.  Tawny Asal Earnestine Shipp, MSN, FNP-BC, A/GNP-C 04/12/2014, 10:01 AM [facility] [address] [place], [place] [address] [phone]  Note: This document was prepared with digital dictation and possible smart phrase technology. Any transcriptional errors that result from this process are unintentional.

## 2014-04-14 ENCOUNTER — Encounter: Payer: Self-pay | Admitting: Internal Medicine

## 2014-04-14 ENCOUNTER — Ambulatory Visit: Payer: Self-pay | Attending: Internal Medicine | Admitting: Internal Medicine

## 2014-04-14 VITALS — BP 119/77 | HR 84 | Temp 98.8°F | Resp 16

## 2014-04-14 DIAGNOSIS — K219 Gastro-esophageal reflux disease without esophagitis: Secondary | ICD-10-CM | POA: Insufficient documentation

## 2014-04-14 DIAGNOSIS — F1410 Cocaine abuse, uncomplicated: Secondary | ICD-10-CM | POA: Insufficient documentation

## 2014-04-14 DIAGNOSIS — R52 Pain, unspecified: Secondary | ICD-10-CM | POA: Insufficient documentation

## 2014-04-14 DIAGNOSIS — R220 Localized swelling, mass and lump, head: Secondary | ICD-10-CM

## 2014-04-14 DIAGNOSIS — M25569 Pain in unspecified knee: Secondary | ICD-10-CM | POA: Insufficient documentation

## 2014-04-14 DIAGNOSIS — R5381 Other malaise: Secondary | ICD-10-CM | POA: Insufficient documentation

## 2014-04-14 DIAGNOSIS — R5383 Other fatigue: Secondary | ICD-10-CM

## 2014-04-14 DIAGNOSIS — M545 Low back pain, unspecified: Secondary | ICD-10-CM | POA: Insufficient documentation

## 2014-04-14 DIAGNOSIS — G609 Hereditary and idiopathic neuropathy, unspecified: Secondary | ICD-10-CM | POA: Insufficient documentation

## 2014-04-14 DIAGNOSIS — R946 Abnormal results of thyroid function studies: Secondary | ICD-10-CM

## 2014-04-14 DIAGNOSIS — Z87891 Personal history of nicotine dependence: Secondary | ICD-10-CM | POA: Insufficient documentation

## 2014-04-14 DIAGNOSIS — K5900 Constipation, unspecified: Secondary | ICD-10-CM | POA: Insufficient documentation

## 2014-04-14 DIAGNOSIS — R635 Abnormal weight gain: Secondary | ICD-10-CM | POA: Insufficient documentation

## 2014-04-14 DIAGNOSIS — L909 Atrophic disorder of skin, unspecified: Secondary | ICD-10-CM | POA: Insufficient documentation

## 2014-04-14 DIAGNOSIS — D1779 Benign lipomatous neoplasm of other sites: Secondary | ICD-10-CM | POA: Insufficient documentation

## 2014-04-14 DIAGNOSIS — L919 Hypertrophic disorder of the skin, unspecified: Secondary | ICD-10-CM

## 2014-04-14 DIAGNOSIS — K589 Irritable bowel syndrome without diarrhea: Secondary | ICD-10-CM | POA: Insufficient documentation

## 2014-04-14 DIAGNOSIS — R7989 Other specified abnormal findings of blood chemistry: Secondary | ICD-10-CM

## 2014-04-14 DIAGNOSIS — F411 Generalized anxiety disorder: Secondary | ICD-10-CM | POA: Insufficient documentation

## 2014-04-14 DIAGNOSIS — Z79899 Other long term (current) drug therapy: Secondary | ICD-10-CM | POA: Insufficient documentation

## 2014-04-14 DIAGNOSIS — R221 Localized swelling, mass and lump, neck: Secondary | ICD-10-CM

## 2014-04-14 DIAGNOSIS — Z791 Long term (current) use of non-steroidal anti-inflammatories (NSAID): Secondary | ICD-10-CM | POA: Insufficient documentation

## 2014-04-14 MED ORDER — OMEPRAZOLE 20 MG PO CPDR: 20.0000 mg | DELAYED_RELEASE_CAPSULE | Freq: Every day | ORAL | Status: DC

## 2014-04-14 MED ORDER — CYCLOBENZAPRINE HCL 10 MG PO TABS: 10.0000 mg | ORAL_TABLET | Freq: Three times a day (TID) | ORAL | Status: DC | PRN

## 2014-04-14 NOTE — Patient Instructions (Signed)
Hypothyroidism The thyroid is a large gland located in the lower front of your neck. The thyroid gland helps control metabolism. Metabolism is how your body handles food. It controls metabolism with the hormone thyroxine. When this gland is underactive (hypothyroid), it produces too little hormone.  CAUSES These include:   Absence or destruction of thyroid tissue.  Goiter due to iodine deficiency.  Goiter due to medications.  Congenital defects (since birth).  Problems with the pituitary. This causes a lack of TSH (thyroid stimulating hormone). This hormone tells the thyroid to turn out more hormone. SYMPTOMS  Lethargy (feeling as though you have no energy)  Cold intolerance  Weight gain (in spite of normal food intake)  Dry skin  Coarse hair  Menstrual irregularity (if severe, may lead to infertility)  Slowing of thought processes Cardiac problems are also caused by insufficient amounts of thyroid hormone. Hypothyroidism in the newborn is cretinism, and is an extreme form. It is important that this form be treated adequately and immediately or it will lead rapidly to retarded physical and mental development. DIAGNOSIS  To prove hypothyroidism, your caregiver may do blood tests and ultrasound tests. Sometimes the signs are hidden. It may be necessary for your caregiver to watch this illness with blood tests either before or after diagnosis and treatment. TREATMENT  Low levels of thyroid hormone are increased by using synthetic thyroid hormone. This is a safe, effective treatment. It usually takes about four weeks to gain the full effects of the medication. After you have the full effect of the medication, it will generally take another four weeks for problems to leave. Your caregiver may start you on low doses. If you have had heart problems the dose may be gradually increased. It is generally not an emergency to get rapidly to normal. HOME CARE INSTRUCTIONS   Take your  medications as your caregiver suggests. Let your caregiver know of any medications you are taking or start taking. Your caregiver will help you with dosage schedules.  As your condition improves, your dosage needs may increase. It will be necessary to have continuing blood tests as suggested by your caregiver.  Report all suspected medication side effects to your caregiver. SEEK MEDICAL CARE IF: Seek medical care if you develop:  Sweating.  Tremulousness (tremors).  Anxiety.  Rapid weight loss.  Heat intolerance.  Emotional swings.  Diarrhea.  Weakness. SEEK IMMEDIATE MEDICAL CARE IF:  You develop chest pain, an irregular heart beat (palpitations), or a rapid heart beat. MAKE SURE YOU:   Understand these instructions.  Will watch your condition.  Will get help right away if you are not doing well or get worse. Document Released: 07/21/2005 Document Revised: 10/13/2011 Document Reviewed: 03/10/2008 ExitCare Patient Information 2015 [employer]. This information is not intended to replace advice given to you by your health care provider. Make sure you discuss any questions you have with your health care provider.  

## 2014-04-14 NOTE — Progress Notes (Signed)
Pt is here following up on her HTN, migraines and chronic pain in her lower back.

## 2014-04-14 NOTE — Progress Notes (Signed)
Patient ID: Gloria Hall, female   DOB: Jun 13, 1971, 43 y.o.   MRN: [id number]  CC: neck mass, weight gain  HPI:  Patient presents today for a follow up of neck pain and lab work from last visit. She states that back in 2008 she was on thyroid medication for two months and was never given a refill.  She endorses weight gain, constipation, anxiety, fatigue, and weakness. She reports that the mass in her neck is now beginning to affect her swallowing.  She reports that she often feels like she is choking because the mass is so large.   She reports skin tags on face that have grown slightly and she would like them removed. They are not painful or bleeding.    Bilateral knee pain was once treated with vicodin and percocets.  She has had one arthroscopic surgery on the left knee and is continuing to have pain.  She reports that she was told that she may need a knee replacement of the left knee but she lost her insurance shortly after this.    Allergies  Allergen Reactions  . Imitrex [Sumatriptan] Shortness Of Breath   Past Medical History  Diagnosis Date  . Anemia   . Anxiety   . LSIL (low grade squamous intraepithelial lesion) on Pap smear 08/21/2011  . Migraines   . Arthritis   . Bronchitis   . IBS (irritable bowel syndrome)   . Peripheral neuropathic pain     in hands   Current Outpatient Prescriptions on File Prior to Visit  Medication Sig Dispense Refill  . albuterol (PROVENTIL HFA;VENTOLIN HFA) 108 (90 BASE) MCG/ACT inhaler Inhale 1-2 puffs into the lungs every 6 (six) hours as needed for wheezing or shortness of breath.      Marland Kitchen amitriptyline (ELAVIL) 50 MG tablet Take 50 mg by mouth at bedtime.      Marland Kitchen atenolol (TENORMIN) 25 MG tablet Take 25 mg by mouth daily.      Marland Kitchen dicyclomine (BENTYL) 10 MG capsule Take 10 mg by mouth 2 (two) times daily.      Marland Kitchen gabapentin (NEURONTIN) 300 MG capsule Take 300 mg by mouth 2 (two) times daily.      . naproxen (NAPROSYN) 500 MG tablet Take 500 mg by  mouth 2 (two) times daily with a meal.      . omeprazole (PRILOSEC) 20 MG capsule Take 20 mg by mouth daily.      Marland Kitchen oxyCODONE-acetaminophen (PERCOCET/ROXICET) 5-325 MG per tablet Take by mouth every 8 (eight) hours as needed for severe pain.      Marland Kitchen venlafaxine (EFFEXOR) 75 MG tablet Take 75 mg by mouth 2 (two) times daily.       No current facility-administered medications on file prior to visit.   Family History  Problem Relation Age of Onset  . Diabetes Mother   . Hypertension Mother   . Hyperlipidemia Mother   . Fibromyalgia Mother   . Sleep apnea Mother   . Diabetes Maternal Aunt   . Cancer Maternal Aunt     breast, metastatic cancer throughout body  . Breast cancer Maternal Aunt   . Crohn's disease Maternal Aunt   . Cancer Maternal Uncle     leukemia  . Diabetes Maternal Grandmother   . Cancer Maternal Uncle     colon  . Heart attack Father    History   Social History  . Marital Status: Married    Spouse Name: Gloria Hall    Number of Children:  3  . Years of Education: 12th   Occupational History  .      [employer]   Social History Main Topics  . Smoking status: Former Smoker -- 2.00 packs/day for 10 years    Types: Cigarettes    Quit date: 05/03/2009  . Smokeless tobacco: Never Used  . Alcohol Use: No     Comment: quit in 2008  . 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   Marland Kitchen Sexual Activity: Yes    Birth Control/ Protection: Surgical   Other Topics Concern  . Not on file   Social History Narrative   Patient lives at home with spouse.   Caffeine Use: 2 cups of coffee and 2 cups of tea daily    Review of Systems: See HPI   Objective:   Filed Vitals:   04/14/14 1111  BP: 162/88  Pulse: 79  Temp: 98.8 F (37.1 C)  Resp: 16    Physical Exam: Constitutional: Patient appears well-developed and well-nourished. No distress. HENT: Normocephalic, atraumatic, External right and left ear  normal. Oropharynx is clear and moist.  Eyes: Conjunctivae and EOM are normal. PERRLA, no scleral icterus. Neck: Normal ROM. Neck supple. . Large palpable mass under submantle region. CVS: RRR, S1/S2 +, no murmurs, no gallops, no carotid bruit.  Pulmonary: Effort and breath sounds normal, no stridor, rhonchi, wheezes, rales.  Abdominal: Soft. BS +,  no distension, tenderness, rebound or guarding.  Musculoskeletal: Normal range of motion. No edema and no tenderness.  Neuro: Alert. Normal reflexes, muscle tone coordination. No cranial nerve deficit. Skin: Skin is warm and dry. No rash noted. Not diaphoretic. No erythema. No pallor. Psychiatric: Normal mood and affect. Behavior, judgment, thought content normal.  Lab Results  Component Value Date   WBC 8.2 02/04/2014   HGB 11.9* 02/04/2014   HCT 35.0* 02/04/2014   MCV 81.9 02/04/2014   PLT 301 02/04/2014   Lab Results  Component Value Date   CREATININE 0.70 02/04/2014   BUN 10 02/04/2014   NA 137 02/04/2014   K 3.6* 02/04/2014   CL 105 02/04/2014   CO2 26 10/05/2012    Lab Results  Component Value Date   HGBA1C 5.6 02/17/2014   Lipid Panel     Component Value Date/Time   CHOL 181 02/20/2014 1021   TRIG 129 02/20/2014 1021   HDL 56 02/20/2014 1021   CHOLHDL 3.2 02/20/2014 1021   VLDL 26 02/20/2014 1021   LDLCALC 99 02/20/2014 1021       Assessment and plan:   Gloria Hall was seen today for follow-up.  Diagnoses and associated orders for this visit:  Neck mass - Ambulatory referral to General Surgery for removal of lipoma  Midline low back pain without sciatica - cyclobenzaprine (FLEXERIL) 10 MG tablet; Take 1 tablet (10 mg total) by mouth 3 (three) times daily as needed for muscle spasms.  Elevated TSH - TSH - T4, Free  Acid reflux - omeprazole (PRILOSEC) 20 MG capsule; Take 1 capsule (20 mg total) by mouth daily.    Return in about 3 months (around 07/14/2014) for Hypertension.       Chari Manning, NP-C [facility] [phone] 05/01/2014, 2:41 PM

## 2014-04-15 LAB — T4, FREE: Free T4: 0.83 ng/dL (ref 0.80–1.80)

## 2014-04-15 LAB — TSH: TSH: 3.068 u[IU]/mL (ref 0.350–4.500)

## 2014-04-20 ENCOUNTER — Telehealth: Payer: Self-pay | Admitting: Internal Medicine

## 2014-04-20 NOTE — Telephone Encounter (Signed)
Pt. Called to request blood work results. Please f/u with pt.

## 2014-04-25 NOTE — Telephone Encounter (Signed)
Pt aware of lab results 

## 2014-04-25 NOTE — Telephone Encounter (Signed)
Message copied by Betti Cruz on Tue Apr 25, 2014 12:43 PM ------      Message from: Chari Manning A      Created: Mon Apr 24, 2014 10:23 PM       Please explain to patient that her thyroid levels are normal now. That she she does NOT have thyroid disease. Her levels may have been elevated a couple of months ago due to a acute process or illness. ------

## 2014-04-26 ENCOUNTER — Telehealth: Payer: Self-pay | Admitting: Nurse Practitioner

## 2014-04-26 NOTE — Telephone Encounter (Signed)
Please call patient. She may return to work, may use wrist splints to decrease symptoms from carpel tunnel. Medicaid will not pay for these.

## 2014-04-26 NOTE — Telephone Encounter (Signed)
Please advise previous note. Thanks  °

## 2014-04-26 NOTE — Telephone Encounter (Signed)
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  Please call [phone] anytime and may leave detailed message.

## 2014-04-27 NOTE — Progress Notes (Signed)
I reviewed note and agree with plan.   Kessie Croston R. Jasdeep Kepner, MD  Certified in Neurology, Neurophysiology and Neuroimaging  [facility] [address], [address] [place], [place] [address] [phone]   

## 2014-04-27 NOTE — Telephone Encounter (Signed)
Called pt and left message per Jeani Hawking, NP that pt can return to work and may use wrist splints to decrease symptoms from carpel tunnel but medicaid will not pay for these and if she has any other problems, questions or concerns to call the office.

## 2014-05-26 ENCOUNTER — Telehealth: Payer: Self-pay | Admitting: *Deleted

## 2014-05-26 ENCOUNTER — Encounter: Payer: Self-pay | Admitting: Family Medicine

## 2014-05-26 ENCOUNTER — Encounter: Payer: Self-pay | Admitting: *Deleted

## 2014-05-26 NOTE — Telephone Encounter (Signed)
Gloria Hall missed an appointment for a colposcopy today. Called her and left a message we are calling regarding her missed appointment- and please call us to reschedule Will also send letter.

## 2014-06-01 ENCOUNTER — Encounter (HOSPITAL_COMMUNITY): Payer: Self-pay | Admitting: Emergency Medicine

## 2014-06-01 DIAGNOSIS — F419 Anxiety disorder, unspecified: Secondary | ICD-10-CM | POA: Insufficient documentation

## 2014-06-01 DIAGNOSIS — G8929 Other chronic pain: Secondary | ICD-10-CM | POA: Insufficient documentation

## 2014-06-01 DIAGNOSIS — Z791 Long term (current) use of non-steroidal anti-inflammatories (NSAID): Secondary | ICD-10-CM | POA: Insufficient documentation

## 2014-06-01 DIAGNOSIS — G43009 Migraine without aura, not intractable, without status migrainosus: Secondary | ICD-10-CM | POA: Insufficient documentation

## 2014-06-01 DIAGNOSIS — Z8739 Personal history of other diseases of the musculoskeletal system and connective tissue: Secondary | ICD-10-CM | POA: Insufficient documentation

## 2014-06-01 DIAGNOSIS — Z87891 Personal history of nicotine dependence: Secondary | ICD-10-CM | POA: Insufficient documentation

## 2014-06-01 DIAGNOSIS — M549 Dorsalgia, unspecified: Secondary | ICD-10-CM | POA: Insufficient documentation

## 2014-06-01 DIAGNOSIS — Z79899 Other long term (current) drug therapy: Secondary | ICD-10-CM | POA: Insufficient documentation

## 2014-06-01 DIAGNOSIS — I10 Essential (primary) hypertension: Secondary | ICD-10-CM | POA: Insufficient documentation

## 2014-06-01 NOTE — ED Notes (Signed)
Pt. reports worsening migraine headache onset last week with nausea , denies fever , no photophobia or blurred vision .

## 2014-06-02 ENCOUNTER — Encounter: Payer: Self-pay | Admitting: General Practice

## 2014-06-02 ENCOUNTER — Emergency Department (HOSPITAL_COMMUNITY)
Admission: EM | Admit: 2014-06-02 | Discharge: 2014-06-02 | Disposition: A | Discharge status: Home / Self Care | Payer: Self-pay | Attending: Emergency Medicine | Admitting: Emergency Medicine

## 2014-06-02 DIAGNOSIS — G43009 Migraine without aura, not intractable, without status migrainosus: Secondary | ICD-10-CM

## 2014-06-02 HISTORY — DX: Other chronic pain: G89.29

## 2014-06-02 HISTORY — DX: Dorsalgia, unspecified: M54.9

## 2014-06-02 MED ORDER — SODIUM CHLORIDE 0.9 % IV BOLUS (SEPSIS)
1000.0000 mL | Freq: Once | INTRAVENOUS | Status: AC
  Administered 2014-06-02: 1000 mL via INTRAVENOUS

## 2014-06-02 MED ORDER — KETOROLAC TROMETHAMINE 30 MG/ML IJ SOLN
30.0000 mg | Freq: Once | INTRAMUSCULAR | Status: AC
  Administered 2014-06-02: 30 mg via INTRAVENOUS
  Filled 2014-06-02: qty 1

## 2014-06-02 MED ORDER — PROCHLORPERAZINE EDISYLATE 5 MG/ML IJ SOLN
10.0000 mg | Freq: Four times a day (QID) | INTRAMUSCULAR | Status: DC | PRN
Start: 2014-06-02 — End: 2014-06-02
  Administered 2014-06-02: 10 mg via INTRAVENOUS
  Filled 2014-06-02: qty 2

## 2014-06-02 MED ORDER — DIPHENHYDRAMINE HCL 50 MG/ML IJ SOLN
25.0000 mg | Freq: Once | INTRAMUSCULAR | Status: AC
  Administered 2014-06-02: 25 mg via INTRAVENOUS
  Filled 2014-06-02: qty 1

## 2014-06-02 NOTE — ED Notes (Signed)
Pt A&OX4, ambulatory at d/c with steady gait, NAD 

## 2014-06-02 NOTE — ED Provider Notes (Signed)
CSN: [id number]     Arrival date & time 06/01/14  2241 History   First MD Initiated Contact with Patient 06/02/14 0101     Chief Complaint  Patient presents with  . Migraine     (Consider location/radiation/quality/duration/timing/severity/associated sxs/prior Treatment) HPI  This 43 year old female with a history of migraines who presents with headache. Patient reports one week of headache. She states that it is classic for her migraines. It is bitemporal and nonradiating. Currently her pain is 8/10. She has associated photophobia. She also reports nausea without vomiting. No fevers or neck pain. Patient took Tylenol at home without relief. Patient also reports chronic low back and knee pain.  Past Medical History  Diagnosis Date  . Anemia   . Anxiety   . LSIL (low grade squamous intraepithelial lesion) on Pap smear 08/21/2011  . Migraines   . Arthritis   . Bronchitis   . IBS (irritable bowel syndrome)   . Peripheral neuropathic pain     in hands  . Hypertension   . Back pain, chronic    Past Surgical History  Procedure Laterality Date  . Tubal ligation  1993  . Dilation and curretage      after a miscarriage  . Endometrial ablation    . Laparoscopy  03/19/2011    Procedure: LAPAROSCOPY OPERATIVE;  Surgeon: Juliene Pina C. Hulan Fray, MD;  Location: [facility];  Service: Gynecology;  Laterality: N/A;  . Novasure ablation  03/19/2011    Procedure: NOVASURE ABLATION;  Surgeon: Juliene Pina C. Hulan Fray, MD;  Location: [facility];  Service: Gynecology;  Laterality: N/A;  . Knee arthroscopy Left 12-01-13   Family History  Problem Relation Age of Onset  . Diabetes Mother   . Hypertension Mother   . Hyperlipidemia Mother   . Fibromyalgia Mother   . Sleep apnea Mother   . Diabetes Maternal Aunt   . Cancer Maternal Aunt     breast, metastatic cancer throughout body  . Breast cancer Maternal Aunt   . Crohn's disease Maternal Aunt   . Cancer Maternal Uncle     leukemia  . Diabetes Maternal Grandmother   .  Cancer Maternal Uncle     colon  . Heart attack Father    History  Substance Use Topics  . Smoking status: Former Smoker -- 2.00 packs/day for 10 years    Types: Cigarettes    Quit date: 05/03/2009  . Smokeless tobacco: Never Used  . Alcohol Use: No     Comment: quit in 2008   OB History   Grav Para Term Preterm Abortions TAB SAB Ect Mult Living   4 3 3  1  1   3      Review of Systems  Constitutional: Negative for fever.  Eyes: Positive for photophobia. Negative for visual disturbance.  Respiratory: Negative for cough, chest tightness and shortness of breath.   Cardiovascular: Negative for chest pain.  Gastrointestinal: Negative for nausea, vomiting and abdominal pain.  Genitourinary: Negative for dysuria.  Musculoskeletal: Positive for back pain. Negative for neck pain and neck stiffness.  Neurological: Positive for headaches. Negative for dizziness, weakness and numbness.  Psychiatric/Behavioral: Negative for confusion.  All other systems reviewed and are negative.     Allergies  Imitrex  Home Medications   Prior to Admission medications   Medication Sig Start Date End Date Taking? Authorizing Provider  albuterol (PROVENTIL HFA;VENTOLIN HFA) 108 (90 BASE) MCG/ACT inhaler Inhale 1-2 puffs into the lungs every 6 (six) hours as needed for wheezing or shortness  of breath.   Yes Historical Provider, MD  amitriptyline (ELAVIL) 50 MG tablet Take 50 mg by mouth at bedtime.   Yes Historical Provider, MD  atenolol (TENORMIN) 25 MG tablet Take 25 mg by mouth daily.   Yes Historical Provider, MD  cyclobenzaprine (FLEXERIL) 10 MG tablet Take 1 tablet (10 mg total) by mouth 3 (three) times daily as needed for muscle spasms. 04/14/14  Yes Lance Bosch, NP  dicyclomine (BENTYL) 10 MG capsule Take 10 mg by mouth 2 (two) times daily.   Yes Historical Provider, MD  gabapentin (NEURONTIN) 300 MG capsule Take 300 mg by mouth 2 (two) times daily.   Yes Historical Provider, MD  naproxen  (NAPROSYN) 500 MG tablet Take 500 mg by mouth 2 (two) times daily with a meal.   Yes Historical Provider, MD  omeprazole (PRILOSEC) 20 MG capsule Take 1 capsule (20 mg total) by mouth daily. 04/14/14  Yes Lance Bosch, NP  venlafaxine (EFFEXOR) 75 MG tablet Take 75 mg by mouth 2 (two) times daily.   Yes Historical Provider, MD   BP 111/59  Pulse 98  Temp(Src) 98.1 F (36.7 C) (Oral)  Resp 24  Ht 5\' 3"  (1.6 m)  Wt 240 lb (108.863 kg)  BMI 42.52 kg/m2  SpO2 98%  LMP 05/24/2014 Physical Exam  Nursing note and vitals reviewed. Constitutional: She is oriented to person, place, and time. She appears well-developed and well-nourished.  Obese  HENT:  Head: Normocephalic and atraumatic.  Right Ear: External ear normal.  Left Ear: External ear normal.  Eyes: Pupils are equal, round, and reactive to light.  Neck: Neck supple.  Cardiovascular: Normal rate, regular rhythm and normal heart sounds.   Pulmonary/Chest: Effort normal and breath sounds normal. No respiratory distress. She has no wheezes.  Neurological: She is alert and oriented to person, place, and time.  5 out of 5 strength in all 4 extremities  Skin: Skin is warm and dry.  Psychiatric: She has a normal mood and affect.    ED Course  Procedures (including critical care time) Labs Review Labs Reviewed - No data to display  Imaging Review No results found.   EKG Interpretation None      MDM   Final diagnoses:  Migraine without aura and without status migrainosus, not intractable    Patient presents with migraine. Migraine consistent with prior migraines. Has taken Tylenol home without relief. Nonfocal on exam. Afebrile. No evidence of meningismus. Low suspicion for subarachnoid. Patient given a migraine cocktail with improvement. At discharge, patient requesting pain medications for chronic back pain. I have referred her back to her primary physician.  After history, exam, and medical workup I feel the patient has  been appropriately medically screened and is safe for discharge home. Pertinent diagnoses were discussed with the patient. Patient was given return precautions.     Merryl Hacker, MD 06/02/14 [phone]

## 2014-06-02 NOTE — ED Notes (Signed)
Pt reports migraine HA x1 week, hx of same, states this HA feels similar to ones she has had in the past, admits to nausea - no vomiting. Has taken tylenol at home w/o relief. Pt also w/ chronic back and knee pain d/t arthritis.

## 2014-06-02 NOTE — Discharge Instructions (Signed)

## 2014-06-05 ENCOUNTER — Telehealth: Payer: Self-pay | Admitting: Nurse Practitioner

## 2014-06-05 ENCOUNTER — Encounter (HOSPITAL_COMMUNITY): Payer: Self-pay | Admitting: Emergency Medicine

## 2014-06-05 NOTE — Telephone Encounter (Signed)
Patient requesting an earlier appointment with Gloria Hawking, NP than 10/11/14.  Patient has been experiencing Migraines every week and went to ER on last Thursday.  Please call and advise.

## 2014-06-05 NOTE — Telephone Encounter (Signed)
Called spouse's number and left message for patient to return the call.

## 2014-06-05 NOTE — Telephone Encounter (Signed)
Tried calling patient, number given [phone] states that it is not a working number, patient complaining about Migraines, which is a new problem and should be scheduled with Dr Leta Baptist.

## 2014-06-05 NOTE — Telephone Encounter (Signed)
Patient returning call, please call [phone].

## 2014-06-05 NOTE — Telephone Encounter (Signed)
Tried contacting patient again, number [phone] is not correct, when patient returns the call please schedule patient with Dr Leta Baptist.

## 2014-06-07 NOTE — Telephone Encounter (Signed)
Called patient's spouse and left message for patient to call back and schedule appointment with NP LL, also patient needs to update her phone number [phone] is not a working number.

## 2014-06-07 NOTE — Telephone Encounter (Signed)
Patient returned call and appointment was scheduled with LL, NP on 06/08/14 @ 1:30.

## 2014-06-07 NOTE — Telephone Encounter (Signed)
Correction patient should be scheduled with Dr. Leta Baptist

## 2014-06-08 ENCOUNTER — Ambulatory Visit (INDEPENDENT_AMBULATORY_CARE_PROVIDER_SITE_OTHER): Payer: Self-pay | Admitting: Diagnostic Neuroimaging

## 2014-06-08 ENCOUNTER — Encounter: Payer: Self-pay | Admitting: Diagnostic Neuroimaging

## 2014-06-08 VITALS — BP 125/86 | HR 98 | Ht 63.0 in | Wt 241.2 lb

## 2014-06-08 DIAGNOSIS — G43109 Migraine with aura, not intractable, without status migrainosus: Secondary | ICD-10-CM

## 2014-06-08 MED ORDER — RIZATRIPTAN BENZOATE 10 MG PO TBDP: 10.0000 mg | ORAL_TABLET | ORAL | Status: DC | PRN

## 2014-06-08 MED ORDER — TOPIRAMATE 50 MG PO TABS: 50.0000 mg | ORAL_TABLET | Freq: Two times a day (BID) | ORAL | Status: DC

## 2014-06-08 NOTE — Progress Notes (Signed)
[facility]  PATIENT: Gloria Hall DOB: 1970/11/01  REFERRING CLINICIAN:  HISTORY FROM: patient  REASON FOR VISIT: new consult   HISTORICAL  CHIEF COMPLAINT:  Chief Complaint  Patient presents with  . Follow-up    HA    HISTORY OF PRESENT ILLNESS:   UPDATE 06/08/14: 43 year old female here for evaluation of migraines. Previous evaluation for different issue. Patient reports history of hit headaches since teenage years. She describes unilateral or bilateral throbbing pulsating pain, with photophobia, phonophobia, triggered by certain smells. Headaches worsen last year. For headache see spots and flashing lights. Sometimes headaches are associated with nausea and vomiting. Patient has 2 episodes of migraine per month, each lasting 3-4 days. Patient's mother and daughter both have migraines.  Patient also has dull nagging daily headache.  Patient previous to tried Imitrex in the past which caused a throat tightness sensation lasting for 15 minutes. Patient did not have actual tongue or throat swelling, rash, itching.  Patient also taking Tylenol 2500 mg tabs at a time, up to 6 tablets per day. She's been doing this for past 1 month.   REVIEW OF SYSTEMS: Full 14 system review of systems performed and notable only for fatigue excessive sweating ear pain ringing in ears light sensitivity blurred vision wheezing shortness of breath abdominal pain constipation diarrhea nausea excessive thirst dermatology swollen lymph nodes dizziness headache numbness weakness frequent urination urgency joint pain joint swelling back pain achy muscles cramps walking difficult and neck pain neck stiffness agitation confusion depression anxiety restless legs insomnia possible apnea daytime sleepiness sleep talking leg swelling.  ALLERGIES: Allergies  Allergen Reactions  . Imitrex [Sumatriptan] Other (See Comments)    Throat tightness x 15 minutes; no swelling or itching    HOME  MEDICATIONS: Outpatient Prescriptions Prior to Visit  Medication Sig Dispense Refill  . albuterol (PROVENTIL HFA;VENTOLIN HFA) 108 (90 BASE) MCG/ACT inhaler Inhale 1-2 puffs into the lungs every 6 (six) hours as needed for wheezing or shortness of breath.    Marland Kitchen amitriptyline (ELAVIL) 50 MG tablet Take 50 mg by mouth at bedtime.    Marland Kitchen atenolol (TENORMIN) 25 MG tablet Take 25 mg by mouth daily.    . cyclobenzaprine (FLEXERIL) 10 MG tablet Take 1 tablet (10 mg total) by mouth 3 (three) times daily as needed for muscle spasms. 30 tablet 2  . dicyclomine (BENTYL) 10 MG capsule Take 10 mg by mouth 2 (two) times daily.    Marland Kitchen gabapentin (NEURONTIN) 300 MG capsule Take 300 mg by mouth 2 (two) times daily.    . naproxen (NAPROSYN) 500 MG tablet Take 500 mg by mouth 2 (two) times daily with a meal.    . omeprazole (PRILOSEC) 20 MG capsule Take 1 capsule (20 mg total) by mouth daily. 90 capsule 3  . venlafaxine (EFFEXOR) 75 MG tablet Take 75 mg by mouth 2 (two) times daily.     No facility-administered medications prior to visit.    PAST MEDICAL HISTORY: Past Medical History  Diagnosis Date  . Anemia   . Anxiety   . LSIL (low grade squamous intraepithelial lesion) on Pap smear 08/21/2011  . Migraines   . Arthritis   . Bronchitis   . IBS (irritable bowel syndrome)   . Peripheral neuropathic pain     in hands  . Hypertension   . Back pain, chronic     PAST SURGICAL HISTORY: Past Surgical History  Procedure Laterality Date  . Tubal ligation  1993  . Dilation and curretage  after a miscarriage  . Endometrial ablation    . Laparoscopy  03/19/2011    Procedure: LAPAROSCOPY OPERATIVE;  Surgeon: Gloria Pina C. Hulan Fray, MD;  Location: [facility];  Service: Gynecology;  Laterality: N/A;  . Novasure ablation  03/19/2011    Procedure: NOVASURE ABLATION;  Surgeon: Gloria Pina C. Hulan Fray, MD;  Location: [facility];  Service: Gynecology;  Laterality: N/A;  . Knee arthroscopy Left 12-01-13    FAMILY HISTORY: Family History    Problem Relation Age of Onset  . Diabetes Mother   . Hypertension Mother   . Hyperlipidemia Mother   . Fibromyalgia Mother   . Sleep apnea Mother   . Diabetes Maternal Aunt   . Cancer Maternal Aunt     breast, metastatic cancer throughout body  . Breast cancer Maternal Aunt   . Crohn's disease Maternal Aunt   . Cancer Maternal Uncle     leukemia  . Diabetes Maternal Grandmother   . Cancer Maternal Uncle     colon  . Heart attack Father     SOCIAL HISTORY:  History   Social History  . Marital Status: Married    Spouse Name: Gloria Hall    Number of Children: 3  . Years of Education: 12th   Occupational History  .      [employer]   Social History Main Topics  . Smoking status: Former Smoker -- 2.00 packs/day for 10 years    Types: Cigarettes    Quit date: 05/03/2009  . Smokeless tobacco: Never Used  . Alcohol Use: No     Comment: quit in 2008  . 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   Marland Kitchen Sexual Activity: Yes    Birth Control/ Protection: Surgical   Other Topics Concern  . Not on file   Social History Narrative   Patient lives at home with spouse.   Caffeine Use: 2 cups of coffee and 2 cups of tea daily     PHYSICAL EXAM  Filed Vitals:   06/08/14 1329  BP: 125/86  Pulse: 98  Height: 5\' 3"  (1.6 m)  Weight: 241 lb 3.2 oz (109.408 kg)    Body mass index is 42.74 kg/(m^2).  No exam data present  No flowsheet data found.  GENERAL EXAM: Well developed, nourished and groomed; neck is supple; SITTING IN DARKENED ROOM. RUBBING HEAD. MILD DISTRESS.  CARDIOVASCULAR: Regular rate and rhythm, no murmurs, no carotid bruits  NEUROLOGIC: MENTAL STATUS: awake, alert, oriented to person, place and time, recent and remote memory intact, normal attention and concentration, language fluent, comprehension intact, naming intact, fund of knowledge appropriate CRANIAL NERVE: no papilledema on  fundoscopic exam, pupils equal and reactive to light, visual fields full to confrontation, extraocular muscles intact, no nystagmus, facial sensation and strength symmetric, hearing intact, palate elevates symmetrically, uvula midline, shoulder shrug symmetric, tongue midline. MOTOR: normal bulk and tone, full strength in the BUE, BLE SENSORY: normal and symmetric to light touch, pinprick, temperature, vibration COORDINATION: finger-nose-finger, fine finger movements normal REFLEXES: deep tendon reflexes present and symmetric GAIT/STATION: narrow based gait; romberg is negative    DIAGNOSTIC DATA (LABS, IMAGING, TESTING) - I reviewed patient records, labs, notes, testing and imaging myself where available.  Lab Results  Component Value Date   WBC 8.2 02/04/2014   HGB 11.9* 02/04/2014   HCT 35.0* 02/04/2014   MCV 81.9 02/04/2014   PLT 301 02/04/2014  Component Value Date/Time   NA 137 02/04/2014 1433   K 3.6* 02/04/2014 1433   CL 105 02/04/2014 1433   CO2 26 10/05/2012 1130   GLUCOSE 112* 02/04/2014 1433   BUN 10 02/04/2014 1433   CREATININE 0.70 02/04/2014 1433   CALCIUM 9.3 10/05/2012 1130   PROT 6.7 05/27/2013 1006   PROT 7.3 10/05/2012 1130   ALBUMIN 3.4* 10/05/2012 1130   AST 15 10/05/2012 1130   ALT 10 10/05/2012 1130   ALKPHOS 70 10/05/2012 1130   BILITOT 0.5 10/05/2012 1130   GFRNONAA >90 10/05/2012 1130   GFRAA >90 10/05/2012 1130   Lab Results  Component Value Date   CHOL 181 02/20/2014   HDL 56 02/20/2014   LDLCALC 99 02/20/2014   TRIG 129 02/20/2014   CHOLHDL 3.2 02/20/2014   Lab Results  Component Value Date   HGBA1C 5.6 02/17/2014   Lab Results  Component Value Date   VITAMINB12 267 05/27/2013   Lab Results  Component Value Date   TSH 3.068 04/14/2014    02/04/14 CT head - normal     ASSESSMENT AND PLAN  43 y.o. year old female here with migraine headaches since teenage years. Patient has migraine with aura. Now having 6-8 days of  migraine per month. Will start topiramate for migraine prevention. Patient previously had adverse reaction to Imitrex, but I do not think this was an allergic reaction. I think this was a side effect. We'll try her on a different triptan to see if this is better tolerated.  PLAN: - TPX + riztriptan prn  Meds ordered this encounter  Medications  . topiramate (TOPAMAX) 50 MG tablet    Sig: Take 1 tablet (50 mg total) by mouth 2 (two) times daily.    Dispense:  60 tablet    Refill:  6  . rizatriptan (MAXALT-MLT) 10 MG disintegrating tablet    Sig: Take 1 tablet (10 mg total) by mouth as needed for migraine. May repeat in 2 hours if needed    Dispense:  9 tablet    Refill:  3    Return in about 3 months (around 09/08/2014).    Penni Bombard, MD 70/01/2375, 2:83 PM Certified in Neurology, Neurophysiology and Neuroimaging  [facility] [address] [place], [place] [address] [phone]

## 2014-06-08 NOTE — Telephone Encounter (Signed)
Patient was switched to Dr Gladstone Lighter schedule

## 2014-06-08 NOTE — Patient Instructions (Signed)
Try topiramate daily.  Try rizatriptan as needed for headache.  Reduce daily tylenol.

## 2014-06-10 ENCOUNTER — Encounter (HOSPITAL_COMMUNITY): Payer: Self-pay | Admitting: *Deleted

## 2014-06-10 ENCOUNTER — Emergency Department (INDEPENDENT_AMBULATORY_CARE_PROVIDER_SITE_OTHER)
Admission: EM | Admit: 2014-06-10 | Discharge: 2014-06-10 | Disposition: A | Discharge status: Home / Self Care | Payer: Self-pay | Source: Home / Self Care

## 2014-06-10 DIAGNOSIS — B86 Scabies: Secondary | ICD-10-CM

## 2014-06-10 MED ORDER — TRIAMCINOLONE ACETONIDE 0.1 % EX CREA: 1.0000 "application " | TOPICAL_CREAM | Freq: Two times a day (BID) | CUTANEOUS | Status: DC

## 2014-06-10 MED ORDER — PERMETHRIN 5 % EX CREA: TOPICAL_CREAM | CUTANEOUS | Status: DC

## 2014-06-10 NOTE — ED Provider Notes (Signed)
Gloria Hall is a 43 y.o. female who presents to Urgent Care today for Rash. Patient has itchy pruritic rash scattered across her body present for the last week. She has some lesions on her hand. Nobody else with similar rash. She has tried hydrocortisone alcohol. No fevers or chills nausea vomiting or diarrhea. No new soaps detergents or shampoos. No new medications. She feels well otherwise.   Past Medical History  Diagnosis Date  . Anemia   . Anxiety   . LSIL (low grade squamous intraepithelial lesion) on Pap smear 08/21/2011  . Migraines   . Arthritis   . Bronchitis   . IBS (irritable bowel syndrome)   . Peripheral neuropathic pain     in hands  . Hypertension   . Back pain, chronic    Past Surgical History  Procedure Laterality Date  . Tubal ligation  1993  . Dilation and curretage      after a miscarriage  . Endometrial ablation    . Laparoscopy  03/19/2011    Procedure: LAPAROSCOPY OPERATIVE;  Surgeon: Juliene Pina C. Hulan Fray, MD;  Location: [facility];  Service: Gynecology;  Laterality: N/A;  . Novasure ablation  03/19/2011    Procedure: NOVASURE ABLATION;  Surgeon: Juliene Pina C. Hulan Fray, MD;  Location: [facility];  Service: Gynecology;  Laterality: N/A;  . Knee arthroscopy Left 12-01-13   History  Substance Use Topics  . Smoking status: Former Smoker -- 2.00 packs/day for 10 years    Types: Cigarettes    Quit date: 05/03/2009  . Smokeless tobacco: Never Used  . Alcohol Use: No     Comment: quit in 2008   ROS as above Medications: No current facility-administered medications for this encounter.   Current Outpatient Prescriptions  Medication Sig Dispense Refill  . albuterol (PROVENTIL HFA;VENTOLIN HFA) 108 (90 BASE) MCG/ACT inhaler Inhale 1-2 puffs into the lungs every 6 (six) hours as needed for wheezing or shortness of breath.    Marland Kitchen amitriptyline (ELAVIL) 50 MG tablet Take 50 mg by mouth at bedtime.    Marland Kitchen atenolol (TENORMIN) 25 MG tablet Take 25 mg by mouth daily.    . cyclobenzaprine  (FLEXERIL) 10 MG tablet Take 1 tablet (10 mg total) by mouth 3 (three) times daily as needed for muscle spasms. 30 tablet 2  . dicyclomine (BENTYL) 10 MG capsule Take 10 mg by mouth 2 (two) times daily.    Marland Kitchen gabapentin (NEURONTIN) 300 MG capsule Take 300 mg by mouth 2 (two) times daily.    . naproxen (NAPROSYN) 500 MG tablet Take 500 mg by mouth 2 (two) times daily with a meal.    . omeprazole (PRILOSEC) 20 MG capsule Take 1 capsule (20 mg total) by mouth daily. 90 capsule 3  . permethrin (ELIMITE) 5 % cream Apply from the neck down at night and wash off in the morning once 180 g 1  . rizatriptan (MAXALT-MLT) 10 MG disintegrating tablet Take 1 tablet (10 mg total) by mouth as needed for migraine. May repeat in 2 hours if needed 9 tablet 3  . topiramate (TOPAMAX) 50 MG tablet Take 1 tablet (50 mg total) by mouth 2 (two) times daily. 60 tablet 6  . triamcinolone cream (KENALOG) 0.1 % Apply 1 application topically 2 (two) times daily. 30 g 0  . venlafaxine (EFFEXOR) 75 MG tablet Take 75 mg by mouth 2 (two) times daily.     Allergies  Allergen Reactions  . Imitrex [Sumatriptan] Other (See Comments)    Throat tightness x 15  minutes; no swelling or itching     Exam:  BP 121/81 mmHg  Pulse 105  Temp(Src) 98.3 F (36.8 C) (Oral)  Resp 16  SpO2 97%  LMP 05/24/2014 Gen: Well NAD Skin: small erythematous nontender blanchable papules scattered across body.  No results found for this or any previous visit (from the past 24 hour(s)). No results found.  Assessment and Plan: 43 y.o. female with scabies or bed bugs. Treatment with permetherin.   NOTE: Patient was discharged and after D/c a bed bug was found in the room.  We are attempting to contact the patient for evaluation of bed bug.   Discussed warning signs or symptoms. Please see discharge instructions. Patient expresses understanding.     Gregor Hams, MD 06/10/14 1025

## 2014-06-10 NOTE — Discharge Instructions (Signed)
Thank you for coming in today. °Apply the permethrin cream from the neck down at night and wash off in the morning. °Use Benadryl at night as needed for itching. °Use Gold Bond Itch as needed. °Come back if not getting better or worsening. ° ° °Scabies °Scabies are small bugs (mites) that burrow under the skin and cause red bumps and severe itching. These bugs can only be seen with a microscope. Scabies are highly contagious. They can spread easily from person to person by direct contact. They are also spread through sharing clothing or linens that have the scabies mites living in them. It is not unusual for an entire family to become infected through shared towels, clothing, or bedding.  °HOME CARE INSTRUCTIONS  °· Your caregiver may prescribe a cream or lotion to kill the mites. If cream is prescribed, massage the cream into the entire body from the neck to the bottom of both feet. Also massage the cream into the scalp and face if your child is less than 1 year old. Avoid the eyes and mouth. Do not wash your hands after application. °· Leave the cream on for 8 to 12 hours. Your child should bathe or shower after the 8 to 12 hour application period. Sometimes it is helpful to apply the cream to your child right before bedtime. °· One treatment is usually effective and will eliminate approximately 95% of infestations. For severe cases, your caregiver may decide to repeat the treatment in 1 week. Everyone in your household should be treated with one application of the cream. °· New rashes or burrows should not appear within 24 to 48 hours after successful treatment. However, the itching and rash may last for 2 to 4 weeks after successful treatment. Your caregiver may prescribe a medicine to help with the itching or to help the rash go away more quickly. °· Scabies can live on clothing or linens for up to 3 days. All of your child's recently used clothing, towels, stuffed toys, and bed linens should be washed in hot  water and then dried in a dryer for at least 20 minutes on high heat. Items that cannot be washed should be enclosed in a plastic bag for at least 3 days. °· To help relieve itching, bathe your child in a cool bath or apply cool washcloths to the affected areas. °· Your child may return to school after treatment with the prescribed cream. °SEEK MEDICAL CARE IF:  °· The itching persists longer than 4 weeks after treatment. °· The rash spreads or becomes infected. Signs of infection include red blisters or yellow-tan crust. °Document Released: 07/21/2005 Document Revised: 10/13/2011 Document Reviewed: 11/29/2008 °Exit[employer]® Patient Information ©2015 [employer]. This information is not intended to replace advice given to you by your health care provider. Make sure you discuss any questions you have with your health care provider. ° °

## 2014-06-10 NOTE — ED Notes (Signed)
Pt  Reports  Symptoms  Of   A  Rash that  Itches     X   1  Week      She  denys  Any  Known causative  Agent      She  Displays  No angioedema

## 2014-06-12 NOTE — ED Notes (Signed)
Patient called to ask how to take care of bedbug problem in home. Advised to clean through w vacuum cleaner , to use which ever treatment the pharmacist of her choice should recommend.

## 2014-06-22 ENCOUNTER — Encounter: Payer: Self-pay | Admitting: Diagnostic Neuroimaging

## 2014-07-19 ENCOUNTER — Ambulatory Visit (INDEPENDENT_AMBULATORY_CARE_PROVIDER_SITE_OTHER): Payer: Self-pay | Admitting: Obstetrics & Gynecology

## 2014-07-19 ENCOUNTER — Encounter: Payer: Self-pay | Admitting: Obstetrics & Gynecology

## 2014-07-19 VITALS — BP 114/67 | HR 78 | Wt 231.2 lb

## 2014-07-19 DIAGNOSIS — N938 Other specified abnormal uterine and vaginal bleeding: Secondary | ICD-10-CM | POA: Insufficient documentation

## 2014-07-19 MED ORDER — MEDROXYPROGESTERONE ACETATE 10 MG PO TABS: 20.0000 mg | ORAL_TABLET | Freq: Every day | ORAL | Status: DC

## 2014-07-19 NOTE — Patient Instructions (Signed)
Dysfunctional Uterine Bleeding Normally, menstrual periods begin between ages 11 to 17 in young women. A normal menstrual cycle/period may begin every 23 days up to 35 days and lasts from 1 to 7 days. Around 12 to 14 days before your menstrual period starts, ovulation (ovary produces an egg) occurs. When counting the time between menstrual periods, count from the first day of bleeding of the previous period to the first day of bleeding of the next period. Dysfunctional (abnormal) uterine bleeding is bleeding that is different from a normal menstrual period. Your periods may come earlier or later than usual. They may be lighter, have blood clots or be heavier. You may have bleeding between periods, or you may skip one period or more. You may have bleeding after sexual intercourse, bleeding after menopause, or no menstrual period. CAUSES   Pregnancy (normal, miscarriage, tubal).  IUDs (intrauterine device, birth control).  Birth control pills.  Hormone treatment.  Menopause.  Infection of the cervix.  Blood clotting problems.  Infection of the inside lining of the uterus.  Endometriosis, inside lining of the uterus growing in the pelvis and other female organs.  Adhesions (scar tissue) inside the uterus.  Obesity or severe weight loss.  Uterine polyps inside the uterus.  Cancer of the vagina, cervix, or uterus.  Ovarian cysts or polycystic ovary syndrome.  Medical problems (diabetes, thyroid disease).  Uterine fibroids (noncancerous tumor).  Problems with your female hormones.  Endometrial hyperplasia, very thick lining and enlarged cells inside of the uterus.  Medicines that interfere with ovulation.  Radiation to the pelvis or abdomen.  Chemotherapy. DIAGNOSIS   Your doctor will discuss the history of your menstrual periods, medicines you are taking, changes in your weight, stress in your life, and any medical problems you may have.  Your doctor will do a physical  and pelvic examination.  Your doctor may want to perform certain tests to make a diagnosis, such as:  Pap test.  Blood tests.  Cultures for infection.  CT scan.  Ultrasound.  Hysteroscopy.  Laparoscopy.  MRI.  Hysterosalpingography.  D and C.  Endometrial biopsy. TREATMENT  Treatment will depend on the cause of the dysfunctional uterine bleeding (DUB). Treatment may include:  Observing your menstrual periods for a couple of months.  Prescribing medicines for medical problems, including:  Antibiotics.  Hormones.  Birth control pills.  Removing an IUD (intrauterine device, birth control).  Surgery:  D and C (scrape and remove tissue from inside the uterus).  Laparoscopy (examine inside the abdomen with a lighted tube).  Uterine ablation (destroy lining of the uterus with electrical current, laser, heat, or freezing).  Hysteroscopy (examine cervix and uterus with a lighted tube).  Hysterectomy (remove the uterus). HOME CARE INSTRUCTIONS   If medicines were prescribed, take exactly as directed. Do not change or switch medicines without consulting your caregiver.  Long term heavy bleeding may result in iron deficiency. Your caregiver may have prescribed iron pills. They help replace the iron that your body lost from heavy bleeding. Take exactly as directed.  Do not take aspirin or medicines that contain aspirin one week before or during your menstrual period. Aspirin may make the bleeding worse.  If you need to change your sanitary pad or tampon more than once every 2 hours, stay in bed with your feet elevated and a cold pack on your lower abdomen. Rest as much as possible, until the bleeding stops or slows down.  Eat well-balanced meals. Eat foods high in iron. Examples   are:  Leafy green vegetables.  Whole-grain breads and cereals.  Eggs.  Meat.  Liver.  Do not try to lose weight until the abnormal bleeding has stopped and your blood iron level is  back to normal. Do not lift more than ten pounds or do strenuous activities when you are bleeding.  For a couple of months, make note on your calendar, marking the start and ending of your period, and the type of bleeding (light, medium, heavy, spotting, clots or missed periods). This is for your caregiver to better evaluate your problem. SEEK MEDICAL CARE IF:   You develop nausea (feeling sick to your stomach) and vomiting, dizziness, or diarrhea while you are taking your medicine.  You are getting lightheaded or weak.  You have any problems that may be related to the medicine you are taking.  You develop pain with your DUB.  You want to remove your IUD.  You want to stop or change your birth control pills or hormones.  You have any type of abnormal bleeding mentioned above.  You are over 16 years old and have not had a menstrual period yet.  You are 43 years old and you are still having menstrual periods.  You have any of the symptoms mentioned above.  You develop a rash. SEEK IMMEDIATE MEDICAL CARE IF:   An oral temperature above 102 F (38.9 C) develops.  You develop chills.  You are changing your sanitary pad or tampon more than once an hour.  You develop abdominal pain.  You pass out or faint. Document Released: 07/18/2000 Document Revised: 10/13/2011 Document Reviewed: 06/19/2009 ExitCare Patient Information 2015 [employer]. This information is not intended to replace advice given to you by your health care provider. Make sure you discuss any questions you have with your health care provider.  

## 2014-07-19 NOTE — Progress Notes (Signed)
Patient scheduled for colpo. 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 Per guidelines patient to have repeat pap with cotesting in 1 year. Dr. Roselie Awkward ops to do repeat pap today-- no colposcopy. Patient also c/o of irregular periods-- reports bleeds every two weeks, at times heavy.

## 2014-07-19 NOTE — Progress Notes (Signed)
Subjective:     Patient ID: Gloria Hall, female   DOB: 1970/09/18, 43 y.o.   MRN: [id number]  HPI [id number] Patient's last menstrual period was 07/04/2014. 10/2013 pap neg cytology and pos HR HPV. Was recommended colposcopy at first   Review of Systems  Genitourinary: Positive for menstrual problem (menses every other week.).       Objective:   Physical Exam  Constitutional: She is oriented to person, place, and time. She appears well-developed. No distress.  Genitourinary: Vagina normal. No vaginal discharge found.  Cx nl no lesion pap done  Neurological: She is alert and oriented to person, place, and time.  Psychiatric: She has a normal mood and affect. Her behavior is normal.       Assessment:     Neg cytology pos HR HPV of for repeat pap DUB s/p ablation     Plan:    MPA 20 mg daily US pelvis dx DUB F/u pap result  RTC 3 months  Woodroe Mode, MD 07/19/2014

## 2014-07-21 LAB — CYTOLOGY - PAP

## 2014-07-24 ENCOUNTER — Ambulatory Visit (HOSPITAL_COMMUNITY): Payer: Self-pay

## 2014-09-11 ENCOUNTER — Ambulatory Visit: Payer: Self-pay | Admitting: Diagnostic Neuroimaging

## 2014-09-12 ENCOUNTER — Encounter: Payer: Self-pay | Admitting: Diagnostic Neuroimaging

## 2014-09-13 ENCOUNTER — Ambulatory Visit: Payer: Self-pay

## 2014-10-06 ENCOUNTER — Encounter: Payer: Self-pay | Admitting: General Practice

## 2014-10-11 ENCOUNTER — Ambulatory Visit: Payer: Self-pay | Admitting: Diagnostic Neuroimaging

## 2014-10-11 ENCOUNTER — Ambulatory Visit: Payer: Self-pay | Admitting: Nurse Practitioner

## 2014-10-12 ENCOUNTER — Encounter: Payer: Self-pay | Admitting: Diagnostic Neuroimaging

## 2015-05-22 ENCOUNTER — Other Ambulatory Visit: Payer: Self-pay

## 2016-05-01 ENCOUNTER — Ambulatory Visit (INDEPENDENT_AMBULATORY_CARE_PROVIDER_SITE_OTHER): Payer: Self-pay | Admitting: Obstetrics & Gynecology

## 2016-05-01 ENCOUNTER — Encounter: Payer: Self-pay | Admitting: Obstetrics & Gynecology

## 2016-05-01 DIAGNOSIS — N951 Menopausal and female climacteric states: Secondary | ICD-10-CM | POA: Insufficient documentation

## 2016-05-01 NOTE — Progress Notes (Signed)
Patient ID: Gloria Hall, female   DOB: 03/19/1971, 45 y.o.   MRN: [id number]  Cc: irregular menses and hot flashes and sweats  HPI Gloria Hall is a 45 y.o. female.  [id number] LMP 01/2016, previous menses 08/2015, notes hot flushes and night sweats HPI  Past Medical History:  Diagnosis Date  . Anemia   . Anxiety   . Arthritis   . Back pain, chronic   . Bronchitis   . Hypertension   . IBS (irritable bowel syndrome)   . LSIL (low grade squamous intraepithelial lesion) on Pap smear 08/21/2011  . Migraines   . 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 dilation and curretage     after a miscarriage  . ENDOMETRIAL ABLATION    . KNEE ARTHROSCOPY Left 12-01-13  . LAPAROSCOPY  03/19/2011   Procedure: LAPAROSCOPY OPERATIVE;  Surgeon: Hollie Salk C. Marice Potter, MD;  Location: [facility];  Service: Gynecology;  Laterality: N/A;  . NOVASURE ABLATION  03/19/2011   Procedure: NOVASURE ABLATION;  Surgeon: Hollie Salk C. Marice Potter, MD;  Location: [facility];  Service: Gynecology;  Laterality: N/A;  . TUBAL LIGATION  1993    Family History  Problem Relation Age of Onset  . Diabetes Mother   . Hypertension Mother   . Hyperlipidemia Mother   . Fibromyalgia Mother   . Sleep apnea Mother   . Diabetes Maternal Aunt   . Cancer Maternal Aunt     breast, metastatic cancer throughout body  . Breast cancer Maternal Aunt   . Crohn's disease Maternal Aunt   . Cancer Maternal Uncle     leukemia  . Diabetes Maternal Grandmother   . Cancer Maternal Uncle     colon  . Heart attack Father     Social History Social History  Substance Use Topics  . Smoking status: Former Smoker    Packs/day: 2.00    Years: 10.00    Types: Cigarettes    Quit date: 05/03/2009  . Smokeless tobacco: Never Used  . Alcohol use No     Comment: quit in 2008    Allergies  Allergen Reactions  . Imitrex [Sumatriptan] Other (See Comments)    Throat tightness x 15 minutes; no swelling or  itching    Current Outpatient Prescriptions  Medication Sig Dispense Refill  . amitriptyline (ELAVIL) 50 MG tablet Take 50 mg by mouth at bedtime.    Marland Kitchen HYDROcodone-acetaminophen (NORCO/VICODIN) 5-325 MG tablet Take 1 tablet by mouth 2 (two) times daily as needed for moderate pain.    . naproxen (NAPROSYN) 500 MG tablet Take 500 mg by mouth 2 (two) times daily with a meal.    . albuterol (PROVENTIL HFA;VENTOLIN HFA) 108 (90 BASE) MCG/ACT inhaler Inhale 1-2 puffs into the lungs every 6 (six) hours as needed for wheezing or shortness of breath.    Marland Kitchen atenolol (TENORMIN) 25 MG tablet Take 25 mg by mouth daily.    . cyclobenzaprine (FLEXERIL) 10 MG tablet Take 1 tablet (10 mg total) by mouth 3 (three) times daily as needed for muscle spasms. (Patient not taking: Reported on 05/01/2016) 30 tablet 2  . dicyclomine (BENTYL) 10 MG capsule Take 10 mg by mouth 2 (two) times daily.    Marland Kitchen gabapentin (NEURONTIN) 300 MG capsule Take 300 mg by mouth 2 (two) times daily.    . medroxyPROGESTERone (PROVERA) 10 MG tablet Take 2 tablets (20 mg total) by mouth daily. (Patient not taking: Reported  on 05/01/2016) 30 tablet 2  . omeprazole (PRILOSEC) 20 MG capsule Take 1 capsule (20 mg total) by mouth daily. (Patient not taking: Reported on 05/01/2016) 90 capsule 3  . permethrin (ELIMITE) 5 % cream Apply from the neck down at night and wash off in the morning once (Patient not taking: Reported on 05/01/2016) 180 g 1  . rizatriptan (MAXALT-MLT) 10 MG disintegrating tablet Take 1 tablet (10 mg total) by mouth as needed for migraine. May repeat in 2 hours if needed (Patient not taking: Reported on 05/01/2016) 9 tablet 3  . topiramate (TOPAMAX) 50 MG tablet Take 1 tablet (50 mg total) by mouth 2 (two) times daily. (Patient not taking: Reported on 05/01/2016) 60 tablet 6  . triamcinolone cream (KENALOG) 0.1 % Apply 1 application topically 2 (two) times daily. (Patient not taking: Reported on 05/01/2016) 30 g 0  . venlafaxine (EFFEXOR)  75 MG tablet Take 75 mg by mouth 2 (two) times daily.     No current facility-administered medications for this visit.     Review of Systems Review of Systems  Endocrine:       VMS  Genitourinary: Negative for menstrual problem, vaginal bleeding and vaginal discharge.    Weight 218 lb 8 oz (99.1 kg), last menstrual period 07/04/2014.  Physical Exam Physical Exam  Constitutional: She is oriented to person, place, and time. She appears well-developed. No distress.  Pulmonary/Chest: Effort normal.  Neurological: She is alert and oriented to person, place, and time.  Psychiatric: She has a normal mood and affect. Her behavior is normal.    Data Reviewed Pap results  Assessment    Perimenopausal VMS, irregular menses    Plan    Provera and estrace for sx [facility] today RTC 6 mo for annual exam   Mammogram is due, ordered    Emireth Cockerham 05/01/2016, 3:26 PM

## 2016-05-02 LAB — FOLLICLE STIMULATING HORMONE: FSH: 72 m[IU]/mL

## 2016-05-07 ENCOUNTER — Telehealth: Payer: Self-pay | Admitting: General Practice

## 2016-05-07 NOTE — Telephone Encounter (Signed)
Called patient & informed her of bloodwork results. Patient verbalized understanding & had no questions.

## 2016-05-21 ENCOUNTER — Other Ambulatory Visit: Payer: Self-pay | Admitting: Obstetrics & Gynecology

## 2016-05-21 DIAGNOSIS — Z1231 Encounter for screening mammogram for malignant neoplasm of breast: Secondary | ICD-10-CM

## 2016-07-01 ENCOUNTER — Telehealth (HOSPITAL_COMMUNITY): Payer: Self-pay | Admitting: *Deleted

## 2016-07-01 NOTE — Telephone Encounter (Signed)
Attempted to call patient to schedule appointment with [facility]. No one answered phone. Left voicemail for patient to call me back.

## 2016-07-02 ENCOUNTER — Other Ambulatory Visit (HOSPITAL_COMMUNITY): Payer: Self-pay | Admitting: *Deleted

## 2016-07-02 DIAGNOSIS — N644 Mastodynia: Secondary | ICD-10-CM

## 2016-07-24 ENCOUNTER — Ambulatory Visit (HOSPITAL_COMMUNITY): Payer: Self-pay

## 2016-07-24 ENCOUNTER — Encounter (HOSPITAL_COMMUNITY): Payer: Self-pay

## 2016-07-24 ENCOUNTER — Ambulatory Visit
Admission: RE | Admit: 2016-07-24 | Discharge: 2016-07-24 | Disposition: A | Discharge status: Home / Self Care | Payer: No Typology Code available for payment source | Source: Ambulatory Visit | Attending: Obstetrics and Gynecology | Admitting: Obstetrics and Gynecology

## 2016-07-24 ENCOUNTER — Ambulatory Visit (HOSPITAL_COMMUNITY)
Admission: RE | Admit: 2016-07-24 | Discharge: 2016-07-24 | Disposition: A | Discharge status: Home / Self Care | Payer: Self-pay | Source: Ambulatory Visit | Attending: Obstetrics and Gynecology | Admitting: Obstetrics and Gynecology

## 2016-07-24 VITALS — BP 136/90 | Temp 98.5°F | Ht 63.0 in | Wt 223.4 lb

## 2016-07-24 DIAGNOSIS — N644 Mastodynia: Secondary | ICD-10-CM

## 2016-07-24 DIAGNOSIS — Z01419 Encounter for gynecological examination (general) (routine) without abnormal findings: Secondary | ICD-10-CM

## 2016-07-24 NOTE — Patient Instructions (Signed)
Explained breast self awareness to Gloria Hall. Let patient know that due to her history of HPV and an abnormal Pap smear that her next Pap smear will be based on the result of today's Pap smear. Referred patient to the [facility] for a diagnostic mammogram and possible left breast ultrasound. Appointment scheduled for Thursday, July 24, 2016 at 0850. Let patient know will follow up with her within the next couple weeks with results of Pap smear by phone. Gloria Hall verbalized understanding.  Kenniya Westrich, Arvil Chaco, RN 9:12 AM

## 2016-07-24 NOTE — Progress Notes (Signed)
Pap smear completed

## 2016-07-24 NOTE — Progress Notes (Signed)
Complaints of left outer breast pain x 1.5 years. Patient states the pain comes and goes. Patient stated the pain is occurring more frequently. Patient rates the pain at a 8 out of 10.  Pap Smear: Pap smear completed today. Last Pap smear was 07/19/2014 at [facility] [place] [facility] and normal with negative HPV. Patients prior two Pap smears 10/27/2013 and 10/22/2012 were normal with positive HPV. Patient's Pap smear 08/21/2011 was normal. Patient has a history of one abnormal Pap smear 01/09/2011 that was LGSIL. Patient had a colposcopy to follow up for abnormal Pap smear. Above results are in EPIC, except for the colposcopy result.  Physical exam: Breasts Breasts symmetrical. No skin abnormalities bilateral breasts. No nipple retraction bilateral breasts. No nipple discharge bilateral breasts. No lymphadenopathy. No lumps palpated bilateral breasts. Complaints of pain within the left breast at 3 o'clock 12 cm from the nipple. Referred patient to the [facility] for a diagnostic mammogram and possible left breast ultrasound. Appointment scheduled for Thursday, July 24, 2016 at 0850.  Pelvic/Bimanual   Ext Genitalia No lesions, no swelling and no discharge observed on external genitalia.         Vagina Vagina pink and normal texture. No lesions or discharge observed in vagina.          Cervix Cervix is present. Cervix pink and of normal texture. No discharge observed.     Uterus Uterus is present and palpable. Uterus in normal position and normal size.        Adnexae Bilateral ovaries present and palpable. No tenderness on palpation.          Rectovaginal No rectal exam completed today since patient had no rectal complaints. No skin abnormalities observed on exam.    Smoking History: Patient has never smoked.  Patient Navigation: Patient education provided. Access to services provided for patient through [facility] program.

## 2016-07-25 LAB — CYTOLOGY - PAP
Diagnosis: NEGATIVE
HPV (WINDOPATH): NOT DETECTED

## 2016-08-06 ENCOUNTER — Encounter (HOSPITAL_COMMUNITY): Payer: Self-pay | Admitting: *Deleted

## 2016-08-18 ENCOUNTER — Telehealth (HOSPITAL_COMMUNITY): Payer: Self-pay | Admitting: *Deleted

## 2016-08-18 NOTE — Telephone Encounter (Signed)
Telephoned patient at home number and left message.  

## 2016-08-18 NOTE — Telephone Encounter (Signed)
Telephoned patient at home number and discussed negative pap smear results. HPV was negative. Next pap smear due in one year. Patient voiced understanding.

## 2016-08-19 ENCOUNTER — Encounter (HOSPITAL_COMMUNITY): Payer: Self-pay | Admitting: Family Medicine

## 2016-08-19 ENCOUNTER — Ambulatory Visit (HOSPITAL_COMMUNITY)
Admission: EM | Admit: 2016-08-19 | Discharge: 2016-08-19 | Disposition: A | Discharge status: Home / Self Care | Payer: No Typology Code available for payment source | Attending: Family Medicine | Admitting: Family Medicine

## 2016-08-19 DIAGNOSIS — K047 Periapical abscess without sinus: Secondary | ICD-10-CM

## 2016-08-19 MED ORDER — CLINDAMYCIN HCL 300 MG PO CAPS: 300.0000 mg | ORAL_CAPSULE | Freq: Three times a day (TID) | ORAL | 0 refills | Status: DC

## 2016-08-19 NOTE — ED Provider Notes (Signed)
[facility]    CSN: [id number] Arrival date & time: 08/19/16  1900     History   Chief Complaint Chief Complaint  Patient presents with  . Facial Pain    HPI Gloria Hall is a 46 y.o. female.   The history is provided by the patient.  Dental Pain  Location:  Upper Upper teeth location:  11/LU cuspid Quality:  Throbbing Severity:  Moderate Onset quality:  Sudden Duration:  1 day Progression:  Worsening Chronicity:  New Context: abscess and poor dentition   Relieved by:  None tried Worsened by:  Nothing Ineffective treatments:  None tried Associated symptoms: facial pain and facial swelling   Associated symptoms: no fever   Risk factors: lack of dental care     Past Medical History:  Diagnosis Date  . Anemia   . Anxiety   . Arthritis   . Back pain, chronic   . Bronchitis   . Hypertension   . IBS (irritable bowel syndrome)   . LSIL (low grade squamous intraepithelial lesion) on Pap smear 08/21/2011  . Migraines   . Peripheral neuropathic pain    in hands    Patient Active Problem List   Diagnosis Date Noted  . Perimenopausal vasomotor symptoms 05/01/2016  . DUB (dysfunctional uterine bleeding) 07/19/2014  . Bilateral carpal tunnel syndrome 04/12/2014  . Facial cellulitis 05/03/2012  . Anxiety 05/03/2012  . LSIL (low grade squamous intraepithelial lesion) on Pap smear 08/21/2011    Past Surgical History:  Procedure Laterality Date  . DILATION AND CURETTAGE OF UTERUS    . dilation and curretage     after a miscarriage  . ENDOMETRIAL ABLATION    . KNEE ARTHROSCOPY Left 12-01-13  . LAPAROSCOPY  03/19/2011   Procedure: LAPAROSCOPY OPERATIVE;  Surgeon: Juliene Pina C. Hulan Fray, MD;  Location: [facility];  Service: Gynecology;  Laterality: N/A;  . NOVASURE ABLATION  03/19/2011   Procedure: NOVASURE ABLATION;  Surgeon: Juliene Pina C. Hulan Fray, MD;  Location: [facility];  Service: Gynecology;  Laterality: N/A;  . TUBAL LIGATION  1993    OB History    Gravida Para Term  Preterm AB Living   4 3 3   1 3    SAB TAB Ectopic Multiple Live Births   1               Home Medications    Prior to Admission medications   Medication Sig Start Date End Date Taking? Authorizing Provider  albuterol (PROVENTIL HFA;VENTOLIN HFA) 108 (90 BASE) MCG/ACT inhaler Inhale 1-2 puffs into the lungs every 6 (six) hours as needed for wheezing or shortness of breath.    Historical Provider, MD  amitriptyline (ELAVIL) 50 MG tablet Take 50 mg by mouth at bedtime.    Historical Provider, MD  atenolol (TENORMIN) 25 MG tablet Take 25 mg by mouth daily.    Historical Provider, MD  clindamycin (CLEOCIN) 300 MG capsule Take 1 capsule (300 mg total) by mouth 3 (three) times daily. 08/19/16   Billy Fischer, MD  cyclobenzaprine (FLEXERIL) 10 MG tablet Take 1 tablet (10 mg total) by mouth 3 (three) times daily as needed for muscle spasms. Patient not taking: Reported on 07/24/2016 04/14/14   Lance Bosch, NP  dicyclomine (BENTYL) 10 MG capsule Take 10 mg by mouth 2 (two) times daily.    Historical Provider, MD  gabapentin (NEURONTIN) 300 MG capsule Take 300 mg by mouth 2 (two) times daily.    Historical Provider, MD  HYDROcodone-acetaminophen (NORCO/VICODIN) 5-325  MG tablet Take 1 tablet by mouth 2 (two) times daily as needed for moderate pain.    Historical Provider, MD  medroxyPROGESTERone (PROVERA) 10 MG tablet Take 2 tablets (20 mg total) by mouth daily. Patient not taking: Reported on 07/24/2016 07/19/14   Woodroe Mode, MD  naproxen (NAPROSYN) 500 MG tablet Take 500 mg by mouth 2 (two) times daily with a meal.    Historical Provider, MD  omeprazole (PRILOSEC) 20 MG capsule Take 1 capsule (20 mg total) by mouth daily. Patient not taking: Reported on 07/24/2016 04/14/14   Lance Bosch, NP  permethrin (ELIMITE) 5 % cream Apply from the neck down at night and wash off in the morning once Patient not taking: Reported on 05/01/2016 06/10/14   Gregor Hams, MD  rizatriptan (MAXALT-MLT) 10 MG  disintegrating tablet Take 1 tablet (10 mg total) by mouth as needed for migraine. May repeat in 2 hours if needed Patient not taking: Reported on 07/24/2016 06/08/14   Penni Bombard, MD  topiramate (TOPAMAX) 50 MG tablet Take 1 tablet (50 mg total) by mouth 2 (two) times daily. Patient not taking: Reported on 07/24/2016 06/08/14   Penni Bombard, MD  triamcinolone cream (KENALOG) 0.1 % Apply 1 application topically 2 (two) times daily. Patient not taking: Reported on 07/24/2016 06/10/14   Gregor Hams, MD  venlafaxine [facility]) 75 MG tablet Take 75 mg by mouth 2 (two) times daily.    Historical Provider, MD    Family History Family History  Problem Relation Age of Onset  . Diabetes Mother   . Hypertension Mother   . Hyperlipidemia Mother   . Fibromyalgia Mother   . Sleep apnea Mother   . Migraines Mother   . Heart attack Father   . Diabetes Maternal Aunt   . Cancer Maternal Aunt     breast, metastatic cancer throughout body  . Breast cancer Maternal Aunt   . Crohn's disease Maternal Aunt   . Cancer Maternal Uncle     leukemia  . Diabetes Maternal Grandmother   . Cancer Maternal Uncle     colon    Social History Social History  Substance Use Topics  . Smoking status: Former Smoker    Packs/day: 2.00    Years: 10.00    Types: Cigarettes    Quit date: 05/03/2009  . Smokeless tobacco: Never Used  . Alcohol use No     Comment: quit in 2008     Allergies   Imitrex [sumatriptan]   Review of Systems Review of Systems  Constitutional: Negative.  Negative for fever.  HENT: Positive for dental problem and facial swelling.   Respiratory: Negative.   All other systems reviewed and are negative.    Physical Exam Triage Vital Signs ED Triage Vitals [08/19/16 1927]  Enc Vitals Group     BP 125/85     Pulse Rate 88     Resp 18     Temp      Temp src      SpO2 100 %     Weight      Height      Head Circumference      Peak Flow      Pain Score      Pain Loc       Pain Edu?      Excl. in [place]?    No data found.   Updated Vital Signs BP 125/85   Pulse 88   Resp 18  LMP 07/04/2014   SpO2 100%   Visual Acuity Right Eye Distance:   Left Eye Distance:   Bilateral Distance:    Right Eye Near:   Left Eye Near:    Bilateral Near:     Physical Exam  Constitutional: She appears well-developed and well-nourished.  HENT:  Mouth/Throat: Oropharynx is clear and moist and mucous membranes are normal. Abnormal dentition.    Nursing note and vitals reviewed.    UC Treatments / Results  Labs (all labs ordered are listed, but only abnormal results are displayed) Labs Reviewed - No data to display  EKG  EKG Interpretation None       Radiology No results found.  Procedures Procedures (including critical care time)  Medications Ordered in UC Medications - No data to display   Initial Impression / Assessment and Plan / UC Course  I have reviewed the triage vital signs and the nursing notes.  Pertinent labs & imaging results that were available during my care of the patient were reviewed by me and considered in my medical decision making (see chart for details).       Final Clinical Impressions(s) / UC Diagnoses   Final diagnoses:  Dental abscess    New Prescriptions Discharge Medication List as of 08/19/2016  8:02 PM    START taking these medications   Details  clindamycin (CLEOCIN) 300 MG capsule Take 1 capsule (300 mg total) by mouth 3 (three) times daily., Starting Tue 08/19/2016, Normal         Billy Fischer, MD 09/02/16 2202

## 2016-08-19 NOTE — Discharge Instructions (Signed)
Take medicine as prescribed, see your dentist as soon as possible °

## 2016-08-19 NOTE — ED Triage Notes (Addendum)
Pt here for some facial swelling in cheeks. sts hx of same with sinus infection. sts sinus pressure and headaches. Taking sinus medication.

## 2017-07-07 ENCOUNTER — Encounter: Payer: Self-pay | Admitting: Sports Medicine

## 2017-07-07 ENCOUNTER — Other Ambulatory Visit: Payer: Self-pay | Admitting: Sports Medicine

## 2017-07-07 ENCOUNTER — Ambulatory Visit (INDEPENDENT_AMBULATORY_CARE_PROVIDER_SITE_OTHER): Payer: Managed Care, Other (non HMO) | Admitting: Sports Medicine

## 2017-07-07 ENCOUNTER — Ambulatory Visit (INDEPENDENT_AMBULATORY_CARE_PROVIDER_SITE_OTHER): Payer: Managed Care, Other (non HMO)

## 2017-07-07 ENCOUNTER — Ambulatory Visit: Payer: Managed Care, Other (non HMO)

## 2017-07-07 VITALS — BP 118/76 | HR 74 | Resp 16

## 2017-07-07 DIAGNOSIS — M25571 Pain in right ankle and joints of right foot: Secondary | ICD-10-CM | POA: Diagnosis not present

## 2017-07-07 DIAGNOSIS — M79671 Pain in right foot: Secondary | ICD-10-CM | POA: Diagnosis not present

## 2017-07-07 DIAGNOSIS — M722 Plantar fascial fibromatosis: Secondary | ICD-10-CM

## 2017-07-07 DIAGNOSIS — M79672 Pain in left foot: Secondary | ICD-10-CM

## 2017-07-07 DIAGNOSIS — M25572 Pain in left ankle and joints of left foot: Secondary | ICD-10-CM

## 2017-07-07 MED ORDER — MELOXICAM 15 MG PO TABS: 15.0000 mg | ORAL_TABLET | Freq: Every day | ORAL | 0 refills | Status: DC

## 2017-07-07 MED ORDER — METHYLPREDNISOLONE 4 MG PO TBPK: ORAL_TABLET | ORAL | 0 refills | Status: DC

## 2017-07-07 NOTE — Progress Notes (Signed)
   Subjective:    Patient ID: Gloria Hall, female    DOB: 05-30-1971, 46 y.o.   MRN: [id number]  HPI    Review of Systems  All other systems reviewed and are negative.      Objective:   Physical Exam        Assessment & Plan:

## 2017-07-07 NOTE — Progress Notes (Signed)
Subjective: Gloria Hall is a 46 y.o. female patient presents to office with complaint of heel pain on the right>left and ankle x 3 years. 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 Works as a Haematologist hurt all the time. Patient has treated this problem with compression socks, tylenol, changing shoes with a little relief. Reports pain is all over ankle at top and side of foot as well. Denies known injury or trauma. Denies any other pedal complaints.   Review of Systems  All other systems reviewed and are negative   Patient Active Problem List   Diagnosis Date Noted  . Perimenopausal vasomotor symptoms 05/01/2016  . DUB (dysfunctional uterine bleeding) 07/19/2014  . Bilateral carpal tunnel syndrome 04/12/2014  . Facial cellulitis 05/03/2012  . Anxiety 05/03/2012  . LSIL (low grade squamous intraepithelial lesion) on Pap smear 08/21/2011    Current Outpatient Medications on File Prior to Visit  Medication Sig Dispense Refill  . Acetaminophen (TYLENOL ARTHRITIS PAIN PO) Take 1 tablet by mouth as needed.    Marland Kitchen albuterol (PROVENTIL HFA;VENTOLIN HFA) 108 (90 BASE) MCG/ACT inhaler Inhale 1-2 puffs into the lungs every 6 (six) hours as needed for wheezing or shortness of breath.    Marland Kitchen amitriptyline (ELAVIL) 50 MG tablet Take 50 mg by mouth at bedtime.    Marland Kitchen atenolol (TENORMIN) 25 MG tablet Take 25 mg by mouth daily.    . Calcium Carb-Cholecalciferol (CALCIUM 600 + D PO) Take 1 tablet by mouth daily.    . Cholecalciferol (VITAMIN D) 2000 units CAPS Take 1 capsule by mouth daily.    . cyclobenzaprine (FLEXERIL) 10 MG tablet Take 1 tablet (10 mg total) by mouth 3 (three) times daily as needed for muscle spasms. 30 tablet 2  . dicyclomine (BENTYL) 10 MG capsule Take 10 mg by mouth 2 (two) times daily.    Marland Kitchen gabapentin (NEURONTIN) 300 MG capsule Take 300 mg by mouth 2 (two) times daily.    Marland Kitchen HYDROcodone-acetaminophen (NORCO/VICODIN) 5-325 MG tablet Take 1 tablet by  mouth 2 (two) times daily as needed for moderate pain.    . Magnesium 500 MG CAPS Take 1 capsule by mouth daily.    . medroxyPROGESTERone (PROVERA) 10 MG tablet Take 2 tablets (20 mg total) by mouth daily. 30 tablet 2  . naproxen (NAPROSYN) 500 MG tablet Take 500 mg by mouth 2 (two) times daily with a meal.    . omeprazole (PRILOSEC) 20 MG capsule Take 1 capsule (20 mg total) by mouth daily. 90 capsule 3  . permethrin (ELIMITE) 5 % cream Apply from the neck down at night and wash off in the morning once 180 g 1  . rizatriptan (MAXALT-MLT) 10 MG disintegrating tablet Take 1 tablet (10 mg total) by mouth as needed for migraine. May repeat in 2 hours if needed 9 tablet 3  . topiramate (TOPAMAX) 50 MG tablet Take 1 tablet (50 mg total) by mouth 2 (two) times daily. 60 tablet 6  . triamcinolone cream (KENALOG) 0.1 % Apply 1 application topically 2 (two) times daily. 30 g 0  . venlafaxine (EFFEXOR) 75 MG tablet Take 75 mg by mouth 2 (two) times daily.    . vitamin E (VITAMIN E) 400 UNIT capsule Take 400 Units by mouth daily.     No current facility-administered medications on file prior to visit.     Allergies  Allergen Reactions  . Imitrex [Sumatriptan] Other (See Comments)    Throat tightness x 15 minutes;  no swelling or itching    Objective: Physical Exam General: The patient is alert and oriented x3 in no acute distress.  Dermatology: Skin is warm, dry and supple bilateral lower extremities. Nails 1-10 are normal. There is no erythema, edema, no eccymosis, no open lesions present. Integument is otherwise unremarkable.  Vascular: Dorsalis Pedis pulse and Posterior Tibial pulse are 2/4 bilateral. Capillary fill time is immediate to all digits.  Neurological: Grossly intact to light touch with an achilles reflex of +2/5 and a  negative Tinel's sign bilateral.  Musculoskeletal: Tenderness to palpation at the medial calcaneal tubercale and through the insertion of the plantar fascia on the  left and right foot, worse on right. No pain with compression of calcaneus bilateral. No pain with tuning fork to calcaneus bilateral. + Pain to anterior and lateral foot and ankles R>L. No pain with calf compression bilateral. There is decreased Ankle joint range of motion bilateral. All other joints range of motion within normal limits bilateral. Strength 5/5 in all groups bilateral.   Gait: Unassisted, Antalgic   Xray, Right/Left foot:  Normal osseous mineralization. Joint spaces preserved except at ankles where there is mild joint space narrowing at lateral gutters. No fracture/dislocation/boney destruction. Calcaneal spur present with mild thickening of plantar fascia, R>L. No other soft tissue abnormalities or radiopaque foreign bodies.   Assessment and Plan: Problem List Items Addressed This Visit    None    Visit Diagnoses    Plantar fasciitis    -  Primary   Acute right ankle pain       Acute left ankle pain       Foot pain, bilateral          -Complete examination performed.  -Xrays reviewed -Discussed with patient in detail the condition of plantar fasciitis with compensation ankle pain, how this occurs and general treatment options. Explained both conservative and surgical treatments.  -No injection today, hypersensitive but advised patient if pain is not any better next visit will proceed with injection  -Rx Meloxicam to start after Medrol dose pack is completed -Recommended good supportive shoes and advised use of OTC insert. Explained to patient that if these orthoses work well, we will continue with these. If these do not improve her condition and  pain, we will consider custom molded orthoses. - Explained in detail the use of the fascial brace bilateral which was dispensed at today's visit. -Explained and dispensed to patient daily stretching exercises. -Recommend patient to ice affected area 1-2x daily. -Patient to return to office in 3-4 weeks for follow up or sooner if  problems or questions arise.  Landis Martins, DPM

## 2017-07-07 NOTE — Patient Instructions (Signed)

## 2017-07-18 ENCOUNTER — Ambulatory Visit (HOSPITAL_COMMUNITY)
Admission: EM | Admit: 2017-07-18 | Discharge: 2017-07-18 | Disposition: A | Discharge status: Home / Self Care | Payer: Managed Care, Other (non HMO) | Attending: Family Medicine | Admitting: Family Medicine

## 2017-07-18 ENCOUNTER — Encounter (HOSPITAL_COMMUNITY): Payer: Self-pay | Admitting: Emergency Medicine

## 2017-07-18 DIAGNOSIS — K0889 Other specified disorders of teeth and supporting structures: Secondary | ICD-10-CM

## 2017-07-18 MED ORDER — HYDROCODONE-ACETAMINOPHEN 5-325 MG PO TABS: 1.0000 | ORAL_TABLET | Freq: Four times a day (QID) | ORAL | 0 refills | Status: DC | PRN

## 2017-07-18 MED ORDER — AMOXICILLIN-POT CLAVULANATE 875-125 MG PO TABS: 1.0000 | ORAL_TABLET | Freq: Two times a day (BID) | ORAL | 0 refills | Status: DC

## 2017-07-18 NOTE — ED Provider Notes (Signed)
[place]   [id number] 07/18/17 Arrival Time: 1219  ASSESSMENT & PLAN:  1. Pain, dental     Meds ordered this encounter  Medications  . HYDROcodone-acetaminophen (NORCO/VICODIN) 5-325 MG tablet    Sig: Take 1 tablet by mouth every 6 (six) hours as needed for moderate pain or severe pain.    Dispense:  4 tablet    Refill:  0  . amoxicillin-clavulanate (AUGMENTIN) 875-125 MG tablet    Sig: Take 1 tablet by mouth every 12 (twelve) hours.    Dispense:  14 tablet    Refill:  0    [place] Controlled Substances Registry consulted for this patient. I feel the risk/benefit ratio today is favorable for proceeding with this prescription for a controlled substance. Medication sedation precautions given.  Dental resource written instructions given. She will schedule dental evaluation as soon as possible.  Reviewed expectations re: course of current medical issues. Questions answered. Outlined signs and symptoms indicating need for more acute intervention. Patient verbalized understanding. After Visit Summary given.   SUBJECTIVE:  Gloria Hall is a 46 y.o. female who reports gradual onset of right lower dental pain describes as an ache. Present for 3-4 days. Afebrile. Becoming more painful with chewing. Does not see a dentist regularly. OTC analgesics without relief. No neck swelling or pain.  ROS: As per HPI.  OBJECTIVE:  Vitals:   07/18/17 1237  BP: (!) 153/86  Pulse: 82  Resp: 18  Temp: 98.7 F (37.1 C)  TempSrc: Oral  SpO2: 96%    General appearance: alert; no distress HENT: normocephalic; atraumatic; dentition: fair; gingival hypertrophy over R lower front gum; no fluctuance but tender to palpation Neck: supple without LAD Lungs: normal respirations Skin: warm and dry Psychological: alert and cooperative; normal mood and affect   Allergies  Allergen Reactions  . Imitrex [Sumatriptan] Other (See Comments)    Throat tightness x 15 minutes; no swelling or  itching    Past Medical History:  Diagnosis Date  . Anemia   . Anxiety   . Arthritis   . Back pain, chronic   . Bronchitis   . Hypertension   . IBS (irritable bowel syndrome)   . LSIL (low grade squamous intraepithelial lesion) on Pap smear 08/21/2011  . Migraines   . Peripheral neuropathic pain    in hands   Social History   Socioeconomic History  . Marital status: Married    Spouse name: Mozell Haber  . Number of children: 3  . Years of education: 12th  . Highest education level: Not on file  Social Needs  . Financial resource strain: Not on file  . Food insecurity - worry: Not on file  . Food insecurity - inability: Not on file  . Transportation needs - medical: Not on file  . Transportation needs - non-medical: Not on file  Occupational History    Employer: [employer]    Comment: [employer]  Tobacco Use  . Smoking status: Former Smoker    Packs/day: 2.00    Years: 10.00    Pack years: 20.00    Types: Cigarettes    Last attempt to quit: 05/03/2009    Years since quitting: 8.2  . Smokeless tobacco: Never Used  Substance and Sexual Activity  . Alcohol use: No    Comment: quit in 2008  . 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   Marland Kitchen  Sexual activity: Yes    Birth control/protection: Surgical  Other Topics Concern  . Not on file  Social History Narrative   Patient lives at home with spouse.   Caffeine Use: 2 cups of coffee and 2 cups of tea daily   Family History  Problem Relation Age of Onset  . Diabetes Mother   . Hypertension Mother   . Hyperlipidemia Mother   . Fibromyalgia Mother   . Sleep apnea Mother   . Migraines Mother   . Heart attack Father   . Diabetes Maternal Aunt   . Cancer Maternal Aunt        breast, metastatic cancer throughout body  . Breast cancer Maternal Aunt   . Crohn's disease Maternal Aunt   . Cancer Maternal Uncle        leukemia  .  Diabetes Maternal Grandmother   . Cancer Maternal Uncle        colon   Past Surgical History:  Procedure Laterality Date  . DILATION AND CURETTAGE OF UTERUS    . dilation and curretage     after a miscarriage  . ENDOMETRIAL ABLATION    . KNEE ARTHROSCOPY Left 12-01-13  . LAPAROSCOPY  03/19/2011   Procedure: LAPAROSCOPY OPERATIVE;  Surgeon: Juliene Pina C. Hulan Fray, MD;  Location: [facility];  Service: Gynecology;  Laterality: N/A;  . NOVASURE ABLATION  03/19/2011   Procedure: NOVASURE ABLATION;  Surgeon: Juliene Pina C. Hulan Fray, MD;  Location: [facility];  Service: Gynecology;  Laterality: N/A;  . TUBAL LIGATION  1993     Vanessa Kick, MD 07/18/17 [phone]

## 2017-07-18 NOTE — ED Triage Notes (Signed)
Pt sts lower dental pain with some swelling

## 2017-07-18 NOTE — Discharge Instructions (Signed)
Be aware, pain medications may cause drowsiness. Please do not drive, operate heavy machinery or make important decisions while on this medication, it can cloud your judgement.  

## 2017-08-04 ENCOUNTER — Emergency Department (HOSPITAL_COMMUNITY)
Admission: EM | Admit: 2017-08-04 | Discharge: 2017-08-05 | Disposition: A | Discharge status: Home / Self Care | Payer: Managed Care, Other (non HMO) | Attending: Emergency Medicine | Admitting: Emergency Medicine

## 2017-08-04 ENCOUNTER — Other Ambulatory Visit: Payer: Self-pay

## 2017-08-04 ENCOUNTER — Encounter (HOSPITAL_COMMUNITY): Payer: Self-pay | Admitting: *Deleted

## 2017-08-04 ENCOUNTER — Emergency Department (HOSPITAL_COMMUNITY): Payer: Managed Care, Other (non HMO)

## 2017-08-04 ENCOUNTER — Encounter (HOSPITAL_COMMUNITY): Payer: Self-pay

## 2017-08-04 ENCOUNTER — Emergency Department (HOSPITAL_COMMUNITY)
Admission: EM | Admit: 2017-08-04 | Discharge: 2017-08-04 | Discharge status: Against Medical Advice | Payer: Managed Care, Other (non HMO)

## 2017-08-04 ENCOUNTER — Ambulatory Visit (HOSPITAL_COMMUNITY)
Admission: EM | Admit: 2017-08-04 | Discharge: 2017-08-04 | Disposition: A | Discharge status: Home / Self Care | Payer: Managed Care, Other (non HMO) | Source: Home / Self Care | Attending: Family Medicine | Admitting: Family Medicine

## 2017-08-04 DIAGNOSIS — F17210 Nicotine dependence, cigarettes, uncomplicated: Secondary | ICD-10-CM | POA: Insufficient documentation

## 2017-08-04 DIAGNOSIS — K1120 Sialoadenitis, unspecified: Secondary | ICD-10-CM | POA: Diagnosis not present

## 2017-08-04 DIAGNOSIS — K0889 Other specified disorders of teeth and supporting structures: Secondary | ICD-10-CM | POA: Diagnosis present

## 2017-08-04 DIAGNOSIS — Z79899 Other long term (current) drug therapy: Secondary | ICD-10-CM | POA: Diagnosis not present

## 2017-08-04 DIAGNOSIS — I10 Essential (primary) hypertension: Secondary | ICD-10-CM | POA: Diagnosis not present

## 2017-08-04 DIAGNOSIS — L03211 Cellulitis of face: Secondary | ICD-10-CM

## 2017-08-04 DIAGNOSIS — K047 Periapical abscess without sinus: Secondary | ICD-10-CM

## 2017-08-04 LAB — CBC WITH DIFFERENTIAL/PLATELET
BASOS ABS: 0 10*3/uL (ref 0.0–0.1)
Basophils Relative: 0 %
EOS ABS: 0.2 10*3/uL (ref 0.0–0.7)
EOS PCT: 2 %
HCT: 37 % (ref 36.0–46.0)
HEMOGLOBIN: 11.5 g/dL — AB (ref 12.0–15.0)
LYMPHS PCT: 20 %
Lymphs Abs: 2.5 10*3/uL (ref 0.7–4.0)
MCH: 25.2 pg — AB (ref 26.0–34.0)
MCHC: 31.1 g/dL (ref 30.0–36.0)
MCV: 81 fL (ref 78.0–100.0)
Monocytes Absolute: 0.8 10*3/uL (ref 0.1–1.0)
Monocytes Relative: 7 %
NEUTROS PCT: 71 %
Neutro Abs: 9.3 10*3/uL — ABNORMAL HIGH (ref 1.7–7.7)
PLATELETS: 331 10*3/uL (ref 150–400)
RBC: 4.57 MIL/uL (ref 3.87–5.11)
RDW: 14.3 % (ref 11.5–15.5)
WBC: 12.9 10*3/uL — AB (ref 4.0–10.5)

## 2017-08-04 LAB — BASIC METABOLIC PANEL
ANION GAP: 8 (ref 5–15)
BUN: 11 mg/dL (ref 6–20)
CHLORIDE: 105 mmol/L (ref 101–111)
CO2: 25 mmol/L (ref 22–32)
Calcium: 8.9 mg/dL (ref 8.9–10.3)
Creatinine, Ser: 0.63 mg/dL (ref 0.44–1.00)
GFR calc Af Amer: >60 mL/min (ref >60)
Glucose, Bld: 97 mg/dL (ref 65–99)
Potassium: 3.5 mmol/L (ref 3.5–5.1)
SODIUM: 138 mmol/L (ref 135–145)

## 2017-08-04 MED ORDER — MORPHINE SULFATE (PF) 4 MG/ML IV SOLN
4.0000 mg | Freq: Once | INTRAVENOUS | Status: AC
  Administered 2017-08-04: 4 mg via INTRAVENOUS
  Filled 2017-08-04: qty 1

## 2017-08-04 MED ORDER — CLINDAMYCIN PHOSPHATE 600 MG/50ML IV SOLN
600.0000 mg | Freq: Once | INTRAVENOUS | Status: AC
  Administered 2017-08-04: 600 mg via INTRAVENOUS
  Filled 2017-08-04: qty 50

## 2017-08-04 MED ORDER — KETOROLAC TROMETHAMINE 30 MG/ML IJ SOLN
30.0000 mg | Freq: Once | INTRAMUSCULAR | Status: AC
  Administered 2017-08-04: 30 mg via INTRAVENOUS
  Filled 2017-08-04: qty 1

## 2017-08-04 MED ORDER — IBUPROFEN 800 MG PO TABS: 800.0000 mg | ORAL_TABLET | Freq: Three times a day (TID) | ORAL | 0 refills | Status: DC

## 2017-08-04 MED ORDER — IOPAMIDOL (ISOVUE-300) INJECTION 61%
75.0000 mL | Freq: Once | INTRAVENOUS | Status: AC | PRN
  Administered 2017-08-04: 75 mL via INTRAVENOUS

## 2017-08-04 MED ORDER — IOPAMIDOL (ISOVUE-300) INJECTION 61%
INTRAVENOUS | Status: AC
  Filled 2017-08-04: qty 75

## 2017-08-04 MED ORDER — BUPIVACAINE HCL (PF) 0.5 % IJ SOLN
10.0000 mL | Freq: Once | INTRAMUSCULAR | Status: AC
  Administered 2017-08-04: 10 mL
  Filled 2017-08-04: qty 30

## 2017-08-04 MED ORDER — METHYLPREDNISOLONE SODIUM SUCC 125 MG IJ SOLR
125.0000 mg | Freq: Once | INTRAMUSCULAR | Status: AC
  Administered 2017-08-04: 125 mg via INTRAVENOUS
  Filled 2017-08-04: qty 2

## 2017-08-04 MED ORDER — AMOXICILLIN-POT CLAVULANATE 875-125 MG PO TABS: 1.0000 | ORAL_TABLET | Freq: Two times a day (BID) | ORAL | 0 refills | Status: DC

## 2017-08-04 NOTE — ED Provider Notes (Signed)
[facility] DEPT Provider Note   CSN: [id number] Arrival date & time: 08/04/17  1557     History   Chief Complaint Chief Complaint  Patient presents with  . Angioedema    HPI Gloria Hall is a 47 y.o. female.  HPI Patient was seen on 12/15 for right lower dental pain.  Had gotten increasingly painful and swollen.  Patient was given a prescription for Augmentin.  He took the Augmentin, patient reports she was seeing some improvement but now over the past 2 days the swelling really increased a lot and became more painful.  Pain now radiates up to her ear and the side of her neck.  No fever or chills.  No cough. Past Medical History:  Diagnosis Date  . Anemia   . Anxiety   . Arthritis   . Back pain, chronic   . Bronchitis   . Hypertension   . IBS (irritable bowel syndrome)   . LSIL (low grade squamous intraepithelial lesion) on Pap smear 08/21/2011  . Migraines   . Peripheral neuropathic pain    in hands    Patient Active Problem List   Diagnosis Date Noted  . Perimenopausal vasomotor symptoms 05/01/2016  . DUB (dysfunctional uterine bleeding) 07/19/2014  . Bilateral carpal tunnel syndrome 04/12/2014  . Facial cellulitis 05/03/2012  . Anxiety 05/03/2012  . LSIL (low grade squamous intraepithelial lesion) on Pap smear 08/21/2011    Past Surgical History:  Procedure Laterality Date  . DILATION AND CURETTAGE OF UTERUS    . dilation and curretage     after a miscarriage  . ENDOMETRIAL ABLATION    . KNEE ARTHROSCOPY Left 12-01-13  . LAPAROSCOPY  03/19/2011   Procedure: LAPAROSCOPY OPERATIVE;  Surgeon: Juliene Pina C. Hulan Fray, MD;  Location: [facility];  Service: Gynecology;  Laterality: N/A;  . NOVASURE ABLATION  03/19/2011   Procedure: NOVASURE ABLATION;  Surgeon: Juliene Pina C. Hulan Fray, MD;  Location: [facility];  Service: Gynecology;  Laterality: N/A;  . TUBAL LIGATION  1993    OB History    Gravida Para Term Preterm AB Living   4 3 3   1 3    SAB TAB Ectopic  Multiple Live Births   1               Home Medications    Prior to Admission medications   Medication Sig Start Date End Date Taking? Authorizing Provider  Acetaminophen (TYLENOL ARTHRITIS PAIN PO) Take 1 tablet by mouth as needed.   Yes [provider]  amoxicillin-clavulanate (AUGMENTIN) 875-125 MG tablet Take 1 tablet by mouth every 12 (twelve) hours. 08/04/17  Yes Hagler, Aaron Edelman, MD  Calcium Carb-Cholecalciferol (CALCIUM 600 + D PO) Take 1 tablet by mouth daily.   Yes [provider]  Cholecalciferol (VITAMIN D) 2000 units CAPS Take 1 capsule by mouth daily.   Yes [provider]  ibuprofen (ADVIL,MOTRIN) 800 MG tablet Take 1 tablet (800 mg total) by mouth 3 (three) times daily. 08/04/17  Yes Hagler, Aaron Edelman, MD  Magnesium 500 MG CAPS Take 1 capsule by mouth daily.   Yes [provider]  omeprazole (PRILOSEC) 20 MG capsule Take 1 capsule (20 mg total) by mouth daily. 04/14/14  Yes Lance Bosch, NP  vitamin E (VITAMIN E) 400 UNIT capsule Take 400 Units by mouth daily.   Yes [provider]  clindamycin (CLEOCIN) 300 MG capsule Take 1 capsule (300 mg total) by mouth 4 (four) times daily. X 7 days 08/05/17  Charlesetta Shanks, MD  HYDROcodone-acetaminophen (NORCO/VICODIN) 5-325 MG tablet Take 1 tablet by mouth every 6 (six) hours as needed for moderate pain or severe pain. Patient not taking: Reported on 08/04/2017 07/18/17   Vanessa Kick, MD  ibuprofen (ADVIL,MOTRIN) 600 MG tablet Take 1 tablet (600 mg total) by mouth every 6 (six) hours as needed. 08/05/17   Charlesetta Shanks, MD  meloxicam (MOBIC) 15 MG tablet Take 1 tablet (15 mg total) by mouth daily. Patient not taking: Reported on 08/04/2017 07/07/17   Landis Martins, DPM  methylPREDNISolone (MEDROL DOSEPAK) 4 MG TBPK tablet Take 1st as instructed Patient not taking: Reported on 08/04/2017 07/07/17   Landis Martins, DPM  oxyCODONE-acetaminophen (PERCOCET) 5-325 MG tablet Take 2 tablets by mouth every 4  (four) hours as needed. 08/05/17   Charlesetta Shanks, MD  permethrin (ELIMITE) 5 % cream Apply from the neck down at night and wash off in the morning once Patient not taking: Reported on 08/04/2017 06/10/14   Gregor Hams, MD    Family History Family History  Problem Relation Age of Onset  . Diabetes Mother   . Hypertension Mother   . Hyperlipidemia Mother   . Fibromyalgia Mother   . Sleep apnea Mother   . Migraines Mother   . Heart attack Father   . Diabetes Maternal Aunt   . Cancer Maternal Aunt        breast, metastatic cancer throughout body  . Breast cancer Maternal Aunt   . Crohn's disease Maternal Aunt   . Cancer Maternal Uncle        leukemia  . Diabetes Maternal Grandmother   . Cancer Maternal Uncle        colon    Social History Social History   Tobacco Use  . Smoking status: Former Smoker    Packs/day: 2.00    Years: 10.00    Pack years: 20.00    Types: Cigarettes    Last attempt to quit: 05/03/2009    Years since quitting: 8.2  . Smokeless tobacco: Never Used  Substance Use Topics  . Alcohol use: No    Comment: quit in 2008  . 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      Allergies   Imitrex [sumatriptan]   Review of Systems Review of Systems 10 Systems reviewed and are negative for acute change except as noted in the HPI.   Physical Exam Updated Vital Signs BP (!) 144/98 (BP Location: Right Arm)   Pulse 87   Temp 98.2 F (36.8 C) (Oral)   Resp 16   LMP 07/04/2014   SpO2 100%   Physical Exam  Constitutional: She is oriented to person, place, and time.  Patient is alert and appropriate.  She is nontoxic.  Large facial swelling.  No respiratory distress.  HENT:  Patient has very large swelling of the lower lip to the right side.  The lip is edematous.  She has firm swelling of the mentum and the submental area to the right side.  Tender lymphadenopathy. Patient has tenderness to percussion of the  first maxillary molar. Airway is widely patent.  No difficulty swallowing or breathing.  No Stridor.  Eyes: EOM are normal.  Neck:  No meningismus.  Patient does have swelling that extends to the lateral right neck.  Cardiovascular: Normal rate, regular rhythm and normal heart sounds.  Pulmonary/Chest: Effort normal.  Musculoskeletal: Normal range of motion.  Neurological: She is  alert and oriented to person, place, and time. No cranial nerve deficit. She exhibits normal muscle tone. Coordination normal.  Skin: Skin is warm and dry.  Psychiatric: She has a normal mood and affect.  Images are taken after incision and drainage, there does appear to be improvement relative to first examination.       ED Treatments / Results  Labs (all labs ordered are listed, but only abnormal results are displayed) Labs Reviewed  CBC WITH DIFFERENTIAL/PLATELET - Abnormal; Notable for the following components:      Result Value   WBC 12.9 (*)    Hemoglobin 11.5 (*)    MCH 25.2 (*)    Neutro Abs 9.3 (*)    All other components within normal limits  BASIC METABOLIC PANEL    EKG  EKG Interpretation None       Radiology Ct Maxillofacial W Contrast  Result Date: 08/04/2017 CLINICAL DATA:  Mass, lump or swelling of the face. Right-sided dental pain. Right lip and face swelling. EXAM: CT MAXILLOFACIAL WITH CONTRAST TECHNIQUE: Multidetector CT imaging of the maxillofacial structures was performed with intravenous contrast. Multiplanar CT image reconstructions were also generated. CONTRAST:  26mL ISOVUE-300 IOPAMIDOL (ISOVUE-300) INJECTION 61% COMPARISON:  None. FINDINGS: Osseous: A dental caries is present within the first right maxillary premolar tooth (tooth #28) there is marked periapical lucency about the tooth root with disruption of the anterior cortex. There is also lucency about the root of the left maxillary canine tooth (tooth #11). Facial bones are otherwise intact. The mandible is otherwise  intact and located. Orbits: The globes and orbits are within normal limits. Sinuses: The paranasal sinuses and mastoid air cells are clear. Soft tissues: Marked soft tissue swelling is present about the right-sided mandible. There is focal scratched at there is a low-density collection adjacent to the right side of the mandible at the involved tooth measuring up to 14 mm compatible with a subperiosteal abscess. Extensive soft tissue swelling extends to the skin surface with asymmetric thickening of the right-sided platysma. Right greater than left submandibular lymph nodes are likely reactive. Right greater than left level 2 lymph nodes are also present. Limited intracranial: Unremarkable. IMPRESSION: 1. Dental caries and periapical abscess involving the first right maxillary premolar tooth with adjacent subperiosteal abscess and marked soft tissue inflammation. 2. Focal periapical lucencies and dental caries involving the left maxillary canine tooth without associated inflammatory change. Electronically Signed   By: San Morelle M.D.   On: 08/04/2017 20:24    Procedures .Marland KitchenIncision and Drainage Date/Time: 08/05/2017 12:12 AM Performed by: Charlesetta Shanks, MD Authorized by: Charlesetta Shanks, MD   Consent:    Consent obtained:  Verbal   Consent given by:  Patient   Risks discussed:  Bleeding, incomplete drainage, pain and infection   Alternatives discussed:  No treatment, delayed treatment, observation and referral Location:    Type:  Abscess   Location:  Mouth   Mouth location:  Alveolar process Anesthesia (see MAR for exact dosages):    Anesthesia method:  Nerve block   Block anesthetic:  Bupivacaine 0.5% w/o epi   Block technique:  Dental   Block injection procedure:  Anatomic landmarks identified, anatomic landmarks palpated, introduced needle, incremental injection and negative aspiration for blood   Block outcome:  Anesthesia achieved Procedure type:    Complexity:   Complex Procedure details:    Needle aspiration: yes     Needle size:  18 G   Incision types:  Single straight   Incision depth:  Submucosal   Scalpel blade:  11   Wound management:  Irrigated with saline   Drainage:  Purulent   Drainage amount:  Copious   Wound treatment:  Wound left open Post-procedure details:    Patient tolerance of procedure:  Tolerated well, no immediate complications Comments:     After dental block accomplished, 18-gauge used to aspirate for pus.  Pus identified.  Subsequently 11 blade used to make incision.  Copious amount of purulent material expressed.  This was irrigated with normal saline.  Patient tolerated well.    (including critical care time)  Medications Ordered in ED Medications  iopamidol (ISOVUE-300) 61 % injection (not administered)  clindamycin (CLEOCIN) IVPB 600 mg (0 mg Intravenous Stopped 08/04/17 1932)  bupivacaine (MARCAINE) 0.5 % injection 10 mL (10 mLs Infiltration Given by Other 08/04/17 1838)  ketorolac (TORADOL) 30 MG/ML injection 30 mg (30 mg Intravenous Given 08/04/17 1841)  morphine 4 MG/ML injection 4 mg (4 mg Intravenous Given 08/04/17 1839)  iopamidol (ISOVUE-300) 61 % injection 75 mL (75 mLs Intravenous Contrast Given 08/04/17 1946)  morphine 4 MG/ML injection 4 mg (4 mg Intravenous Given 08/04/17 2147)  methylPREDNISolone sodium succinate (SOLU-MEDROL) 125 mg/2 mL injection 125 mg (125 mg Intravenous Given 08/04/17 2352)     Initial Impression / Assessment and Plan / ED Course  I have reviewed the triage vital signs and the nursing notes.  Pertinent labs & imaging results that were available during my care of the patient were reviewed by me and considered in my medical decision making (see chart for details).    Consult: Reviewed with ENT at [facility] Dr. Joellen Jersey.  He advised that as the abscess has already been drained by me, they would not likely do additional interventional treatment tonight.  Patient would be assessed for need for  admission for facial cellulitis in association with abscess.  He advises they are happy to see the patient and evaluate at [facility], the patient would first go to the emergency department for evaluation and determination for admission status.  He agrees with clindamycin as antibiotic of choice.  I reviewed with the patient the plan of going to the emergency department at [facility] for evaluation for admission for facial cellulitis in association with abscess.  At this time, given as that the abscess has been drained, she advises she would prefer to try continuing with the oral clindamycin and obviously return immediately if the swelling is not improving, she develops fever or other concerning symptoms.  Patient did objectively seem to be improving in terms of amount of swelling with reassessment about an hour after drainage.  Final Clinical Impressions(s) / ED Diagnoses   Final diagnoses:  Apical abscess  Facial cellulitis    ED Discharge Orders        Ordered    clindamycin (CLEOCIN) 300 MG capsule  4 times daily     08/05/17 0000    oxyCODONE-acetaminophen (PERCOCET) 5-325 MG tablet  Every 4 hours PRN     08/05/17 0000    ibuprofen (ADVIL,MOTRIN) 600 MG tablet  Every 6 hours PRN     08/05/17 0000       Charlesetta Shanks, MD 08/05/17 0104

## 2017-08-04 NOTE — ED Notes (Signed)
Bed: WA03 Expected date:  Expected time:  Means of arrival:  Comments: TR 4

## 2017-08-04 NOTE — ED Provider Notes (Signed)
[place]   [id number] 08/04/17 Arrival Time: 0957  ASSESSMENT & PLAN:  1. Sialoadenitis     Meds ordered this encounter  Medications  . amoxicillin-clavulanate (AUGMENTIN) 875-125 MG tablet    Sig: Take 1 tablet by mouth every 12 (twelve) hours.    Dispense:  20 tablet    Refill:  0  . ibuprofen (ADVIL,MOTRIN) 800 MG tablet    Sig: Take 1 tablet (800 mg total) by mouth 3 (three) times daily.    Dispense:  21 tablet    Refill:  0   She will f/u if not seeing improvement over the next 48 hours, sooner if needed. Reviewed expectations re: course of current medical issues. Questions answered. Outlined signs and symptoms indicating need for more acute intervention. Patient verbalized understanding. After Visit Summary given.   SUBJECTIVE:  Gloria Hall is a 47 y.o. female who reports abrupt onset of right lower "jaw swelling". Just noticed this morning. Afebrile. Tolerating PO intake but reports pain with chewing. She does not see a dentist regularly. OTC analgesics without relief. No specific pain in gums or teeth.  ROS: As per HPI.  OBJECTIVE:  Vitals:   08/04/17 1019  BP: (!) 152/95  Pulse: 81  Temp: 98.1 F (36.7 C)  TempSrc: Oral  SpO2: 100%    General appearance: alert; no distress HENT: normocephalic; atraumatic; dentition: fair, no bleeding or drainage; R mandible with swelling and tenderness to palpation Neck: supple without LAD Lungs: normal respirations Skin: warm and dry Psychological: alert and cooperative; normal mood and affect   Allergies  Allergen Reactions  . Imitrex [Sumatriptan] Other (See Comments)    Throat tightness x 15 minutes; no swelling or itching    Past Medical History:  Diagnosis Date  . Anemia   . Anxiety   . Arthritis   . Back pain, chronic   . Bronchitis   . Hypertension   . IBS (irritable bowel syndrome)   . LSIL (low grade squamous intraepithelial lesion) on Pap smear 08/21/2011  . Migraines   .  Peripheral neuropathic pain    in hands   Social History   Socioeconomic History  . Marital status: Married    Spouse name: Hooria Gasparini  . Number of children: 3  . Years of education: 12th  . Highest education level: Not on file  Social Needs  . Financial resource strain: Not on file  . Food insecurity - worry: Not on file  . Food insecurity - inability: Not on file  . Transportation needs - medical: Not on file  . Transportation needs - non-medical: Not on file  Occupational History    Employer: [employer]    Comment: [employer]  Tobacco Use  . Smoking status: Former Smoker    Packs/day: 2.00    Years: 10.00    Pack years: 20.00    Types: Cigarettes    Last attempt to quit: 05/03/2009    Years since quitting: 8.2  . Smokeless tobacco: Never Used  Substance and Sexual Activity  . Alcohol use: No    Comment: quit in 2008  . 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   Marland Kitchen Sexual activity: Yes    Birth control/protection: Surgical  Other Topics Concern  . Not on file  Social History Narrative   Patient lives at home with spouse.   Caffeine Use: 2 cups of coffee and  2 cups of tea daily   Family History  Problem Relation Age of Onset  . Diabetes Mother   . Hypertension Mother   . Hyperlipidemia Mother   . Fibromyalgia Mother   . Sleep apnea Mother   . Migraines Mother   . Heart attack Father   . Diabetes Maternal Aunt   . Cancer Maternal Aunt        breast, metastatic cancer throughout body  . Breast cancer Maternal Aunt   . Crohn's disease Maternal Aunt   . Cancer Maternal Uncle        leukemia  . Diabetes Maternal Grandmother   . Cancer Maternal Uncle        colon   Past Surgical History:  Procedure Laterality Date  . DILATION AND CURETTAGE OF UTERUS    . dilation and curretage     after a miscarriage  . ENDOMETRIAL ABLATION    . KNEE ARTHROSCOPY Left 12-01-13  .  LAPAROSCOPY  03/19/2011   Procedure: LAPAROSCOPY OPERATIVE;  Surgeon: Juliene Pina C. Hulan Fray, MD;  Location: [facility];  Service: Gynecology;  Laterality: N/A;  . NOVASURE ABLATION  03/19/2011   Procedure: NOVASURE ABLATION;  Surgeon: Juliene Pina C. Hulan Fray, MD;  Location: [facility];  Service: Gynecology;  Laterality: N/A;  . TUBAL LIGATION  1993     Vanessa Kick, MD 08/04/17 [phone]

## 2017-08-04 NOTE — ED Notes (Signed)
Pt and family requesting update, RN informed them that she had spoken to MD and MD said she would be in shortly to do procedure.

## 2017-08-04 NOTE — ED Notes (Signed)
Provided patient a ginger ale with permission from provider.

## 2017-08-04 NOTE — ED Notes (Signed)
ED Provider at bedside. 

## 2017-08-04 NOTE — ED Triage Notes (Signed)
Per pt her is having dental pain, per pt last night she had a sharp pain in her teeth on the right side, per pt she woke up this morning with her right side of her mouth, per pt pain is radiating to her right ear and she is having headaches and nauseous

## 2017-08-04 NOTE — ED Triage Notes (Addendum)
Pt c/o R sided dental pain x2 weeks. She has been seen at [facility] x2 and had 2 rounds of antibiotics. The R side of her lower lip is very swollen and has continued to progress throughout the day. No SOB or work of breathing noted. A&Ox4. Ambulatory. Pt not on lisinopril.

## 2017-08-05 MED ORDER — IBUPROFEN 600 MG PO TABS: 600.0000 mg | ORAL_TABLET | Freq: Four times a day (QID) | ORAL | 0 refills | Status: DC | PRN

## 2017-08-05 MED ORDER — CLINDAMYCIN HCL 300 MG PO CAPS: 300.0000 mg | ORAL_CAPSULE | Freq: Four times a day (QID) | ORAL | 0 refills | Status: DC

## 2017-08-05 MED ORDER — OXYCODONE-ACETAMINOPHEN 5-325 MG PO TABS: 2.0000 | ORAL_TABLET | ORAL | 0 refills | Status: DC | PRN

## 2017-08-05 NOTE — Discharge Instructions (Signed)
1.  Fill your prescriptions after you leave the emergency department.  Take your next dose of clindamycin tonight after filling the prescription.  You may take ibuprofen for pain control and Percocet as needed for additional pain control. 2.  Call your dentist first thing in the morning to be seen as soon as possible.  You need a recheck within the next 24 hours. 3.  Return to the emergency department if your swelling is worsening, you are developing fever or other concerning problems.

## 2017-08-18 ENCOUNTER — Ambulatory Visit: Payer: Managed Care, Other (non HMO) | Admitting: Sports Medicine

## 2017-12-08 ENCOUNTER — Other Ambulatory Visit: Payer: Self-pay | Admitting: Gastroenterology

## 2017-12-08 DIAGNOSIS — R1010 Upper abdominal pain, unspecified: Secondary | ICD-10-CM

## 2017-12-18 ENCOUNTER — Ambulatory Visit
Admission: RE | Admit: 2017-12-18 | Discharge: 2017-12-18 | Disposition: A | Discharge status: Home / Self Care | Payer: Managed Care, Other (non HMO) | Source: Ambulatory Visit | Attending: Gastroenterology | Admitting: Gastroenterology

## 2017-12-18 DIAGNOSIS — R1010 Upper abdominal pain, unspecified: Secondary | ICD-10-CM

## 2018-04-15 ENCOUNTER — Ambulatory Visit (HOSPITAL_COMMUNITY)
Admission: EM | Admit: 2018-04-15 | Discharge: 2018-04-15 | Disposition: A | Discharge status: Home / Self Care | Payer: Managed Care, Other (non HMO) | Attending: Family Medicine | Admitting: Family Medicine

## 2018-04-15 ENCOUNTER — Encounter (HOSPITAL_COMMUNITY): Payer: Self-pay

## 2018-04-15 DIAGNOSIS — G43009 Migraine without aura, not intractable, without status migrainosus: Secondary | ICD-10-CM

## 2018-04-15 MED ORDER — KETOROLAC TROMETHAMINE 60 MG/2ML IM SOLN
INTRAMUSCULAR | Status: AC
  Filled 2018-04-15: qty 2

## 2018-04-15 MED ORDER — DEXAMETHASONE SODIUM PHOSPHATE 10 MG/ML IJ SOLN
INTRAMUSCULAR | Status: AC
  Filled 2018-04-15: qty 1

## 2018-04-15 MED ORDER — NAPROXEN 500 MG PO TABS: 500.0000 mg | ORAL_TABLET | Freq: Two times a day (BID) | ORAL | 0 refills | Status: DC

## 2018-04-15 MED ORDER — METOCLOPRAMIDE HCL 5 MG/ML IJ SOLN
5.0000 mg | Freq: Once | INTRAMUSCULAR | Status: AC
  Administered 2018-04-15: 5 mg via INTRAMUSCULAR

## 2018-04-15 MED ORDER — DEXAMETHASONE SODIUM PHOSPHATE 10 MG/ML IJ SOLN
10.0000 mg | Freq: Once | INTRAMUSCULAR | Status: AC
  Administered 2018-04-15: 10 mg via INTRAMUSCULAR

## 2018-04-15 MED ORDER — KETOROLAC TROMETHAMINE 60 MG/2ML IM SOLN
60.0000 mg | Freq: Once | INTRAMUSCULAR | Status: AC
  Administered 2018-04-15: 60 mg via INTRAMUSCULAR

## 2018-04-15 MED ORDER — METOCLOPRAMIDE HCL 5 MG/ML IJ SOLN
INTRAMUSCULAR | Status: AC
  Filled 2018-04-15: qty 2

## 2018-04-15 NOTE — ED Triage Notes (Signed)
Pt presents with migraine for past couple of days that has been worsening; also complains of dizziness and nausea.

## 2018-04-15 NOTE — Discharge Instructions (Signed)
We gave you a shot of Toradol, Reglan and Decadron today for your headache, these should begin working in approximately 30 to 40 minutes  Please continue to use Tylenol/ibuprofen or try Naprosyn as an alternative for further headache management at home  Please try to drink plenty of fluids  Please go to emergency room if headache not improving, headache worsening, developing vision changes, one-sided weakness, difficulty speaking, worsening dizziness and lightheadedness

## 2018-04-15 NOTE — ED Provider Notes (Signed)
[place]    CSN: [id number] Arrival date & time: 04/15/18  1511     History   Chief Complaint Chief Complaint  Patient presents with  . Migraine    HPI Gloria Hall is a 47 y.o. female history of hypertension presenting today for evaluation of a headache.  Patient states that she has had a headache for the past 2 weeks.  Onset was gradual.  Headache has been persistent, but has felt it worsened today.  She has been using ibuprofen 800 which eases her discomfort.  Headache has been associated with phonophobia, photophobia as well as nausea and dizziness.  Dizziness is described as occasionally off balance, occasionally lightheaded.  Denies syncope, but occasionally has felt hot as if she is about to pass out.  Feels similar to her previous migraines.  She used to be on preventative medication with Topamax.  Denies vision changes.  HPI  Past Medical History:  Diagnosis Date  . Anemia   . Anxiety   . Arthritis   . Back pain, chronic   . Bronchitis   . Hypertension   . IBS (irritable bowel syndrome)   . LSIL (low grade squamous intraepithelial lesion) on Pap smear 08/21/2011  . Migraines   . Peripheral neuropathic pain    in hands    Patient Active Problem List   Diagnosis Date Noted  . Perimenopausal vasomotor symptoms 05/01/2016  . DUB (dysfunctional uterine bleeding) 07/19/2014  . Bilateral carpal tunnel syndrome 04/12/2014  . Facial cellulitis 05/03/2012  . Anxiety 05/03/2012  . LSIL (low grade squamous intraepithelial lesion) on Pap smear 08/21/2011    Past Surgical History:  Procedure Laterality Date  . DILATION AND CURETTAGE OF UTERUS    . dilation and curretage     after a miscarriage  . ENDOMETRIAL ABLATION    . KNEE ARTHROSCOPY Left 12-01-13  . LAPAROSCOPY  03/19/2011   Procedure: LAPAROSCOPY OPERATIVE;  Surgeon: Juliene Pina C. Hulan Fray, MD;  Location: [facility];  Service: Gynecology;  Laterality: N/A;  . NOVASURE ABLATION  03/19/2011   Procedure: NOVASURE  ABLATION;  Surgeon: Juliene Pina C. Hulan Fray, MD;  Location: [facility];  Service: Gynecology;  Laterality: N/A;  . TUBAL LIGATION  1993    OB History    Gravida  4   Para  3   Term  3   Preterm      AB  1   Living  3     SAB  1   TAB      Ectopic      Multiple      Live Births               Home Medications    Prior to Admission medications   Medication Sig Start Date End Date Taking? Authorizing Provider  Acetaminophen (TYLENOL ARTHRITIS PAIN PO) Take 1 tablet by mouth as needed.    [provider]  amoxicillin-clavulanate (AUGMENTIN) 875-125 MG tablet Take 1 tablet by mouth every 12 (twelve) hours. 08/04/17   Vanessa Kick, MD  Calcium Carb-Cholecalciferol (CALCIUM 600 + D PO) Take 1 tablet by mouth daily.    [provider]  Cholecalciferol (VITAMIN D) 2000 units CAPS Take 1 capsule by mouth daily.    [provider]  clindamycin (CLEOCIN) 300 MG capsule Take 1 capsule (300 mg total) by mouth 4 (four) times daily. X 7 days 08/05/17   Charlesetta Shanks, MD  HYDROcodone-acetaminophen (NORCO/VICODIN) 5-325 MG tablet Take 1 tablet by mouth every 6 (six) hours  as needed for moderate pain or severe pain. Patient not taking: Reported on 08/04/2017 07/18/17   Vanessa Kick, MD  ibuprofen (ADVIL,MOTRIN) 600 MG tablet Take 1 tablet (600 mg total) by mouth every 6 (six) hours as needed. 08/05/17   Charlesetta Shanks, MD  ibuprofen (ADVIL,MOTRIN) 800 MG tablet Take 1 tablet (800 mg total) by mouth 3 (three) times daily. 08/04/17   Vanessa Kick, MD  Magnesium 500 MG CAPS Take 1 capsule by mouth daily.    [provider]  meloxicam (MOBIC) 15 MG tablet Take 1 tablet (15 mg total) by mouth daily. Patient not taking: Reported on 08/04/2017 07/07/17   Landis Martins, DPM  methylPREDNISolone (MEDROL DOSEPAK) 4 MG TBPK tablet Take 1st as instructed Patient not taking: Reported on 08/04/2017 07/07/17   Landis Martins, DPM  naproxen (NAPROSYN) 500 MG tablet Take 1 tablet (500 mg  total) by mouth 2 (two) times daily. 04/15/18   Wieters, Hallie C, PA-C  omeprazole (PRILOSEC) 20 MG capsule Take 1 capsule (20 mg total) by mouth daily. 04/14/14   Lance Bosch, NP  oxyCODONE-acetaminophen (PERCOCET) 5-325 MG tablet Take 2 tablets by mouth every 4 (four) hours as needed. 08/05/17   Charlesetta Shanks, MD  permethrin (ELIMITE) 5 % cream Apply from the neck down at night and wash off in the morning once Patient not taking: Reported on 08/04/2017 06/10/14   Gregor Hams, MD  vitamin E (VITAMIN E) 400 UNIT capsule Take 400 Units by mouth daily.    [provider]    Family History Family History  Problem Relation Age of Onset  . Diabetes Mother   . Hypertension Mother   . Hyperlipidemia Mother   . Fibromyalgia Mother   . Sleep apnea Mother   . Migraines Mother   . Heart attack Father   . Diabetes Maternal Aunt   . Cancer Maternal Aunt        breast, metastatic cancer throughout body  . Breast cancer Maternal Aunt   . Crohn's disease Maternal Aunt   . Cancer Maternal Uncle        leukemia  . Diabetes Maternal Grandmother   . Cancer Maternal Uncle        colon    Social History Social History   Tobacco Use  . Smoking status: Former Smoker    Packs/day: 2.00    Years: 10.00    Pack years: 20.00    Types: Cigarettes    Last attempt to quit: 05/03/2009    Years since quitting: 8.9  . Smokeless tobacco: Never Used  Substance Use Topics  . Alcohol use: No    Comment: quit in 2008  . 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      Allergies   Imitrex [sumatriptan]   Review of Systems Review of Systems  Constitutional: Negative for fatigue and fever.  HENT: Negative for congestion, sinus pressure and sore throat.   Eyes: Negative for photophobia, pain and visual disturbance.  Respiratory: Negative for cough and shortness of breath.   Cardiovascular: Negative for chest pain.  Gastrointestinal: Positive for  nausea. Negative for abdominal pain and vomiting.  Genitourinary: Negative for decreased urine volume and hematuria.  Musculoskeletal: Negative for myalgias, neck pain and neck stiffness.  Neurological: Positive for dizziness, light-headedness and headaches. Negative for syncope, facial asymmetry, speech difficulty, weakness and numbness.     Physical Exam Triage Vital Signs ED Triage Vitals [04/15/18  1533]  Enc Vitals Group     BP 125/86     Pulse Rate 85     Resp 20     Temp 97.7 F (36.5 C)     Temp Source Oral     SpO2 97 %     Weight      Height      Head Circumference      Peak Flow      Pain Score      Pain Loc      Pain Edu?      Excl. in [place]?    No data found.  Updated Vital Signs BP 125/86 (BP Location: Left Arm)   Pulse 85   Temp 97.7 F (36.5 C) (Oral)   Resp 20   LMP 07/04/2014   SpO2 97%   Visual Acuity Right Eye Distance:   Left Eye Distance:   Bilateral Distance:    Right Eye Near:   Left Eye Near:    Bilateral Near:     Physical Exam  Constitutional: She is oriented to person, place, and time. She appears well-developed and well-nourished. No distress.  HENT:  Head: Normocephalic and atraumatic.  Mouth/Throat: Oropharynx is clear and moist.  Eyes: Pupils are equal, round, and reactive to light. Conjunctivae and EOM are normal.  Discomfort with looking upward to both sides  Neck: Neck supple.  Cardiovascular: Normal rate and regular rhythm.  No murmur heard. Pulmonary/Chest: Effort normal and breath sounds normal. No respiratory distress.  Breathing comfortably at rest, CTABL, no wheezing, rales or other adventitious sounds auscultated  Abdominal: Soft. There is no tenderness.  Musculoskeletal: She exhibits no edema.  Neurological: She is alert and oriented to person, place, and time.  Patient A&O x3, cranial nerves II-XII grossly intact, strength at shoulders, hips and knees 5/5, equal bilaterally, patellar reflex 2+ bilaterally. Normal  Finger to nose, RAM and heel to shin. Negative Romberg and Pronator Drift. Gait without abnormality.  Skin: Skin is warm and dry.  Psychiatric: She has a normal mood and affect.  Nursing note and vitals reviewed.    UC Treatments / Results  Labs (all labs ordered are listed, but only abnormal results are displayed) Labs Reviewed - No data to display  EKG None  Radiology No results found.  Procedures Procedures (including critical care time)  Medications Ordered in UC Medications  ketorolac (TORADOL) injection 60 mg (has no administration in time range)  metoCLOPramide (REGLAN) injection 5 mg (has no administration in time range)  dexamethasone (DECADRON) injection 10 mg (has no administration in time range)    Initial Impression / Assessment and Plan / UC Course  I have reviewed the triage vital signs and the nursing notes.  Pertinent labs & imaging results that were available during my care of the patient were reviewed by me and considered in my medical decision making (see chart for details).     Patient with headache, vital signs stable, no focal neuro deficit, headache seems most likely migraine versus tension type compared to intracranial abnormality.  Will provide headache cocktail with Toradol, Reglan and Decadron.  Naprosyn for further at home headache management.Discussed strict return precautions. Patient verbalized understanding and is agreeable with plan.  Final Clinical Impressions(s) / UC Diagnoses   Final diagnoses:  Migraine without aura and without status migrainosus, not intractable     Discharge Instructions     We gave you a shot of Toradol, Reglan and Decadron today for your headache, these should begin  working in approximately 30 to 40 minutes  Please continue to use Tylenol/ibuprofen or try Naprosyn as an alternative for further headache management at home  Please try to drink plenty of fluids  Please go to emergency room if headache not  improving, headache worsening, developing vision changes, one-sided weakness, difficulty speaking, worsening dizziness and lightheadedness   ED Prescriptions    Medication Sig Dispense Auth. Provider   naproxen (NAPROSYN) 500 MG tablet Take 1 tablet (500 mg total) by mouth 2 (two) times daily. 30 tablet Wieters, [place] C, PA-C     Controlled Substance Prescriptions [place] Controlled Substance Registry consulted? Not Applicable   Janith Lima, [place] 04/15/18 [phone]

## 2018-05-15 IMAGING — CT CT MAXILLOFACIAL W/ CM
3 of 4 series · 15 of 47 positions shown, 18 images · IV contrast (iopamidol)
Comparison: None.

CLINICAL DATA: Mass, lump or swelling of the face. Right-sided
dental pain. Right lip and face swelling.

EXAM:
CT MAXILLOFACIAL WITH CONTRAST
TECHNIQUE: Multidetector CT imaging of the maxillofacial structures was
performed with intravenous contrast. Multiplanar CT image
reconstructions were also generated.
CONTRAST:  75mL [id number] IOPAMIDOL ([id number]) INJECTION 61%

[Series 3: max soft · axial · 0.36mm/px · z∈[-208,-62]mm · 10 of 85 slices shown, 13 images]
[im 6/85  brain]
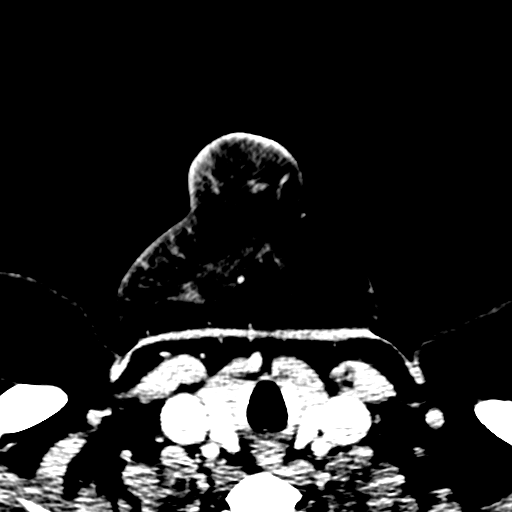
[im 6/85  bone]
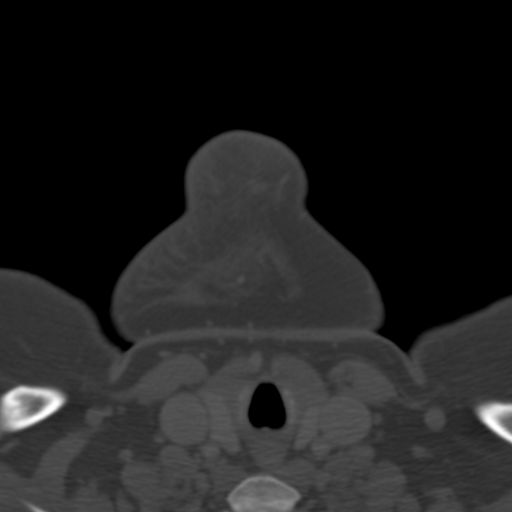
[im 15/85  bone]
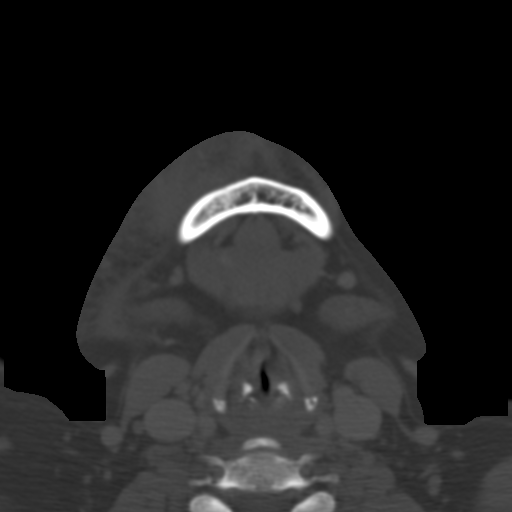
[im 24/85  bone]
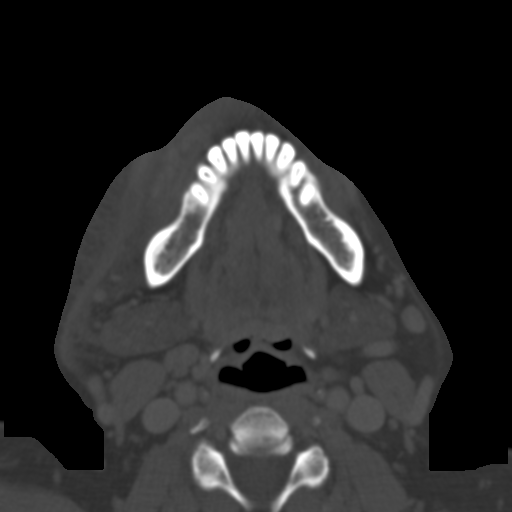
[im 29/85  bone]
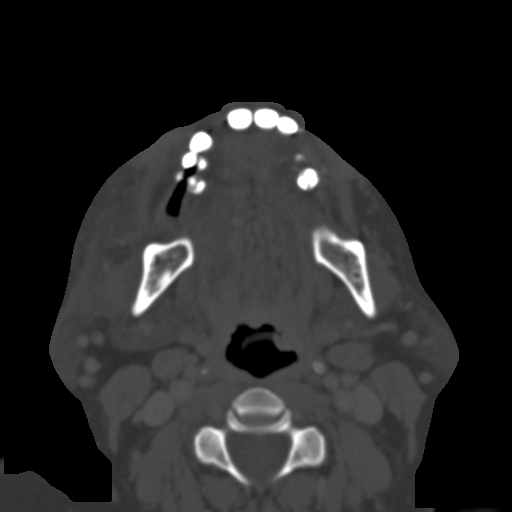
[im 38/85  brain]
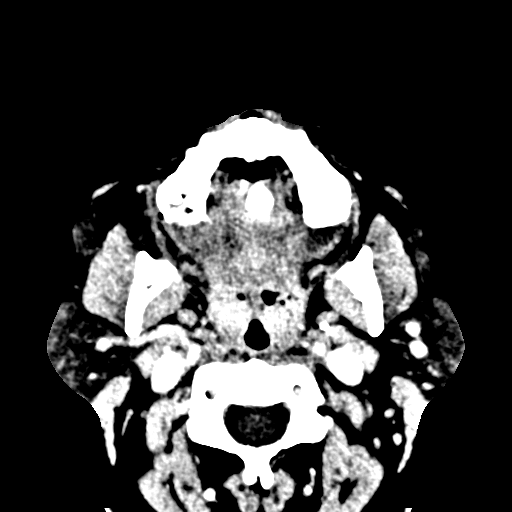
[im 38/85  bone]
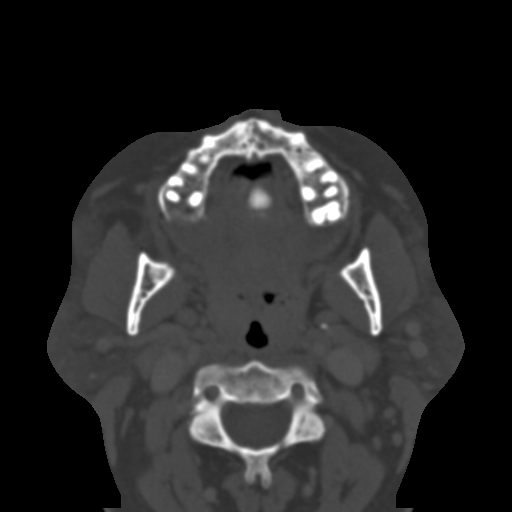
[im 47/85  bone]
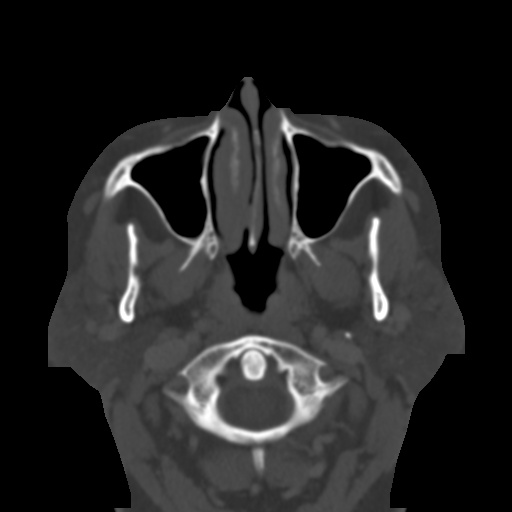
[im 56/85  bone]
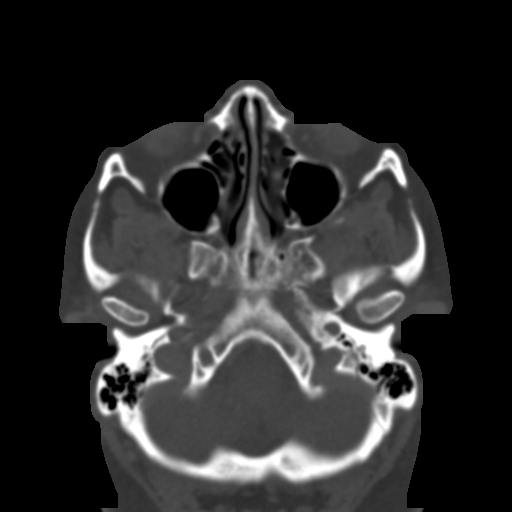
[im 64/85  bone]
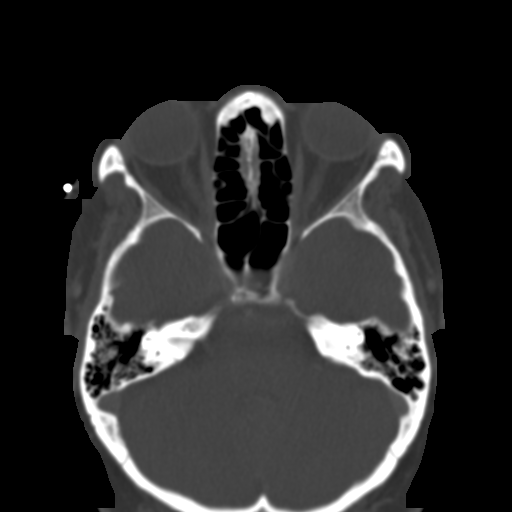
[im 70/85  brain]
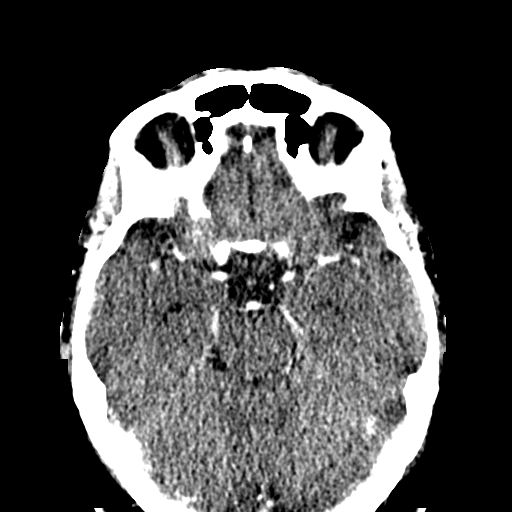
[im 70/85  bone]
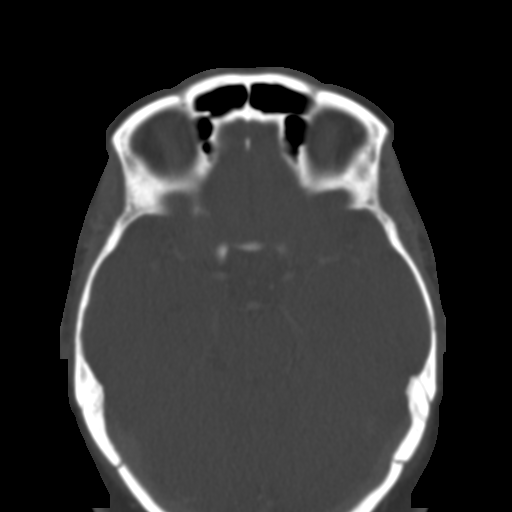
[im 79/85  bone]
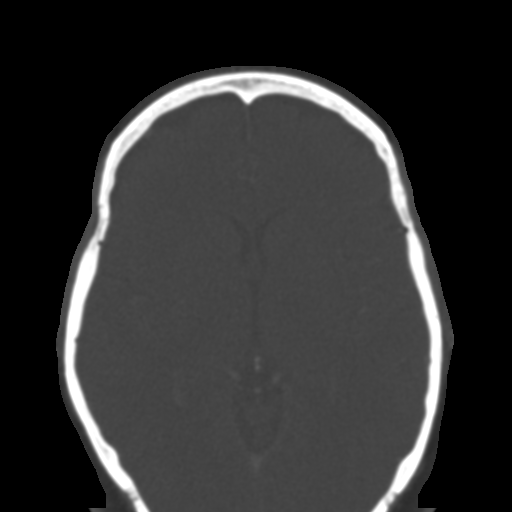

[Series 7: coronal soft · coronal · 0.34mm/px · 3 of 76 slices shown]
[im 26/76  bone]
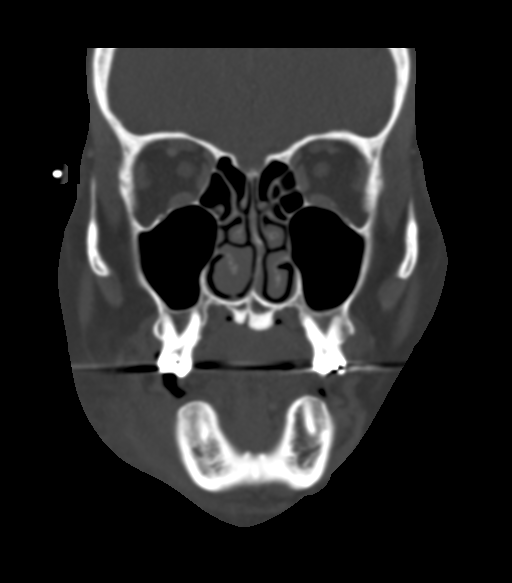
[im 34/76  bone]
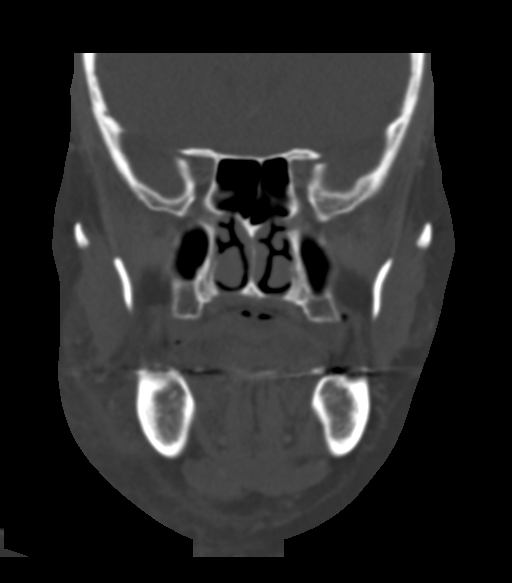
[im 42/76  bone]
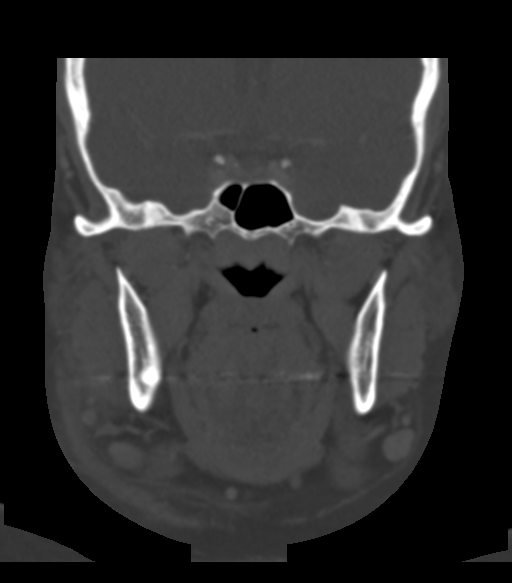

[Series 10: sagittal bone · sagittal · 0.35mm/px · 2 of 82 slices shown]
[im 28/82  bone]
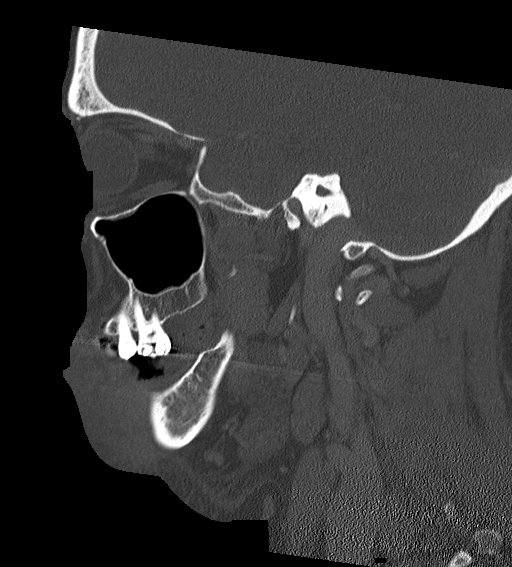
[im 55/82  bone]
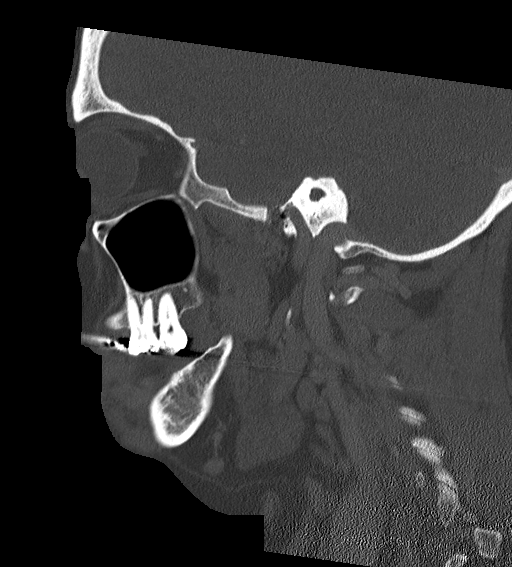

[15 of 47 positions shown; findings below may reference images not displayed]

FINDINGS: Osseous: A dental caries is present within the first right maxillary
premolar tooth (tooth #28) there is marked periapical lucency about
the tooth root with disruption of the anterior cortex.

There is also lucency about the root of the left maxillary canine
tooth (tooth #11).

Facial bones are otherwise intact. The mandible is otherwise intact
and located.

Orbits: The globes and orbits are within normal limits.

Sinuses: The paranasal sinuses and mastoid air cells are clear.

Soft tissues: Marked soft tissue swelling is present about the
right-sided mandible. There is focal scratched at there is a
low-density collection adjacent to the right side of the mandible at
the involved tooth measuring up to 14 mm compatible with a
subperiosteal abscess. Extensive soft tissue swelling extends to the
skin surface with asymmetric thickening of the right-sided platysma.
Right greater than left submandibular lymph nodes are likely
reactive. Right greater than left level 2 lymph nodes are also
present.

Limited intracranial: Unremarkable.
IMPRESSION: 1. Dental caries and periapical abscess involving the first right
maxillary premolar tooth with adjacent subperiosteal abscess and
marked soft tissue inflammation.
2. Focal periapical lucencies and dental caries involving the left
maxillary canine tooth without associated inflammatory change.

## 2018-05-25 ENCOUNTER — Ambulatory Visit: Payer: Managed Care, Other (non HMO) | Admitting: Internal Medicine

## 2018-05-28 DIAGNOSIS — I10 Essential (primary) hypertension: Secondary | ICD-10-CM | POA: Insufficient documentation

## 2018-05-28 DIAGNOSIS — D649 Anemia, unspecified: Secondary | ICD-10-CM | POA: Insufficient documentation

## 2018-05-28 DIAGNOSIS — R002 Palpitations: Secondary | ICD-10-CM | POA: Insufficient documentation

## 2018-06-07 ENCOUNTER — Ambulatory Visit: Payer: Managed Care, Other (non HMO) | Admitting: Internal Medicine

## 2018-06-29 ENCOUNTER — Ambulatory Visit (INDEPENDENT_AMBULATORY_CARE_PROVIDER_SITE_OTHER): Payer: Managed Care, Other (non HMO) | Admitting: Internal Medicine

## 2018-06-29 ENCOUNTER — Encounter: Payer: Self-pay | Admitting: Internal Medicine

## 2018-06-29 VITALS — BP 116/70 | HR 89 | Ht 63.0 in | Wt 243.8 lb

## 2018-06-29 DIAGNOSIS — Z8249 Family history of ischemic heart disease and other diseases of the circulatory system: Secondary | ICD-10-CM

## 2018-06-29 DIAGNOSIS — R0609 Other forms of dyspnea: Secondary | ICD-10-CM

## 2018-06-29 DIAGNOSIS — R002 Palpitations: Secondary | ICD-10-CM

## 2018-06-29 MED ORDER — METOPROLOL TARTRATE 100 MG PO TABS: ORAL_TABLET | ORAL | 0 refills | Status: DC

## 2018-06-29 NOTE — Patient Instructions (Signed)
Medication Instructions:  TAKE Metoprolol 1 tablet  (100mg ) 2 hours prior to your Cor CTA   If you need a refill on your cardiac medications before your next appointment, please call your pharmacy.   Lab work: Your physician recommends that you return for lab work 1-2 days prior to Cor CTA  If you have labs (blood work) drawn today and your tests are completely normal, you will receive your results only by: Marland Kitchen MyChart Message (if you have MyChart) OR . A paper copy in the mail If you have any lab test that is abnormal or we need to change your treatment, we will call you to review the results.  Testing/Procedures: Your physician has recommended that you wear an zio-patch monitor. Zio-patch monitors are medical devices that record the heart's electrical activity. Doctors most often [place] these monitors to diagnose arrhythmias. Arrhythmias are problems with the speed or rhythm of the heartbeat. The monitor is a small, portable device. You can wear one while you do your normal daily activities. This is usually used to diagnose what is causing palpitations/syncope (passing out).  Your physician has requested that you have an echocardiogram. Echocardiography is a painless test that uses sound waves to create images of your heart. It provides your doctor with information about the size and shape of your heart and how well your heart's chambers and valves are working. This procedure takes approximately one hour. There are no restrictions for this procedure.  Your physician has requested that you have cardiac CT. Cardiac computed tomography (CT) is a painless test that uses an x-ray machine to take clear, detailed pictures of your heart. For further information please visit [email or web address]. Please follow instruction sheet as given.      Follow-Up: At [facility], you and your health needs are our priority.  As part of our continuing mission to provide you with exceptional heart care, we have  created designated Provider Care Teams.  These Care Teams include your primary Cardiologist (physician) and Advanced Practice Providers (APPs -  Physician Assistants and Nurse Practitioners) who all work together to provide you with the care you need, when you need it. You will need a follow up appointment in 6 weeks.   You may see  Dr.Acharya or one of the following Advanced Practice Providers on your designated Care Team:   Rosaria Ferries, PA-C . Jory Sims, DNP, ANP  Any Other Special Instructions Will Be Listed Below (If Applicable). TBD AM (30-45 minutes prior to test start time)  [facility] [address], [place] [id number] [phone]  Proceed to the [facility] Radiology Department (First Floor).  Please follow these instructions carefully (unless otherwise directed):    On the Night Before the Test: . Be sure to Drink plenty of water. . Do not consume any caffeinated/decaffeinated beverages or chocolate 12 hours prior to your test. . Do not take any antihistamines 12 hours prior to your test.  On the Day of the Test: . Drink plenty of water. Do not drink any water within one hour of the test. . Do not eat any food 4 hours prior to the test. . You may take your regular medications prior to the test.  . Take metoprolol (Lopressor) two hours prior to test.       After the Test: . Drink plenty of water. . After receiving IV contrast, you may experience a mild flushed feeling. This is normal. . On occasion, you may experience a mild rash up  to 24 hours after the test. This is not dangerous. If this occurs, you can take Benadryl 25 mg and increase your fluid intake. . If you experience trouble breathing, this can be serious. If it is severe call 911 IMMEDIATELY. If it is mild, please call our office. . If you take any of these medications: Glipizide/Metformin, Avandament, Glucavance, please do not take 48 hours after completing test.

## 2018-06-29 NOTE — Progress Notes (Signed)
Cardiology Office Note:    Date:  06/29/2018   ID:  Gloria Hall, DOB 09/22/1970, MRN [id number]  PCP:  Patient, No Pcp Per  Cardiologist:  No primary care provider on file.  Electrophysiologist:  None   Referring MD: No ref. provider found   Exertional dyspnea and palpitations, fluttering in her chest.  History of Present Illness:    Gloria Hall is a 47 y.o. female with a hx of hypertension, migraines, anxiety, and life stressors who presents today for evaluation of several cardiovascular concerns.  For the past 2 to 3 years or more she has had a fluttering in her chest that causes her to taken a large gasp of air.  It lasts for short period of time and is not accompanied by chest discomfort.  More recently over the past 2 to 3 weeks she has had a racing in her chest which she calls palpitations.  She attributes her palpitations to increased life stressors and anxiety and has recently been given a prescription by her primary care provider for alprazolam 0.25 mg primarily for sleep but also for anxiety.  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  While walking around the office occasionally she will have an ache in her chest that has been going on for approximately 6 months.  In addition she will occasionally have a sense of dizziness that she describes as lightheadedness also for about 6 months.  She will have shortness of breath with exertion and attributes this to being deconditioned and out of shape.  She has 3 flights of stairs at her office which she will climb up on occasion and finds that she has to stop at each landing to catch her breath.  If she climbs to the top she will be quite short of breath and it will take minutes to recover.  She denies PND, orthopnea.  She has mild leg swelling at the end of the day.  Denies lifetime syncope or significant presyncope.  She snores on occasion.  Family history is significant for her father  having 4 MIs first of which in his 79s and her maternal grandfather having MI in his 74s.  Her maternal grandmother had a pacemaker.  She has a 3-5-pack-year smoking history and quit in 2011.  She denies significant alcohol use, recreational drug use, or herbal supplements/diet supplements.   Past Medical History:  Diagnosis Date  . Anemia   . Anxiety   . Arthritis   . Back pain, chronic   . Bronchitis   . Hypertension   . IBS (irritable bowel syndrome)   . LSIL (low grade squamous intraepithelial lesion) on Pap smear 08/21/2011  . Migraines   . Palpitations   . Peripheral neuropathic pain    in hands    Past Surgical History:  Procedure Laterality Date  . DILATION AND CURETTAGE OF UTERUS    . dilation and curretage     after a miscarriage  . ENDOMETRIAL ABLATION    . KNEE ARTHROSCOPY Left 12-01-13  . LAPAROSCOPY  03/19/2011   Procedure: LAPAROSCOPY OPERATIVE;  Surgeon: Hollie Salk C. Marice Potter, MD;  Location: [facility];  Service: Gynecology;  Laterality: N/A;  . NOVASURE ABLATION  03/19/2011   Procedure: NOVASURE ABLATION;  Surgeon: Hollie Salk C. Marice Potter, MD;  Location: [facility];  Service: Gynecology;  Laterality: N/A;  . TUBAL LIGATION  1993    Current Medications: Current Meds  Medication Sig  . Cholecalciferol (  VITAMIN D) 2000 units CAPS Take 1 capsule by mouth daily.  Marland Kitchen ibuprofen (ADVIL,MOTRIN) 800 MG tablet Take 1 tablet (800 mg total) by mouth 3 (three) times daily.  . Magnesium 500 MG CAPS Take 1 capsule by mouth daily.  . meloxicam (MOBIC) 15 MG tablet Take 1 tablet (15 mg total) by mouth daily.  Marland Kitchen omeprazole (PRILOSEC) 20 MG capsule Take 1 capsule (20 mg total) by mouth daily.  . vitamin E (VITAMIN E) 400 UNIT capsule Take 400 Units by mouth daily.     Allergies:   Imitrex [sumatriptan]   Social History   Socioeconomic History  . Marital status: Married    Spouse name: Gloria Hall  . Number of children: 3  . Years of education: 12th  . Highest education level: Not on file    Occupational History    Employer: [employer]    Comment: [employer]  Social Needs  . Financial resource strain: Not on file  . Food insecurity:    Worry: Not on file    Inability: Not on file  . Transportation needs:    Medical: Not on file    Non-medical: Not on file  Tobacco Use  . Smoking status: Former Smoker    Packs/day: 1.50    Years: 10.00    Pack years: 15.00    Types: Cigarettes    Last attempt to quit: 05/03/2009    Years since quitting: 9.1  . Smokeless tobacco: Never Used  Substance and Sexual Activity  . Alcohol use: No    Comment: quit in 2008  . 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   Marland Kitchen Sexual activity: Yes    Birth control/protection: Surgical  Lifestyle  . Physical activity:    Days per week: Not on file    Minutes per session: Not on file  . Stress: Not on file  Relationships  . Social connections:    Talks on phone: Not on file    Gets together: Not on file    Attends religious service: Not on file    Active member of club or organization: Not on file    Attends meetings of clubs or organizations: Not on file    Relationship status: Not on file  Other Topics Concern  . Not on file  Social History Narrative   Patient lives at home with spouse.   Caffeine Use: 2 cups of coffee and 2 cups of tea daily     Family History: The patient's family history includes Breast cancer in her maternal aunt; Cancer in her maternal aunt, maternal uncle, and maternal uncle; Crohn's disease in her maternal aunt; Diabetes in her maternal aunt, maternal grandmother, and mother; Fibromyalgia in her mother; Heart attack in her father and paternal grandfather; Heart attack (age of onset: 19) in her maternal grandfather; Hyperlipidemia in her mother; Hypertension in her mother; Migraines in her mother; Sleep apnea in her mother.  ROS:   Please see the history of present illness.     All other systems reviewed and are negative.  EKGs/Labs/Other Studies Reviewed:    The following studies were reviewed today:  EKG:  EKG is ordered today.  The ekg ordered today demonstrates normal sinus rhythm.  There is artifact at the initial portion of the ECG from acquisition.  Ventricular rate 89 bpm.  Recent Labs: 08/04/2017: BUN 11; Creatinine, Ser 0.63; Hemoglobin 11.5; Platelets  331; Potassium 3.5; Sodium 138  Recent Lipid Panel    Component Value Date/Time   CHOL 181 02/20/2014 1021   TRIG 129 02/20/2014 1021   HDL 56 02/20/2014 1021   CHOLHDL 3.2 02/20/2014 1021   VLDL 26 02/20/2014 1021   LDLCALC 99 02/20/2014 1021    Physical Exam:    VS:  BP 116/70   Pulse 89   Ht 5\' 3"  (1.6 m)   Wt 243 lb 12.8 oz (110.6 kg)   LMP 07/04/2014   BMI 43.19 kg/m     Wt Readings from Last 3 Encounters:  06/29/18 243 lb 12.8 oz (110.6 kg)  07/24/16 223 lb 6.4 oz (101.3 kg)  05/01/16 218 lb 8 oz (99.1 kg)    Constitutional: No acute distress Eyes: pupils equally round and reactive to light, sclera non-icteric, normal conjunctiva and lids ENMT: normal dentition, moist mucous membranes Cardiovascular: regular rhythm, normal rate, no murmurs. S1 and S2 normal. Radial pulses normal bilaterally. No jugular venous distention.  Respiratory: clear to auscultation bilaterally GI : normal bowel sounds, soft and nontender. No distention.   MSK: extremities warm, well perfused. No edema.  NEURO: grossly nonfocal exam, moves all extremities. PSYCH: alert and oriented x 3, normal mood and affect.   ASSESSMENT:    1. DOE (dyspnea on exertion)   2. Palpitations   3. Family history of early CAD    PLAN:    1. DOE (dyspnea on exertion)   2. Palpitations   3. Family history of early CAD    Her most prominent symptom is palpitations and for that we should obtain a ZIO patch.  There is nothing on ECG to suggest an etiology for her palpitations.  She has dyspnea on exertion and an ache  in her chest with exertion, and does not have an echocardiogram previously performed.  We will obtain an echocardiogram to ensure there are no structural changes to the heart.  With her family history of early MI and chest pain and dyspnea on exertion, it would be reasonable to perform functional testing.  I believe a CT coronary angiogram would be our best test to assess for coronary artery calcium, coronary artery stenosis, and if necessary FFR for functional testing.  I have instructed the patient that this may take a few weeks to complete and if she has worsening symptoms she should contact the office or present to the emergency department.  I will see her back after her CT scan is completed to discuss further recommendations.  Medication Adjustments/Labs and Tests Ordered: Current medicines are reviewed at length with the patient today.  Concerns regarding medicines are outlined above.  Orders Placed This Encounter  Procedures  . CT CORONARY MORPH W/CTA COR W/SCORE W/CA W/CM &/OR WO/CM  . CT CORONARY FRACTIONAL FLOW RESERVE DATA PREP  . CT CORONARY FRACTIONAL FLOW RESERVE FLUID ANALYSIS  . Basic metabolic panel  . LONG TERM MONITOR (3-14 DAYS)  . EKG 12-Lead  . ECHOCARDIOGRAM COMPLETE   Meds ordered this encounter  Medications  . metoprolol tartrate (LOPRESSOR) 100 MG tablet    Sig: Take 1 tablet (100mg ) 2 hours prior to your Cor CTA    Dispense:  1 tablet    Refill:  0    Patient Instructions  Medication Instructions:  TAKE Metoprolol 1 tablet  (100mg ) 2 hours prior to your Cor CTA   If you need a refill on your cardiac medications before your next appointment, please call your pharmacy.   Lab work:  Your physician recommends that you return for lab work 1-2 days prior to Cor CTA  If you have labs (blood work) drawn today and your tests are completely normal, you will receive your results only by: Marland Kitchen MyChart Message (if you have MyChart) OR . A paper copy in the mail If you  have any lab test that is abnormal or we need to change your treatment, we will call you to review the results.  Testing/Procedures: Your physician has recommended that you wear an zio-patch monitor. Zio-patch monitors are medical devices that record the heart's electrical activity. Doctors most often [place] these monitors to diagnose arrhythmias. Arrhythmias are problems with the speed or rhythm of the heartbeat. The monitor is a small, portable device. You can wear one while you do your normal daily activities. This is usually used to diagnose what is causing palpitations/syncope (passing out).  Your physician has requested that you have an echocardiogram. Echocardiography is a painless test that uses sound waves to create images of your heart. It provides your doctor with information about the size and shape of your heart and how well your heart's chambers and valves are working. This procedure takes approximately one hour. There are no restrictions for this procedure.  Your physician has requested that you have cardiac CT. Cardiac computed tomography (CT) is a painless test that uses an x-ray machine to take clear, detailed pictures of your heart. For further information please visit [email or web address] Please follow instruction sheet as given.      Follow-Up: At [facility], you and your health needs are our priority.  As part of our continuing mission to provide you with exceptional heart care, we have created designated Provider Care Teams.  These Care Teams include your primary Cardiologist (physician) and Advanced Practice Providers (APPs -  Physician Assistants and Nurse Practitioners) who all work together to provide you with the care you need, when you need it. You will need a follow up appointment in 6 weeks.   You may see  Dr.Jerricka Carvey or one of the following Advanced Practice Providers on your designated Care Team:   Theodore Demark, PA-C . Joni Reining, DNP, ANP  Any Other  Special Instructions Will Be Listed Below (If Applicable). TBD AM (30-45 minutes prior to test start time)  [facility] [address] [place], [place] [address] [phone]  Proceed to the [facility] Radiology Department (First Floor).  Please follow these instructions carefully (unless otherwise directed):    On the Night Before the Test: . Be sure to Drink plenty of water. . Do not consume any caffeinated/decaffeinated beverages or chocolate 12 hours prior to your test. . Do not take any antihistamines 12 hours prior to your test.  On the Day of the Test: . Drink plenty of water. Do not drink any water within one hour of the test. . Do not eat any food 4 hours prior to the test. . You may take your regular medications prior to the test.  . Take metoprolol (Lopressor) two hours prior to test.       After the Test: . Drink plenty of water. . After receiving IV contrast, you may experience a mild flushed feeling. This is normal. . On occasion, you may experience a mild rash up to 24 hours after the test. This is not dangerous. If this occurs, you can take Benadryl 25 mg and increase your fluid intake. . If you experience trouble breathing, this can be serious. If it is severe  call 911 IMMEDIATELY. If it is mild, please call our office. . If you take any of these medications: Glipizide/Metformin, Avandament, Glucavance, please do not take 48 hours after completing test.       Signed, Parke Poisson, MD  06/29/2018 4:56 PM    [facility]

## 2018-07-15 ENCOUNTER — Ambulatory Visit (INDEPENDENT_AMBULATORY_CARE_PROVIDER_SITE_OTHER): Payer: Managed Care, Other (non HMO)

## 2018-07-15 ENCOUNTER — Other Ambulatory Visit: Payer: Self-pay

## 2018-07-15 ENCOUNTER — Ambulatory Visit (HOSPITAL_COMMUNITY): Payer: Managed Care, Other (non HMO) | Attending: Cardiovascular Disease

## 2018-07-15 DIAGNOSIS — R002 Palpitations: Secondary | ICD-10-CM | POA: Diagnosis not present

## 2018-07-15 DIAGNOSIS — R0609 Other forms of dyspnea: Secondary | ICD-10-CM | POA: Insufficient documentation

## 2018-07-16 ENCOUNTER — Telehealth: Payer: Self-pay

## 2018-07-16 NOTE — Telephone Encounter (Signed)
Patient made aware and verbalized understanding.

## 2018-07-16 NOTE — Telephone Encounter (Signed)
-----   Message from Elouise Munroe, MD sent at 07/16/2018  8:27 AM EST ----- Normal echo

## 2018-07-16 NOTE — Telephone Encounter (Signed)
Called to give pt echo results.lmtcb 

## 2018-07-16 NOTE — Telephone Encounter (Signed)
Follow up  ° ° ° °Patient is returning call in reference to echo results. Please call.  °

## 2018-08-13 ENCOUNTER — Telehealth: Payer: Self-pay

## 2018-08-13 NOTE — Telephone Encounter (Signed)
Pt is scheduled for a f/u appt on 08/16/18 @8am  with Dr.Acharya. Pt Cardiac CT was denied by her insurance and the appeal is still pending.  Benancio Deeds 07/14/2018  2:48 PM PENDING WITH EVICORE  Benancio Deeds 07/16/2018 10:17 AM DENIED. FAXED TO EXPEDITED APPEALS  Howie Ill 07/23/2018 11:12 AM please update   Earvin Hansen J 08/02/2018  8:25 AM please update njm  Howie Ill 08/12/2018  1:59 PM please update njm   Called to see if pt wants to keep her scheduled appt or reschedule after the appeal is possible approved and the test is completed. lmtcb.

## 2018-08-16 ENCOUNTER — Ambulatory Visit: Payer: Self-pay | Admitting: Internal Medicine

## 2018-09-03 ENCOUNTER — Telehealth: Payer: Self-pay

## 2018-09-03 NOTE — Telephone Encounter (Signed)
Pt aware of cardiac monitor results with verbalized understanding. She sts that the palpitations are tolerable and she does not think they need to be treated with medication at this time. She stated that she has new health insurance and will stop by the office at her convenience to provide a copy of her new card. Her Cor CT was denied by her previous insurance. She is now covered by [facility], she would like to know if prior-auth for the Cor CT could be obtained from [facility]. Adv pt that I am not sure, I will talk with someone in our billing dept and give her a call back.

## 2018-09-03 NOTE — Telephone Encounter (Signed)
-----   Message from Elouise Munroe, MD sent at 09/03/2018 10:40 AM EST ----- No worrisome heart rhythms.If she would like to take a low dose metoprolol tartrate 12.5 bid for SVT, we can certainly try that but it is not mandatory since the rhythm is no worrisome and the medication is primarly for symptoms. Please feel free to schedule follow up to discuss further.

## 2018-09-07 NOTE — Telephone Encounter (Signed)
-----   Message from Elouise Munroe, MD sent at 09/03/2018 10:40 AM EST ----- No worrisome heart rhythms.If she would like to take a low dose metoprolol tartrate 12.5 bid for SVT, we can certainly try that but it is not mandatory since the rhythm is no worrisome and the medication is primarly for symptoms. Please feel free to schedule follow up to discuss further.

## 2018-09-07 NOTE — Telephone Encounter (Signed)
Spoke with pt. Adv her that I have talked to our Agricultural engineer. We can submit a prior-auth for her Cardiac CT to her new insurance. Pt sts that she will stop by the office one day this week to drop off a copy of her insurance card. Asked pt to have a message sent to me that she has provided the info and I can get the ball rolling with getting her Cardiac CT ordered and scheduled.

## 2019-01-18 ENCOUNTER — Telehealth: Payer: Self-pay | Admitting: *Deleted

## 2019-01-18 NOTE — Telephone Encounter (Signed)
Spoke with patient  She states   She still would like to proceed with CT SCAN . She states her new insurance is  [employer].  RN informed patient  That  Pre-cert will be contacting her to get  Correct information

## 2019-01-18 NOTE — Telephone Encounter (Signed)
-----   Message from Elouise Munroe, MD sent at 12/30/2018 12:20 PM EDT ----- Regarding: RE: does patient still need CT Could someone call Ms Petsch and discuss if she would still like the test done? If she is feeling better we can wait on the CT and arrange follow up, if she is having similar symptoms, we can complete the CT and then schedule follow up.  Let me know if I can help.  [place] ----- Message ----- From: Raiford Simmonds, RN Sent: 12/30/2018   9:39 AM EDT To: Raiford Simmonds, RN, Elouise Munroe, MD Subject: does patient still need CT                     RECEIVED MESSAGE On this patient from [employer] -  She sent this original to [place]. Can you address if CT IS NEEDED-so I can let them know  thanks Samra Pesch  Lattie Haw please see the communication below with the insurance company regarding the Ct.  Per Caryl Pina she don't have this insurance anymore.  Checking to see if she still need the ct. If she do I will reach out to the patient to update the insurance.

## 2019-01-19 NOTE — Telephone Encounter (Signed)
Left a message for patient to call regarding updating her insurance so we can send it to precert.   Once this is done she will be schedule for the CT

## 2019-02-18 ENCOUNTER — Ambulatory Visit (HOSPITAL_COMMUNITY): Payer: BC Managed Care – PPO

## 2019-03-10 ENCOUNTER — Other Ambulatory Visit: Payer: Self-pay

## 2019-03-10 DIAGNOSIS — Z20822 Contact with and (suspected) exposure to covid-19: Secondary | ICD-10-CM

## 2019-03-12 LAB — SPECIMEN STATUS REPORT

## 2019-03-12 LAB — NOVEL CORONAVIRUS, NAA: SARS-CoV-2, NAA: NOT DETECTED

## 2019-03-28 ENCOUNTER — Other Ambulatory Visit: Payer: Self-pay

## 2019-03-28 DIAGNOSIS — Z20822 Contact with and (suspected) exposure to covid-19: Secondary | ICD-10-CM

## 2019-03-29 LAB — NOVEL CORONAVIRUS, NAA: SARS-CoV-2, NAA: NOT DETECTED

## 2019-08-09 ENCOUNTER — Ambulatory Visit: Payer: BC Managed Care – PPO | Attending: Internal Medicine

## 2019-08-09 DIAGNOSIS — Z20822 Contact with and (suspected) exposure to covid-19: Secondary | ICD-10-CM

## 2019-08-11 LAB — NOVEL CORONAVIRUS, NAA: SARS-CoV-2, NAA: NOT DETECTED

## 2019-09-01 ENCOUNTER — Other Ambulatory Visit: Payer: BC Managed Care – PPO

## 2019-09-12 ENCOUNTER — Ambulatory Visit: Payer: BC Managed Care – PPO | Attending: Internal Medicine

## 2019-09-12 DIAGNOSIS — Z20822 Contact with and (suspected) exposure to covid-19: Secondary | ICD-10-CM

## 2019-09-13 LAB — NOVEL CORONAVIRUS, NAA: SARS-CoV-2, NAA: NOT DETECTED

## 2020-06-29 ENCOUNTER — Other Ambulatory Visit: Payer: Self-pay

## 2020-06-29 ENCOUNTER — Ambulatory Visit
Admission: RE | Admit: 2020-06-29 | Discharge: 2020-06-29 | Disposition: A | Discharge status: Home / Self Care | Payer: BC Managed Care – PPO | Source: Ambulatory Visit | Attending: Emergency Medicine | Admitting: Emergency Medicine

## 2020-06-29 VITALS — BP 122/87 | HR 93 | Temp 97.4°F | Resp 18

## 2020-06-29 DIAGNOSIS — M25562 Pain in left knee: Secondary | ICD-10-CM

## 2020-06-29 MED ORDER — PREDNISONE 10 MG (21) PO TBPK: ORAL_TABLET | Freq: Every day | ORAL | 0 refills | Status: DC

## 2020-06-29 MED ORDER — DICLOFENAC SODIUM 1 % EX GEL: 2.0000 g | Freq: Four times a day (QID) | CUTANEOUS | 0 refills | Status: DC

## 2020-06-29 NOTE — ED Provider Notes (Signed)
[facility]    CSN: [id number] Arrival date & time: 06/29/20  5809      History   Chief Complaint Chief Complaint  Patient presents with  . Knee Pain    HPI Gloria Hall is a 49 y.o. female  With extensive history as below presenting for acute on chronic left knee pain.  Patient states that she felt a pop in her left knee posteriorly and has had difficulty with range of motion and ambulating since.  Currently uses a cane.  Has had extensive orthopedic evaluation for bilateral OA of knee.  Has not seen her orthopedic provider since this happened.  Denies injury, distal leg swelling or numbness, discoloration.  Past Medical History:  Diagnosis Date  . Anemia   . Anxiety   . Arthritis   . Back pain, chronic   . Bronchitis   . Hypertension   . IBS (irritable bowel syndrome)   . LSIL (low grade squamous intraepithelial lesion) on Pap smear 08/21/2011  . Migraines   . Palpitations   . Peripheral neuropathic pain    in hands    Patient Active Problem List   Diagnosis Date Noted  . Palpitations   . Hypertension   . Anemia   . Perimenopausal vasomotor symptoms 05/01/2016  . DUB (dysfunctional uterine bleeding) 07/19/2014  . Bilateral carpal tunnel syndrome 04/12/2014  . Facial cellulitis 05/03/2012  . Anxiety 05/03/2012  . LSIL (low grade squamous intraepithelial lesion) on Pap smear 08/21/2011    Past Surgical History:  Procedure Laterality Date  . DILATION AND CURETTAGE OF UTERUS    . dilation and curretage     after a miscarriage  . ENDOMETRIAL ABLATION    . KNEE ARTHROSCOPY Left 12-01-13  . LAPAROSCOPY  03/19/2011   Procedure: LAPAROSCOPY OPERATIVE;  Surgeon: Juliene Pina C. Hulan Fray, MD;  Location: [facility];  Service: Gynecology;  Laterality: N/A;  . NOVASURE ABLATION  03/19/2011   Procedure: NOVASURE ABLATION;  Surgeon: Juliene Pina C. Hulan Fray, MD;  Location: [facility];  Service: Gynecology;  Laterality: N/A;  . TUBAL LIGATION  1993    OB History    Gravida  4   Para    3   Term  3   Preterm      AB  1   Living  3     SAB  1   TAB      Ectopic      Multiple      Live Births               Home Medications    Prior to Admission medications   Medication Sig Start Date End Date Taking? Authorizing Provider  Cholecalciferol (VITAMIN D) 2000 units CAPS Take 1 capsule by mouth daily.    [provider]  diclofenac Sodium (VOLTAREN) 1 % GEL Apply 2 g topically 4 (four) times daily. 06/29/20   Hall-Potvin, [place], PA-C  ibuprofen (ADVIL,MOTRIN) 800 MG tablet Take 1 tablet (800 mg total) by mouth 3 (three) times daily. 08/04/17   Vanessa Kick, MD  Magnesium 500 MG CAPS Take 1 capsule by mouth daily.    [provider]  meloxicam (MOBIC) 15 MG tablet Take 1 tablet (15 mg total) by mouth daily. 07/07/17   Landis Martins, DPM  metoprolol tartrate (LOPRESSOR) 100 MG tablet Take 1 tablet (100mg ) 2 hours prior to your Cor CTA 06/29/18   Elouise Munroe, MD  omeprazole (PRILOSEC) 20 MG capsule Take 1 capsule (20 mg total) by mouth daily.  04/14/14   Lance Bosch, NP  predniSONE (STERAPRED UNI-PAK 21 TAB) 10 MG (21) TBPK tablet Take by mouth daily. Take steroid taper as written 06/29/20   Hall-Potvin, [place], PA-C  vitamin E (VITAMIN E) 400 UNIT capsule Take 400 Units by mouth daily.    [provider]    Family History Family History  Problem Relation Age of Onset  . Diabetes Mother        90  . Hypertension Mother   . Hyperlipidemia Mother   . Fibromyalgia Mother   . Sleep apnea Mother   . Migraines Mother   . Heart attack Father        13  . Diabetes Maternal Aunt   . Cancer Maternal Aunt        breast, metastatic cancer throughout body  . Breast cancer Maternal Aunt   . Crohn's disease Maternal Aunt   . Cancer Maternal Uncle        leukemia  . Diabetes Maternal Grandmother   . Cancer Maternal Uncle        colon  . Heart attack Maternal Grandfather 55  . Heart attack Paternal Grandfather      Social History Social History   Tobacco Use  . Smoking status: Former Smoker    Packs/day: 1.50    Years: 10.00    Pack years: 15.00    Types: Cigarettes    Quit date: 05/03/2009    Years since quitting: 11.1  . Smokeless tobacco: Never Used  Vaping Use  . Vaping Use: Never used  Substance Use Topics  . Alcohol use: No    Comment: quit in 2008  . 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      Allergies   Imitrex [sumatriptan]   Review of Systems Review of Systems  Constitutional: Negative for fatigue and fever.  Respiratory: Negative for cough and shortness of breath.   Cardiovascular: Negative for chest pain and palpitations.  Musculoskeletal:       Positive for acute on chronic L knee pain  Neurological: Negative for weakness and numbness.     Physical Exam Triage Vital Signs ED Triage Vitals  Enc Vitals Group     BP      Pulse      Resp      Temp      Temp src      SpO2      Weight      Height      Head Circumference      Peak Flow      Pain Score      Pain Loc      Pain Edu?      Excl. in [place]?    No data found.  Updated Vital Signs BP 122/87 (BP Location: Left Arm)   Pulse 93   Temp (!) 97.4 F (36.3 C)   Resp 18   LMP 07/04/2014   SpO2 99%   Visual Acuity Right Eye Distance:   Left Eye Distance:   Bilateral Distance:    Right Eye Near:   Left Eye Near:    Bilateral Near:     Physical Exam Constitutional:      General: She is not in acute distress. HENT:     Head: Normocephalic and atraumatic.  Eyes:     General: No scleral icterus.    Pupils: Pupils are equal, round, and reactive to light.  Cardiovascular:     Rate and Rhythm: Normal rate.  Pulmonary:     Effort: Pulmonary effort is normal.  Musculoskeletal:     Right knee: Normal.     Left knee: Crepitus present. No swelling, deformity, effusion, erythema, ecchymosis or bony tenderness. Decreased range of motion. Tenderness  present. Normal alignment and normal patellar mobility.       Legs:  Skin:    Coloration: Skin is not jaundiced or pale.  Neurological:     Mental Status: She is alert and oriented to person, place, and time.      UC Treatments / Results  Labs (all labs ordered are listed, but only abnormal results are displayed) Labs Reviewed - No data to display  EKG   Radiology No results found.  Procedures Procedures (including critical care time)  Medications Ordered in UC Medications - No data to display  Initial Impression / Assessment and Plan / UC Course  I have reviewed the triage vital signs and the nursing notes.  Pertinent labs & imaging results that were available during my care of the patient were reviewed by me and considered in my medical decision making (see chart for details).     Patient concerned for meniscus injury.  Discussed low utility of x-ray in this setting as there was no injury, patient has extensive osteoarthritis in affected joint and joint itself is stable, without swelling, or bony tenderness.  Patient electing to defer radiography: Requesting pain medications.  Will trial steroids, follow-up with orthopedic provider for further evaluation and management.  Return precautions discussed, pt verbalized understanding and is agreeable to plan. Final Clinical Impressions(s) / UC Diagnoses   Final diagnoses:  Acute pain of left knee     Discharge Instructions     RICE: rest, ice, compression, elevation as needed for pain.    Pain medication:    Important to follow up with specialist(s) below for further evaluation/management if your symptoms persist or worsen.    ED Prescriptions    Medication Sig Dispense Auth. Provider   diclofenac Sodium (VOLTAREN) 1 % GEL Apply 2 g topically 4 (four) times daily. 100 g Hall-Potvin, [place], PA-C   predniSONE (STERAPRED UNI-PAK 21 TAB) 10 MG (21) TBPK tablet Take by mouth daily. Take steroid taper as written 21  tablet Hall-Potvin, [place], PA-C     I have reviewed the PDMP during this encounter.   Hall-Potvin, [place], [place] 06/29/20 1032

## 2020-06-29 NOTE — ED Triage Notes (Signed)
Pt present left knee pain, pt states on Wednesday she felt a pop in her left knee and since then she has been unable to walk or do any ROM.

## 2020-06-29 NOTE — Discharge Instructions (Addendum)
RICE: rest, ice, compression, elevation as needed for pain.    Pain medication:    Important to follow up with specialist(s) below for further evaluation/management if your symptoms persist or worsen.

## 2021-04-15 ENCOUNTER — Encounter: Payer: Self-pay | Admitting: Internal Medicine

## 2021-04-15 ENCOUNTER — Other Ambulatory Visit: Payer: Self-pay

## 2021-04-15 ENCOUNTER — Ambulatory Visit (INDEPENDENT_AMBULATORY_CARE_PROVIDER_SITE_OTHER): Payer: BC Managed Care – PPO | Admitting: Internal Medicine

## 2021-04-15 VITALS — BP 124/78 | HR 76 | Ht 63.0 in | Wt 229.0 lb

## 2021-04-15 DIAGNOSIS — R072 Precordial pain: Secondary | ICD-10-CM

## 2021-04-15 DIAGNOSIS — I10 Essential (primary) hypertension: Secondary | ICD-10-CM | POA: Diagnosis not present

## 2021-04-15 DIAGNOSIS — E785 Hyperlipidemia, unspecified: Secondary | ICD-10-CM | POA: Diagnosis not present

## 2021-04-15 DIAGNOSIS — R002 Palpitations: Secondary | ICD-10-CM | POA: Diagnosis not present

## 2021-04-15 MED ORDER — METOPROLOL TARTRATE 100 MG PO TABS: 100.0000 mg | ORAL_TABLET | Freq: Once | ORAL | 0 refills | Status: DC

## 2021-04-15 NOTE — Patient Instructions (Addendum)
Medication Instructions:  PLEASE FOLLOW MEDICATION INSTRUCTIONS FOR CORONARY CTA *If you need a refill on your cardiac medications before your next appointment, please call your pharmacy*  Lab Work: [place]  If you have labs (blood work) drawn today and your tests are completely normal, you will receive your results only by: [place] (if you have MyChart) OR A paper copy in the mail If you have any lab test that is abnormal or we need to change your treatment, we will call you to review the results.  Testing/Procedures: Your physician has requested that you have cardiac CT. Cardiac computed tomography (CT) is a painless test that uses an x-ray machine to take clear, detailed pictures of your heart. For further information please visit HugeFiesta.tn. Please follow instruction sheet as given.  Follow-Up: At [facility], you and your health needs are our priority.  As part of our continuing mission to provide you with exceptional heart care, we have created designated Provider Care Teams.  These Care Teams include your primary Cardiologist (physician) and Advanced Practice Providers (APPs -  Physician Assistants and Nurse Practitioners) who all work together to provide you with the care you need, when you need it.  Your next appointment:   October 20th at 11:40am   The format for your next appointment:   In Person  Provider:   Cherlynn Kaiser, MD  Other Instructions [place] CTA SCAN     Your cardiac CT will be scheduled at one of the below locations:   [facility] [place], [place] [address] [phone]  If scheduled at [facility], please arrive at the [facility] main entrance (entrance A) of [facility] 30 minutes prior to test start time. Proceed to the [facility] Radiology Department (first floor) to check-in and test prep.  Please follow  these instructions carefully (unless otherwise directed):  On the Night Before the Test: Be sure to Drink plenty of water. Do not consume any caffeinated/decaffeinated beverages or chocolate 12 hours prior to your test. Do not take any antihistamines 12 hours prior to your test.  On the Day of the Test: Drink plenty of water until 1 hour prior to the test. Do not eat any food 4 hours prior to the test. You may take your regular medications prior to the test.  Take metoprolol (Lopressor) '100mg'$  two hours prior to test. HOLD Furosemide/Hydrochlorothiazide morning of the test. FEMALES- please wear underwire-free bra if available, avoid dresses & tight clothing      After the Test: Drink plenty of water. After receiving IV contrast, you may experience a mild flushed feeling. This is normal. On occasion, you may experience a mild rash up to 24 hours after the test. This is not dangerous. If this occurs, you can take Benadryl 25 mg and increase your fluid intake. If you experience trouble breathing, this can be serious. If it is severe call 911 IMMEDIATELY. If it is mild, please call our office. If you take any of these medications: Glipizide/Metformin, Avandament, Glucavance, please do not take 48 hours after completing test unless otherwise instructed.  Please allow 2-4 weeks for scheduling of routine cardiac CTs. Some insurance companies require a pre-authorization which may delay scheduling of this test.   For non-scheduling related questions, please contact the cardiac imaging nurse navigator should you have any questions/concerns: Marchia Bond, Cardiac Imaging Nurse Navigator Gordy Clement, Cardiac Imaging Nurse Navigator [place]  [facility] Direct Office Dial: [phone]   For scheduling needs, including cancellations and rescheduling, please call [place], [phone].

## 2021-04-16 NOTE — Progress Notes (Signed)
Cardiology Office Note:    Date:  04/16/2021   ID:  Lenon Oms, DOB 1971-05-02, MRN [id number]  PCP:  Patient, No Pcp Per (Inactive)  Cardiologist:  None  Electrophysiologist:  None   Referring MD: No ref. provider found   Chief Complaint/Reason for Referral: Chest pain  History of Present Illness:    Gloria Hall is a 50 y.o. female with a history of hypertension, migraines, anxiety who presents today to revision the concern of chest pain and continued palpitations.  Fluttering sensation in chest is the same as our last visit in 2019 but she continues to feel this is related to stress.  She recently has noticed a hot flash sensation and diaphoresis. Her BP has been elevated and sometimes is 140/90 - she will noticed chest pain with this. Normally BP is 120/70s.   Chest pain has happened several times in the last 6 mo, mostly in AM and is a dull ache in chest. It will also make her SOB and she will get an "uncomfortable pain" that then radiates to between shoulder blades.   The patient denies PND, orthopnea, or leg swelling. Denies cough, fever, chills. Denies nausea, vomiting. Denies syncope or presyncope. Denies dizziness or lightheadedness.    FHX: MGF - MI age 10, fatal. First MI age 65. Father - MI in 65s.   Past Medical History:  Diagnosis Date   Anemia    Anxiety    Arthritis    Back pain, chronic    Bronchitis    Hypertension    IBS (irritable bowel syndrome)    LSIL (low grade squamous intraepithelial lesion) on Pap smear 08/21/2011   Migraines    Palpitations    Peripheral neuropathic pain    in hands    Past Surgical History:  Procedure Laterality Date   DILATION AND CURETTAGE OF UTERUS     dilation and curretage     after a miscarriage   ENDOMETRIAL ABLATION     KNEE ARTHROSCOPY Left 12-01-13   LAPAROSCOPY  03/19/2011   Procedure: LAPAROSCOPY OPERATIVE;  Surgeon: Juliene Pina C. Hulan Fray, MD;  Location: [facility];  Service: Gynecology;  Laterality: N/A;   NOVASURE  ABLATION  03/19/2011   Procedure: NOVASURE ABLATION;  Surgeon: Juliene Pina C. Hulan Fray, MD;  Location: [facility];  Service: Gynecology;  Laterality: N/A;   TUBAL LIGATION  1993    Current Medications: Current Meds  Medication Sig   ALPRAZolam (XANAX) 0.5 MG tablet Take 0.5 mg by mouth daily as needed.   Cholecalciferol (VITAMIN D) 2000 units CAPS Take 1 capsule by mouth daily.   diclofenac Sodium (VOLTAREN) 1 % GEL Apply 2 g topically 4 (four) times daily.   ibuprofen (ADVIL,MOTRIN) 800 MG tablet Take 1 tablet (800 mg total) by mouth 3 (three) times daily.   Magnesium 500 MG CAPS Take 1 capsule by mouth daily.   metoprolol tartrate (LOPRESSOR) 100 MG tablet Take 1 tablet (100 mg total) by mouth once for 1 dose. PLEASE TAKE METOPROLOL 2  HOURS PRIOR TO CTA SCAN.   omeprazole (PRILOSEC) 20 MG capsule Take 1 capsule (20 mg total) by mouth daily.   ondansetron (ZOFRAN) 8 MG tablet ondansetron HCl 8 mg tablet   PARoxetine (PAXIL) 10 MG tablet Take 10 mg by mouth daily.   Semaglutide, 1 MG/DOSE, (OZEMPIC, 1 MG/DOSE,) 4 MG/3ML SOPN Ozempic 1 mg/dose (4 mg/3 mL) subcutaneous pen injector   vitamin E 180 MG (400 UNITS) capsule Take 400 Units by mouth daily.   [DISCONTINUED] meloxicam ([place])  15 MG tablet Take 1 tablet (15 mg total) by mouth daily.   [DISCONTINUED] metoprolol tartrate (LOPRESSOR) 100 MG tablet Take 1 tablet ('100mg'$ ) 2 hours prior to your Cor CTA   [DISCONTINUED] predniSONE (STERAPRED UNI-PAK 21 TAB) 10 MG (21) TBPK tablet Take by mouth daily. Take steroid taper as written     Allergies:   Imitrex [sumatriptan]   Social History   Tobacco Use   Smoking status: Former    Packs/day: 1.50    Years: 10.00    Pack years: 15.00    Types: Cigarettes    Quit date: 05/03/2009    Years since quitting: 11.9   Smokeless tobacco: Never  Vaping Use   Vaping Use: Never used  Substance Use Topics   Alcohol use: No    Comment: quit in 2008   Drug use: No    Comment: She used to use crack cocaine and  quit about 5 years ago.      Family History: The patient's family history includes Breast cancer in her maternal aunt; Cancer in her maternal aunt, maternal uncle, and maternal uncle; Crohn's disease in her maternal aunt; Diabetes in her maternal aunt, maternal grandmother, and mother; Fibromyalgia in her mother; Heart attack in her father and paternal grandfather; Heart attack (age of onset: 74) in her maternal grandfather; Hyperlipidemia in her mother; Hypertension in her mother; Migraines in her mother; Sleep apnea in her mother.  ROS:   Please see the history of present illness.    All other systems reviewed and are negative.  EKGs/Labs/Other Studies Reviewed:    The following studies were reviewed today:  EKG:  NSR  Imaging studies that I have independently reviewed today: n/a  Recent Labs: No results found for requested labs within last 8760 hours.  Recent Lipid Panel    Component Value Date/Time   CHOL 181 02/20/2014 1021   TRIG 129 02/20/2014 1021   HDL 56 02/20/2014 1021   CHOLHDL 3.2 02/20/2014 1021   VLDL 26 02/20/2014 1021   LDLCALC 99 02/20/2014 1021    Physical Exam:    VS:  BP 124/78   Pulse 76   Ht '5\' 3"'$  (1.6 m)   Wt 229 lb (103.9 kg)   LMP 07/04/2014   SpO2 97%   BMI 40.57 kg/m     Wt Readings from Last 5 Encounters:  04/15/21 229 lb (103.9 kg)  06/29/18 243 lb 12.8 oz (110.6 kg)  07/24/16 223 lb 6.4 oz (101.3 kg)  05/01/16 218 lb 8 oz (99.1 kg)  07/19/14 231 lb 3.2 oz (104.9 kg)    Constitutional: No acute distress Eyes: sclera non-icteric, normal conjunctiva and lids ENMT: normal dentition, moist mucous membranes Cardiovascular: regular rhythm, normal rate, no murmurs. S1 and S2 normal. Radial pulses normal bilaterally. No jugular venous distention.  Respiratory: clear to auscultation bilaterally GI : normal bowel sounds, soft and nontender. No distention.   MSK: extremities warm, well perfused. No edema.  NEURO: grossly nonfocal exam, moves  all extremities. PSYCH: alert and oriented x 3, normal mood and affect.   ASSESSMENT:    1. Precordial pain   2. Primary hypertension   3. Palpitations   4. Hyperlipidemia LDL goal <70    PLAN:    Precordial pain - Plan: Basic metabolic panel, CT CORONARY MORPH W/CTA COR W/SCORE W/CA W/CM &/OR WO/CM, EKG 12-Lead - possible cardiac chest pain, will plan for CCTA for ischemic workup given risk factors of family history, former smoking, HTN, and overweight. ECG  normal today.   Primary hypertension - BP stable today  Palpitations - participated in shared decision making and reviewed prior cardiac monitor. Will observe for now.  Hyperlipidemia LDL goal <70 - will determine if lipid lowering therapy required after CCTA. For now recommend lifestyle modification.   Total time of encounter: 30 minutes total time of encounter, including 20 minutes spent in face-to-face patient care on the date of this encounter. This time includes coordination of care and counseling regarding above mentioned problem list. Remainder of non-face-to-face time involved reviewing chart documents/testing relevant to the patient encounter and documentation in the medical record. I have independently reviewed documentation from referring provider.   Cherlynn Kaiser, MD, [place]   Shared Decision Making/Informed Consent:       Medication Adjustments/Labs and Tests Ordered: Current medicines are reviewed at length with the patient today.  Concerns regarding medicines are outlined above.   Orders Placed This Encounter  Procedures   CT CORONARY MORPH W/CTA COR W/SCORE W/CA W/CM &/OR WO/CM   Basic metabolic panel   EKG [id number]    Meds ordered this encounter  Medications   metoprolol tartrate (LOPRESSOR) 100 MG tablet    Sig: Take 1 tablet (100 mg total) by mouth once for 1 dose. PLEASE TAKE METOPROLOL 2  HOURS PRIOR TO CTA SCAN.    Dispense:  1 tablet    Refill:  0    Patient  Instructions  Medication Instructions:  PLEASE FOLLOW MEDICATION INSTRUCTIONS FOR CORONARY CTA *If you need a refill on your cardiac medications before your next appointment, please call your pharmacy*  Lab Work: [place]  If you have labs (blood work) drawn today and your tests are completely normal, you will receive your results only by: [place] (if you have MyChart) OR A paper copy in the mail If you have any lab test that is abnormal or we need to change your treatment, we will call you to review the results.  Testing/Procedures: Your physician has requested that you have cardiac CT. Cardiac computed tomography (CT) is a painless test that uses an x-ray machine to take clear, detailed pictures of your heart. For further information please visit HugeFiesta.tn. Please follow instruction sheet as given.  Follow-Up: At [facility], you and your health needs are our priority.  As part of our continuing mission to provide you with exceptional heart care, we have created designated Provider Care Teams.  These Care Teams include your primary Cardiologist (physician) and Advanced Practice Providers (APPs -  Physician Assistants and Nurse Practitioners) who all work together to provide you with the care you need, when you need it.  Your next appointment:   October 20th at 11:40am   The format for your next appointment:   In Person  Provider:   Cherlynn Kaiser, MD  Other Instructions WE [place] CTA SCAN     Your cardiac CT will be scheduled at one of the below locations:   [facility] [place], [place] [address] [phone]  If scheduled at [facility], please arrive at the [facility] main entrance (entrance A) of [facility] 30 minutes prior to test start time. Proceed to the [facility] Radiology Department (first floor) to check-in and test  prep.  Please follow these instructions carefully (unless otherwise directed):  On the Night Before the Test: Be sure to Drink plenty of water. Do not  consume any caffeinated/decaffeinated beverages or chocolate 12 hours prior to your test. Do not take any antihistamines 12 hours prior to your test.  On the Day of the Test: Drink plenty of water until 1 hour prior to the test. Do not eat any food 4 hours prior to the test. You may take your regular medications prior to the test.  Take metoprolol (Lopressor) '100mg'$  two hours prior to test. HOLD Furosemide/Hydrochlorothiazide morning of the test. FEMALES- please wear underwire-free bra if available, avoid dresses & tight clothing      After the Test: Drink plenty of water. After receiving IV contrast, you may experience a mild flushed feeling. This is normal. On occasion, you may experience a mild rash up to 24 hours after the test. This is not dangerous. If this occurs, you can take Benadryl 25 mg and increase your fluid intake. If you experience trouble breathing, this can be serious. If it is severe call 911 IMMEDIATELY. If it is mild, please call our office. If you take any of these medications: Glipizide/Metformin, Avandament, Glucavance, please do not take 48 hours after completing test unless otherwise instructed.  Please allow 2-4 weeks for scheduling of routine cardiac CTs. Some insurance companies require a pre-authorization which may delay scheduling of this test.   For non-scheduling related questions, please contact the cardiac imaging nurse navigator should you have any questions/concerns: Marchia Bond, Cardiac Imaging Nurse Navigator Gordy Clement, Cardiac Imaging Nurse Navigator [facility] Direct Office Dial: [phone]   For scheduling needs, including cancellations and rescheduling, please call [place], [phone].

## 2021-04-18 LAB — BASIC METABOLIC PANEL
BUN/Creatinine Ratio: 17 (ref 9–23)
BUN: 9 mg/dL (ref 6–24)
CO2: 25 mmol/L (ref 20–29)
Calcium: 9.5 mg/dL (ref 8.7–10.2)
Chloride: 99 mmol/L (ref 96–106)
Creatinine, Ser: 0.52 mg/dL — ABNORMAL LOW (ref 0.57–1.00)
Glucose: 79 mg/dL (ref 65–99)
Potassium: 4.9 mmol/L (ref 3.5–5.2)
Sodium: 138 mmol/L (ref 134–144)
eGFR: 112 mL/min/{1.73_m2} (ref >59)

## 2021-04-24 ENCOUNTER — Telehealth: Payer: Self-pay | Admitting: Internal Medicine

## 2021-04-24 NOTE — Telephone Encounter (Signed)
  Pt requesting to speak with Eliezer Lofts, she said its about her upcoming CT

## 2021-04-24 NOTE — Telephone Encounter (Signed)
Returned call to patient-discussed upcoming CCTA and medication instructions.  Patient verbalized understanding.

## 2021-04-25 ENCOUNTER — Telehealth (HOSPITAL_COMMUNITY): Payer: Self-pay | Admitting: Emergency Medicine

## 2021-04-25 NOTE — Telephone Encounter (Signed)
Reaching out to patient to offer assistance regarding upcoming cardiac imaging study; pt verbalizes understanding of appt date/time, parking situation and where to check in, pre-test NPO status and medications ordered, and verified current allergies; name and call back number provided for further questions should they arise Marchia Bond RN Navigator Cardiac Imaging [facility] [phone] office [phone] cell  100mg  metoprolol tart Denies iv issues

## 2021-04-25 NOTE — Telephone Encounter (Signed)
Attempted to call patient regarding upcoming cardiac CT appointment. °Left message on voicemail with name and callback number °Chandler Stofer RN Navigator Cardiac Imaging °[facility] °[phone] Office °[phone] Cell ° °

## 2021-04-26 ENCOUNTER — Other Ambulatory Visit: Payer: Self-pay

## 2021-04-26 ENCOUNTER — Ambulatory Visit (HOSPITAL_COMMUNITY)
Admission: RE | Admit: 2021-04-26 | Discharge: 2021-04-26 | Disposition: A | Discharge status: Home / Self Care | Payer: BC Managed Care – PPO | Source: Ambulatory Visit | Attending: Internal Medicine | Admitting: Internal Medicine

## 2021-04-26 DIAGNOSIS — R072 Precordial pain: Secondary | ICD-10-CM

## 2021-04-26 MED ORDER — IOHEXOL 350 MG/ML SOLN
100.0000 mL | Freq: Once | INTRAVENOUS | Status: AC | PRN
  Administered 2021-04-26: 100 mL via INTRAVENOUS

## 2021-04-26 MED ORDER — NITROGLYCERIN 0.4 MG SL SUBL
SUBLINGUAL_TABLET | SUBLINGUAL | Status: AC
  Filled 2021-04-26: qty 2

## 2021-04-26 MED ORDER — NITROGLYCERIN 0.4 MG SL SUBL
0.8000 mg | SUBLINGUAL_TABLET | Freq: Once | SUBLINGUAL | Status: AC
  Administered 2021-04-26: 0.8 mg via SUBLINGUAL

## 2021-05-01 ENCOUNTER — Other Ambulatory Visit: Payer: Self-pay

## 2021-05-01 DIAGNOSIS — IMO0001 Reserved for inherently not codable concepts without codable children: Secondary | ICD-10-CM

## 2021-05-01 DIAGNOSIS — R911 Solitary pulmonary nodule: Secondary | ICD-10-CM

## 2021-05-22 NOTE — Progress Notes (Signed)
Cardiology Office Note:    Date:  05/22/2021   ID:  Lenon Oms, DOB 16-Apr-1971, MRN [id number]  PCP:  Patient, No Pcp Per (Inactive)  Cardiologist:  None  Electrophysiologist:  None   Referring MD: No ref. provider found   Chief complaint: Follow up chest pain, exertional dyspnea and palpitations, fluttering in her chest.  History of Present Illness:    Gloria Hall is a 50 y.o. female with a hx of hypertension, migraines, anxiety, and life stressors who presents today for follow-up of chest pain.   We discussed the results of her cardiac CTA 04/26/21. I have independently reviewed these images. No coronary artery disease, zero calcium score. Reassuring results. Continues to have symptoms but we discussed stress management, BP control, and lifestyle modification.  Today she feels a little fatigued and has a minor headache. She continues to have "pounding" palpitations that she believes is more related to stress.  BP slightly low today. This may be attributable to eating inconsistently due to her busy work schedule. She has also been eating smaller portions and has lost some weight. At work she is constantly on her feet, easily attaining 10k steps a day.  Former smoker as a teenager for a few years, and then later in her 23s for several years.  The patient denies PND, orthopnea, or leg swelling. Denies cough, fever, chills. Denies nausea, vomiting. Denies syncope or presyncope. Denies dizziness or lightheadedness.    Past Medical History:  Diagnosis Date   Anemia    Anxiety    Arthritis    Back pain, chronic    Bronchitis    Hypertension    IBS (irritable bowel syndrome)    LSIL (low grade squamous intraepithelial lesion) on Pap smear 08/21/2011   Migraines    Palpitations    Peripheral neuropathic pain    in hands    Past Surgical History:  Procedure Laterality Date   DILATION AND CURETTAGE OF UTERUS     dilation and curretage     after a miscarriage   ENDOMETRIAL  ABLATION     KNEE ARTHROSCOPY Left 12-01-13   LAPAROSCOPY  03/19/2011   Procedure: LAPAROSCOPY OPERATIVE;  Surgeon: Juliene Pina C. Hulan Fray, MD;  Location: [facility];  Service: Gynecology;  Laterality: N/A;   NOVASURE ABLATION  03/19/2011   Procedure: NOVASURE ABLATION;  Surgeon: Juliene Pina C. Hulan Fray, MD;  Location: [facility];  Service: Gynecology;  Laterality: N/A;   TUBAL LIGATION  1993    Current Medications: Current Meds  Medication Sig   ALPRAZolam (XANAX) 0.5 MG tablet Take 0.5 mg by mouth daily as needed.   Cholecalciferol (VITAMIN D) 2000 units CAPS Take 1 capsule by mouth daily.   diclofenac Sodium (VOLTAREN) 1 % GEL Apply 2 g topically 4 (four) times daily.   levothyroxine (SYNTHROID) 25 MCG tablet levothyroxine 25 mcg tablet  Take 1 tablet by mouth once daily   Magnesium 500 MG CAPS Take 1 capsule by mouth daily.   omeprazole (PRILOSEC) 20 MG capsule Take 1 capsule (20 mg total) by mouth daily.   ondansetron (ZOFRAN) 8 MG tablet ondansetron HCl 8 mg tablet   PARoxetine (PAXIL) 10 MG tablet Take 10 mg by mouth daily.   Semaglutide, 1 MG/DOSE, (OZEMPIC, 1 MG/DOSE,) 4 MG/3ML SOPN Ozempic 1 mg/dose (4 mg/3 mL) subcutaneous pen injector   vitamin E 180 MG (400 UNITS) capsule Take 400 Units by mouth daily.     Allergies:   Imitrex [sumatriptan]   Social History   Socioeconomic History  Marital status: Married    Spouse name: Yarissa Reining   Number of children: 3   Years of education: 12th   Highest education level: Not on file  Occupational History    Employer: [employer]    Comment: [employer]  Tobacco Use   Smoking status: Former    Packs/day: 1.50    Years: 10.00    Pack years: 15.00    Types: Cigarettes    Quit date: 05/03/2009    Years since quitting: 12.0   Smokeless tobacco: Never  Vaping Use   Vaping Use: Never used  Substance and Sexual Activity   Alcohol use: No    Comment: quit in 2008   Drug use: No    Comment: 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    Sexual activity: Yes    Birth control/protection: Surgical  Other Topics Concern   Not on file  Social History Narrative   Patient lives at home with spouse.   Caffeine Use: 2 cups of coffee and 2 cups of tea daily   Social Determinants of Health   Financial Resource Strain: Not on file  Food Insecurity: Not on file  Transportation Needs: Not on file  Physical Activity: Not on file  Stress: Not on file  Social Connections: Not on file     Family History: The patient's family history includes Breast cancer in her maternal aunt; Cancer in her maternal aunt, maternal uncle, and maternal uncle; Crohn's disease in her maternal aunt; Diabetes in her maternal aunt, maternal grandmother, and mother; Fibromyalgia in her mother; Heart attack in her father and paternal grandfather; Heart attack (age of onset: 59) in her maternal grandfather; Hyperlipidemia in her mother; Hypertension in her mother; Migraines in her mother; Sleep apnea in her mother.  ROS:   Please see the history of present illness.    (+) Palpitations (+) Stress (+) Fatigue (+) Headache All other systems reviewed and are negative.  EKGs/Labs/Other Studies Reviewed:    The following studies were reviewed today:  Cardiac CTA 04/26/2021: FINDINGS: A 100 kV prospective scan was triggered in the descending thoracic aorta at 111 HU's. Axial non-contrast 3 mm slices were carried out through the heart. The data set was analyzed on a dedicated work station and scored using the [place]. Gantry rotation speed was 250 msecs and collimation was .6 mm. 0.8 mg of sl NTG was given. The 3D data set was reconstructed in 5% intervals of the 67-82 % of the R-R cycle. Diastolic phases were analyzed on a dedicated work station using MPR, MIP and VRT modes. The patient received 80 cc of contrast.   Aorta:  Normal size.  No calcifications.  No dissection.   Aortic Valve: No  calcifications.   Coronary Arteries:  Normal coronary origin.  Right dominance.   RCA is a large dominant artery that gives rise to PDA and PLA. There is no plaque.   Left main is a large artery that gives rise to LAD and LCX arteries.   LAD is a large vessel that has no plaque.   LCX is a non-dominant artery that gives rise to one large OM1 branch. There is no plaque.   Other findings:   Normal pulmonary vein drainage into the left atrium.   Normal left atrial appendage without a thrombus.   Normal size of the pulmonary artery.   Please see radiology report for non cardiac findings.  IMPRESSION: 1. Coronary calcium score of 0. This was 0 percentile for age and sex matched control.   2. Normal coronary origin with right dominance.   3. No evidence of CAD.   CAD-RADS 0. No evidence of CAD (0%). Consider non-atherosclerotic causes of chest pain.   Echo TTE 07/15/2018: Study Conclusions   - Left ventricle: The cavity size was normal. Wall thickness was    normal. Systolic function was normal. The estimated ejection    fraction was in the range of 60% to 65%. Wall motion was normal;    there were no regional wall motion abnormalities. Left    ventricular diastolic function parameters were normal for the    patient&'s age.   Monitor 07/15/2018: Indication: palpitations   Supraventricular Ectopy: rare SVT: three episodes lasting 4-5 beats, max rate of 152 bpm. Ventricular Ectopy: rare   NSVT: none Ventricular Tachycardia: none   Pauses: none AV block: none   Atrial fibrillation: none   Diary events: none    IMPRESSION: rare supraventricular and ventricular ectopy. Brief, infrequent episodes of SVT (3 episodes, <5 bpm).   EKG:  n/a 04/15/21 - NSR Recent Labs: 04/18/2021: BUN 9; Creatinine, Ser 0.52; Potassium 4.9; Sodium 138  Recent Lipid Panel    Component Value Date/Time   CHOL 181 02/20/2014 1021   TRIG 129 02/20/2014 1021   HDL 56 02/20/2014 1021    CHOLHDL 3.2 02/20/2014 1021   VLDL 26 02/20/2014 1021   LDLCALC 99 02/20/2014 1021    Physical Exam:    VS:  BP 98/67   Pulse 86   Ht 5\' 3"  (1.6 m)   Wt 222 lb 9.6 oz (101 kg)   LMP 07/04/2014   SpO2 99%   BMI 39.43 kg/m     Wt Readings from Last 3 Encounters:  04/15/21 229 lb (103.9 kg)  06/29/18 243 lb 12.8 oz (110.6 kg)  07/24/16 223 lb 6.4 oz (101.3 kg)    Constitutional: No acute distress Eyes: pupils equally round and reactive to light, sclera non-icteric, normal conjunctiva and lids ENMT: normal dentition, moist mucous membranes Cardiovascular: regular rhythm, normal rate, no murmurs. S1 and S2 normal. Radial pulses normal bilaterally. No jugular venous distention.  Respiratory: clear to auscultation bilaterally GI : normal bowel sounds, soft and nontender. No distention.   MSK: extremities warm, well perfused. No edema.  NEURO: grossly nonfocal exam, moves all extremities. PSYCH: alert and oriented x 3, normal mood and affect.   ASSESSMENT:    1. Precordial pain   2. Primary hypertension   3. Palpitations   4. Hyperlipidemia LDL goal <70     PLAN:    Precordial pain -  - CCTA normal. Pain may be noncardiac however will observe if related to BP or stress. Can consider antianginals as well for possible microvascular angina.   Primary hypertension - BP stable, no additional therapy required at this time.    Palpitations - no worsening, likely related to stress.   Hyperlipidemia LDL goal <70 - participated in shared decision making, determined we will puruse diet and lifestyle for now as noted below. Exercise recommendations: Goal of exercising for at least 30 minutes a day, at least 5 times per week.  Please exercise to a moderate exertion.  This means that while exercising it is difficult to speak in full sentences, however you are not so short of breath that you feel you must stop, and not so comfortable that you can carry on a full conversation.  Exertion level should be approximately a 5/10, if 10 is the most exertion you can perform.  Diet recommendations: Recommend a heart healthy diet such as the Mediterranean diet.  This diet consists of plant based foods, healthy fats, lean meats, olive oil.  It suggests limiting the intake of simple carbohydrates such as white breads, pastries, and pastas.  It also limits the amount of red meat, wine, and dairy products such as cheese that one should consume on a daily basis.   Total time of encounter: 30 minutes total time of encounter, including 20 minutes spent in face-to-face patient care on the date of this encounter. This time includes coordination of care and counseling regarding above mentioned problem list. Remainder of non-face-to-face time involved reviewing chart documents/testing relevant to the patient encounter and documentation in the medical record. I have independently reviewed documentation from referring provider.   Cherlynn Kaiser, MD, [facility]    Medication Adjustments/Labs and Tests Ordered: Current medicines are reviewed at length with the patient today.  Concerns regarding medicines are outlined above.  No orders of the defined types were placed in this encounter.  No orders of the defined types were placed in this encounter.   Patient Instructions  Medication Instructions:  Your Physician recommend you continue on your current medication as directed.    *If you need a refill on your cardiac medications before your next appointment, please call your pharmacy*   Lab Work: None ordered  If you have labs (blood work) drawn today and your tests are completely normal, you will receive your results only by: [place] (if you have MyChart) OR A paper copy in the mail If you have any lab test that is abnormal or we need to change your treatment, we will call you to review the results.   Testing/Procedures: None  ordered   Follow-Up: At [facility], you and your health needs are our priority.  As part of our continuing mission to provide you with exceptional heart care, we have created designated Provider Care Teams.  These Care Teams include your primary Cardiologist (physician) and Advanced Practice Providers (APPs -  Physician Assistants and Nurse Practitioners) who all work together to provide you with the care you need, when you need it.  We recommend signing up for the patient portal called "MyChart".  Sign up information is provided on this After Visit Summary.  MyChart is used to connect with patients for Virtual Visits (Telemedicine).  Patients are able to view lab/test results, encounter notes, upcoming appointments, etc.  Non-urgent messages can be sent to your provider as well.   To learn more about what you can do with MyChart, go to [email or web address].    Your next appointment:   6 month(s)  The format for your next appointment:   In Person  Provider:   Cherlynn Kaiser, MD     [facility] Stumpf,acting as a scribe for Gloria Munroe, MD.,have documented all relevant documentation on the behalf of Gloria Munroe, MD,as directed by  Gloria Munroe, MD while in the presence of Gloria Munroe, MD.  I, Gloria Munroe, MD, have reviewed all documentation for this visit. The documentation on today's date of service for the exam, diagnosis, procedures, and orders are all accurate and complete.

## 2021-05-23 ENCOUNTER — Ambulatory Visit (INDEPENDENT_AMBULATORY_CARE_PROVIDER_SITE_OTHER): Payer: BC Managed Care – PPO | Admitting: Internal Medicine

## 2021-05-23 ENCOUNTER — Other Ambulatory Visit: Payer: Self-pay | Admitting: Internal Medicine

## 2021-05-23 ENCOUNTER — Other Ambulatory Visit: Payer: Self-pay

## 2021-05-23 VITALS — BP 98/67 | HR 86 | Ht 63.0 in | Wt 222.6 lb

## 2021-05-23 DIAGNOSIS — I10 Essential (primary) hypertension: Secondary | ICD-10-CM

## 2021-05-23 DIAGNOSIS — E785 Hyperlipidemia, unspecified: Secondary | ICD-10-CM | POA: Diagnosis not present

## 2021-05-23 DIAGNOSIS — R002 Palpitations: Secondary | ICD-10-CM

## 2021-05-23 DIAGNOSIS — R072 Precordial pain: Secondary | ICD-10-CM | POA: Diagnosis not present

## 2021-05-23 DIAGNOSIS — R911 Solitary pulmonary nodule: Secondary | ICD-10-CM

## 2021-05-23 DIAGNOSIS — IMO0001 Reserved for inherently not codable concepts without codable children: Secondary | ICD-10-CM

## 2021-05-23 NOTE — Patient Instructions (Signed)
Medication Instructions:  Your Physician recommend you continue on your current medication as directed.    *If you need a refill on your cardiac medications before your next appointment, please call your pharmacy*   Lab Work: None ordered  If you have labs (blood work) drawn today and your tests are completely normal, you will receive your results only by: [place] (if you have MyChart) OR A paper copy in the mail If you have any lab test that is abnormal or we need to change your treatment, we will call you to review the results.   Testing/Procedures: None ordered   Follow-Up: At [facility], you and your health needs are our priority.  As part of our continuing mission to provide you with exceptional heart care, we have created designated Provider Care Teams.  These Care Teams include your primary Cardiologist (physician) and Advanced Practice Providers (APPs -  Physician Assistants and Nurse Practitioners) who all work together to provide you with the care you need, when you need it.  We recommend signing up for the patient portal called "MyChart".  Sign up information is provided on this After Visit Summary.  MyChart is used to connect with patients for Virtual Visits (Telemedicine).  Patients are able to view lab/test results, encounter notes, upcoming appointments, etc.  Non-urgent messages can be sent to your provider as well.   To learn more about what you can do with MyChart, go to [email or web address].    Your next appointment:   6 month(s)  The format for your next appointment:   In Person  Provider:   Cherlynn Kaiser, MD

## 2021-12-19 ENCOUNTER — Other Ambulatory Visit: Payer: Self-pay

## 2021-12-19 DIAGNOSIS — E785 Hyperlipidemia, unspecified: Secondary | ICD-10-CM

## 2022-01-03 ENCOUNTER — Ambulatory Visit: Payer: BC Managed Care – PPO | Admitting: Adult Health

## 2022-01-16 NOTE — Progress Notes (Signed)
Cardiology Clinic Note   Patient Name: Gloria Hall Date of Encounter: 01/17/2022  Primary Care Provider:  Patient, No Pcp Per Primary Cardiologist:  Cherlynn Kaiser MD  Patient Profile    51 year old female with history of hypertension, anxiety, palpitations, chest discomfort, and chronic migraines, anemia, chronic back pain, IBS, and peripheral neuropathic pain in hands.  Cardiac CTA on 04/26/2021 revealed no coronary artery disease with 0 calcium score.  Past Medical History    Past Medical History:  Diagnosis Date   Anemia    Anxiety    Arthritis    Back pain, chronic    Bronchitis    Hypertension    IBS (irritable bowel syndrome)    LSIL (low grade squamous intraepithelial lesion) on Pap smear 08/21/2011   Migraines    Palpitations    Peripheral neuropathic pain    in hands   Past Surgical History:  Procedure Laterality Date   DILATION AND CURETTAGE OF UTERUS     dilation and curretage     after a miscarriage   ENDOMETRIAL ABLATION     KNEE ARTHROSCOPY Left 12-01-13   LAPAROSCOPY  03/19/2011   Procedure: LAPAROSCOPY OPERATIVE;  Surgeon: Juliene Pina C. Hulan Fray, MD;  Location: [facility];  Service: Gynecology;  Laterality: N/A;   NOVASURE ABLATION  03/19/2011   Procedure: NOVASURE ABLATION;  Surgeon: Juliene Pina C. Hulan Fray, MD;  Location: [facility];  Service: Gynecology;  Laterality: N/A;   TUBAL LIGATION  1993    Allergies  Allergies  Allergen Reactions   Imitrex [Sumatriptan] Other (See Comments)    Throat tightness x 15 minutes; no swelling or itching    History of Present Illness    Mrs. Leach presents today for ongoing assessment and management of hypertension, chronic precordial pain, palpitations, and hyperlipidemia with goal less than 70.  Last seen by Dr. Margaretann Loveless on 05/23/2021, with lifestyle modifications, weight loss, and increased exercise recommended.   She comes today without any cardiac complaints.  She has chronic arthritis pain.  She is very busy, taking care of 2  grandchildren, working full-time, and managing her home.  She has had recent labs completed by the physicians where she works at [employer].  This includes lipid study.  Total cholesterol 208, LDL 134, HDL 56.  She was advised to follow-up with cardiology for advisement.  Of note her vitamin D level was low at 18.1 despite vitamin D supplements.  Home Medications    Current Outpatient Medications  Medication Sig Dispense Refill   ALPRAZolam (XANAX) 0.5 MG tablet Take 0.5 mg by mouth daily as needed.     celecoxib (CELEBREX) 200 MG capsule Take 200 mg by mouth daily.     Cholecalciferol (VITAMIN D) 2000 units CAPS Take 1 capsule by mouth daily.     levothyroxine (SYNTHROID) 25 MCG tablet levothyroxine 25 mcg tablet  Take 1 tablet by mouth once daily     Magnesium 500 MG CAPS Take 1 capsule by mouth daily.     omeprazole (PRILOSEC) 20 MG capsule Take 1 capsule (20 mg total) by mouth daily. 90 capsule 3   ondansetron (ZOFRAN) 8 MG tablet ondansetron HCl 8 mg tablet     PARoxetine (PAXIL) 10 MG tablet Take 30 mg by mouth daily.     diclofenac Sodium (VOLTAREN) 1 % GEL Apply 2 g topically 4 (four) times daily. (Patient not taking: Reported on 01/17/2022) 100 g 0   ibuprofen (ADVIL,MOTRIN) 800 MG tablet Take 1 tablet (800 mg total) by mouth 3 (three) times  daily. (Patient not taking: Reported on 05/23/2021) 21 tablet 0   Semaglutide, 1 MG/DOSE, (OZEMPIC, 1 MG/DOSE,) 4 MG/3ML SOPN Ozempic 1 mg/dose (4 mg/3 mL) subcutaneous pen injector (Patient not taking: Reported on 01/17/2022)     vitamin E 180 MG (400 UNITS) capsule Take 400 Units by mouth daily. (Patient not taking: Reported on 01/17/2022)     No current facility-administered medications for this visit.     Family History    Family History  Problem Relation Age of Onset   Diabetes Mother        48   Hypertension Mother    Hyperlipidemia Mother    Fibromyalgia Mother    Sleep apnea Mother    Migraines Mother    Heart attack Father         50   Diabetes Maternal Aunt    Cancer Maternal Aunt        breast, metastatic cancer throughout body   Breast cancer Maternal Aunt    Crohn's disease Maternal Aunt    Cancer Maternal Uncle        leukemia   Diabetes Maternal Grandmother    Cancer Maternal Uncle        colon   Heart attack Maternal Grandfather 59   Heart attack Paternal Grandfather    She indicated that her mother is alive. She indicated that her father is alive. She indicated that the status of her maternal grandmother is unknown. She indicated that her maternal grandfather is deceased. She indicated that her paternal grandmother is deceased. She indicated that her paternal grandfather is deceased. She indicated that the status of her maternal aunt is unknown.  Social History    Social History   Socioeconomic History   Marital status: Married    Spouse name: Jovon Streetman   Number of children: 3   Years of education: 12th   Highest education level: Not on file  Occupational History    Employer: [employer]    Comment: [employer]  Tobacco Use   Smoking status: Former    Packs/day: 1.50    Years: 10.00    Total pack years: 15.00    Types: Cigarettes    Quit date: 05/03/2009    Years since quitting: 12.7   Smokeless tobacco: Never  Vaping Use   Vaping Use: Never used  Substance and Sexual Activity   Alcohol use: No    Comment: quit in 2008   Drug use: No    Comment: 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    Sexual activity: Yes    Birth control/protection: Surgical  Other Topics Concern   Not on file  Social History Narrative   Patient lives at home with spouse.   Caffeine Use: 2 cups of coffee and 2 cups of tea daily   Social Determinants of Health   Financial Resource Strain: Not on file  Food Insecurity: Not on file  Transportation Needs: Not on file  Physical Activity: Not on file  Stress: Not on file  Social Connections:  Not on file  Intimate Partner Violence: Not on file     Review of Systems    General:  No chills, fever, night sweats or weight changes.  Cardiovascular:  No chest pain, dyspnea on exertion, edema, orthopnea, palpitations, paroxysmal nocturnal dyspnea. Dermatological: No rash, lesions/masses Respiratory: No cough, dyspnea Urologic: No hematuria, dysuria Abdominal:   No nausea, vomiting, diarrhea, bright red blood per  rectum, melena, or hematemesis Neurologic:  No visual changes, wkns, changes in mental status. All other systems reviewed and are otherwise negative except as noted above.     Physical Exam    VS:  BP 122/80 (BP Location: Left Arm)   Pulse 84   Ht 5' 3.25" (1.607 m)   Wt 243 lb 12.8 oz (110.6 kg)   LMP 07/04/2014   SpO2 96%   BMI 42.85 kg/m  , BMI Body mass index is 42.85 kg/m.     GEN: Well nourished, well developed, in no acute distress. HEENT: normal. Neck: Supple, no JVD, carotid bruits, or masses. Cardiac: RRR, no murmurs, rubs, or gallops. No clubbing, cyanosis, edema.  Radials/DP/PT 2+ and equal bilaterally.  Respiratory:  Respirations regular and unlabored, clear to auscultation bilaterally. GI: Soft, nontender, nondistended, BS + x 4. MS: no deformity or atrophy. Skin: warm and dry, no rash. Neuro:  Strength and sensation are intact. Psych: Normal affect.  Accessory Clinical Findings     Lab Results  Component Value Date   WBC 12.9 (H) 08/04/2017   HGB 11.5 (L) 08/04/2017   HCT 37.0 08/04/2017   MCV 81.0 08/04/2017   PLT 331 08/04/2017   Lab Results  Component Value Date   CREATININE 0.52 (L) 04/18/2021   BUN 9 04/18/2021   NA 138 04/18/2021   K 4.9 04/18/2021   CL 99 04/18/2021   CO2 25 04/18/2021   Lab Results  Component Value Date   ALT 10 10/05/2012   AST 15 10/05/2012   ALKPHOS 70 10/05/2012   BILITOT 0.5 10/05/2012   Lab Results  Component Value Date   CHOL 181 02/20/2014   HDL 56 02/20/2014   LDLCALC 99 02/20/2014    TRIG 129 02/20/2014   CHOLHDL 3.2 02/20/2014    Lab Results  Component Value Date   HGBA1C 5.6 02/17/2014    Review of Prior Studies: Cardiac CTA 04/26/2021: FINDINGS: A 100 kV prospective scan was triggered in the descending thoracic aorta at 111 HU's. Axial non-contrast 3 mm slices were carried out through the heart. The data set was analyzed on a dedicated work station and scored using the [place]. Gantry rotation speed was 250 msecs and collimation was .6 mm. 0.8 mg of sl NTG was given. The 3D data set was reconstructed in 5% intervals of the 67-82 % of the R-R cycle. Diastolic phases were analyzed on a dedicated work station using MPR, MIP and VRT modes. The patient received 80 cc of contrast.   Aorta:  Normal size.  No calcifications.  No dissection.   Aortic Valve: No calcifications.   Coronary Arteries:  Normal coronary origin.  Right dominance.   RCA is a large dominant artery that gives rise to PDA and PLA. There is no plaque.   Left main is a large artery that gives rise to LAD and LCX arteries.   LAD is a large vessel that has no plaque.   LCX is a non-dominant artery that gives rise to one large OM1 branch. There is no plaque.   Other findings:   Normal pulmonary vein drainage into the left atrium.   Normal left atrial appendage without a thrombus.   Normal size of the pulmonary artery.   Please see radiology report for non cardiac findings.   IMPRESSION: 1. Coronary calcium score of 0. This was 0 percentile for age and sex matched control.   2. Normal coronary origin with right dominance.   3. No evidence of  CAD.   CAD-RADS 0. No evidence of CAD (0%). Consider non-atherosclerotic causes of chest pain.    Echo TTE 07/15/2018: Study Conclusions    - Left ventricle: The cavity size was normal. Wall thickness was    normal. Systolic function was normal. The estimated ejection    fraction was in the range of 60% to 65%. Wall motion was  normal;    there were no regional wall motion abnormalities. Left    ventricular diastolic function parameters were normal for the    patient&'s age.    Monitor 07/15/2018: Indication: palpitations   Supraventricular Ectopy: rare SVT: three episodes lasting 4-5 beats, max rate of 152 bpm. Ventricular Ectopy: rare   NSVT: none Ventricular Tachycardia: none   Pauses: none AV block: none   Atrial fibrillation: none   Diary events: none    IMPRESSION: rare supraventricular and ventricular ectopy. Brief, infrequent episodes of SVT (3 episodes, <5 bpm).    Assessment & Plan   1.  Hypercholesterolemia: Despite diet and exercise the patient cholesterol status, especially LDL, is elevated.  I will begin her on Zetia 10 mg daily.  I have advised her that this may cause a little bit of diarrhea when she first begins taking it.  She verbalizes understanding.  I will give her 30-day prescription and have her let us know how she is feeling via patient message.  If she tolerates it okay then we will give her a 90-day supply and repeat her fasting lipids and LFTs in 3 months.  2.  Hypertension: Currently well controlled.  No additional medications prescribed.    Current medicines are reviewed at length with the patient today.  I have spent 25 min's  dedicated to the care of this patient on the date of this encounter to include pre-visit review of records, assessment, management and diagnostic testing,with shared decision making. Signed, Phill Myron. West Pugh, ANP, AACC   01/17/2022 2:51 PM    [facility] [address] Office [phone] Fax [phone]  Notice: This dictation was prepared with Dragon dictation along with smaller phrase technology. Any transcriptional errors that result from this process are unintentional and may not be corrected upon review.

## 2022-01-17 ENCOUNTER — Encounter: Payer: Self-pay | Admitting: Adult Health

## 2022-01-17 ENCOUNTER — Ambulatory Visit (INDEPENDENT_AMBULATORY_CARE_PROVIDER_SITE_OTHER): Payer: 59 | Admitting: Adult Health

## 2022-01-17 VITALS — BP 122/80 | HR 84 | Ht 63.25 in | Wt 243.8 lb

## 2022-01-17 DIAGNOSIS — I10 Essential (primary) hypertension: Secondary | ICD-10-CM | POA: Diagnosis not present

## 2022-01-17 DIAGNOSIS — E7800 Pure hypercholesterolemia, unspecified: Secondary | ICD-10-CM

## 2022-01-17 MED ORDER — EZETIMIBE 10 MG PO TABS: 10.0000 mg | ORAL_TABLET | Freq: Every day | ORAL | 0 refills | Status: DC

## 2022-01-17 NOTE — Patient Instructions (Addendum)
Medication Instructions:  Start Zetia 10 mg ( Take 1 Tablet Daily). *If you need a refill on your cardiac medications before your next appointment, please call your pharmacy*   Lab Work: No Labs If you have labs (blood work) drawn today and your tests are completely normal, you will receive your results only by: [facility] (if you have MyChart) OR A paper copy in the mail If you have any lab test that is abnormal or we need to change your treatment, we will call you to review the results.   Testing/Procedures: No Testing   Follow-Up: At [facility], you and your health needs are our priority.  As part of our continuing mission to provide you with exceptional heart care, we have created designated Provider Care Teams.  These Care Teams include your primary Cardiologist (physician) and Advanced Practice Providers (APPs -  Physician Assistants and Nurse Practitioners) who all work together to provide you with the care you need, when you need it.  We recommend signing up for the patient portal called "MyChart".  Sign up information is provided on this After Visit Summary.  MyChart is used to connect with patients for Virtual Visits (Telemedicine).  Patients are able to view lab/test results, encounter notes, upcoming appointments, etc.  Non-urgent messages can be sent to your provider as well.   To learn more about what you can do with MyChart, go to [email or web address].    Your next appointment:   6 month(s)  The format for your next appointment:   In Person  Provider:   Cherlynn Kaiser, MD    :1}      Important Information About Sugar

## 2022-02-06 ENCOUNTER — Other Ambulatory Visit: Payer: Self-pay | Admitting: Adult Health

## 2022-03-25 ENCOUNTER — Ambulatory Visit (HOSPITAL_COMMUNITY): Payer: 59

## 2022-04-25 ENCOUNTER — Inpatient Hospital Stay: Admission: RE | Admit: 2022-04-25 | Payer: BC Managed Care – PPO | Source: Ambulatory Visit

## 2022-06-28 ENCOUNTER — Telehealth: Payer: 59 | Admitting: Physician Assistant

## 2022-06-28 DIAGNOSIS — J0300 Acute streptococcal tonsillitis, unspecified: Secondary | ICD-10-CM | POA: Diagnosis not present

## 2022-06-28 MED ORDER — AMOXICILLIN 500 MG PO CAPS: 500.0000 mg | ORAL_CAPSULE | Freq: Two times a day (BID) | ORAL | 0 refills | Status: AC

## 2022-06-28 NOTE — Progress Notes (Signed)
Virtual Visit Consent   Gloria Hall, you are scheduled for a virtual visit with a [facility] provider today. Just as with appointments in the office, your consent must be obtained to participate. Your consent will be active for this visit and any virtual visit you may have with one of our providers in the next 365 days. If you have a MyChart account, a copy of this consent can be sent to you electronically.  As this is a virtual visit, video technology does not allow for your provider to perform a traditional examination. This may limit your provider's ability to fully assess your condition. If your provider identifies any concerns that need to be evaluated in person or the need to arrange testing (such as labs, EKG, etc.), we will make arrangements to do so. Although advances in technology are sophisticated, we cannot ensure that it will always work on either your end or our end. If the connection with a video visit is poor, the visit may have to be switched to a telephone visit. With either a video or telephone visit, we are not always able to ensure that we have a secure connection.  By engaging in this virtual visit, you consent to the provision of healthcare and authorize for your insurance to be billed (if applicable) for the services provided during this visit. Depending on your insurance coverage, you may receive a charge related to this service.  I need to obtain your verbal consent now. Are you willing to proceed with your visit today? Gloria Hall has provided verbal consent on 06/28/2022 for a virtual visit (video or telephone). Mar Daring, PA-C  Date: 06/28/2022 12:29 PM  Virtual Visit via Video Note   I, Mar Daring, connected with  Gloria Hall  ([id number], 04-30-1971) on 06/28/22 at 12:45 PM EST by a video-enabled telemedicine application and verified that I am speaking with the correct person using two identifiers.  Location: Patient: Virtual Visit Location  Patient: Home Provider: Virtual Visit Location Provider: Home Office   I discussed the limitations of evaluation and management by telemedicine and the availability of in person appointments. The patient expressed understanding and agreed to proceed.    History of Present Illness: Gloria Hall is a 51 y.o. who identifies as a female who was assigned female at birth, and is being seen today for sore throat.  HPI: Sore Throat  This is a new problem. The problem has been gradually worsening. There has been no fever. The pain is moderate. Associated symptoms include congestion, ear pain, headaches, a plugged ear sensation, swollen glands and trouble swallowing. Pertinent negatives include no coughing, diarrhea, ear discharge, hoarse voice, shortness of breath or vomiting. Associated symptoms comments: Nausea yesterday, chills and shivering yesterday. Exposure to: unknown but has 4 kids at home that go to school and day care. She has tried acetaminophen for the symptoms. The treatment provided no relief.   At home covid testing is negative   Problems:  Patient Active Problem List   Diagnosis Date Noted   Palpitations    Hypertension    Anemia    Perimenopausal vasomotor symptoms 05/01/2016   DUB (dysfunctional uterine bleeding) 07/19/2014   Bilateral carpal tunnel syndrome 04/12/2014   Facial cellulitis 05/03/2012   Anxiety 05/03/2012   LSIL (low grade squamous intraepithelial lesion) on Pap smear 08/21/2011    Allergies:  Allergies  Allergen Reactions   Imitrex [Sumatriptan] Other (See Comments)    Throat tightness x 15 minutes; no swelling or itching  Medications:  Current Outpatient Medications:    ALPRAZolam (XANAX) 0.5 MG tablet, Take 0.5 mg by mouth daily as needed., Disp: , Rfl:    amoxicillin (AMOXIL) 500 MG capsule, Take 1 capsule (500 mg total) by mouth 2 (two) times daily for 10 days., Disp: 20 capsule, Rfl: 0   celecoxib (CELEBREX) 200 MG capsule, Take 200 mg by mouth  daily., Disp: , Rfl:    Cholecalciferol (VITAMIN D) 2000 units CAPS, Take 1 capsule by mouth daily., Disp: , Rfl:    diclofenac Sodium (VOLTAREN) 1 % GEL, Apply 2 g topically 4 (four) times daily. (Patient not taking: Reported on 01/17/2022), Disp: 100 g, Rfl: 0   ezetimibe (ZETIA) 10 MG tablet, Take 1 tablet by mouth once daily, Disp: 90 tablet, Rfl: 3   ibuprofen (ADVIL,MOTRIN) 800 MG tablet, Take 1 tablet (800 mg total) by mouth 3 (three) times daily. (Patient not taking: Reported on 05/23/2021), Disp: 21 tablet, Rfl: 0   levothyroxine (SYNTHROID) 25 MCG tablet, levothyroxine 25 mcg tablet  Take 1 tablet by mouth once daily, Disp: , Rfl:    Magnesium 500 MG CAPS, Take 1 capsule by mouth daily., Disp: , Rfl:    omeprazole (PRILOSEC) 20 MG capsule, Take 1 capsule (20 mg total) by mouth daily., Disp: 90 capsule, Rfl: 3   ondansetron (ZOFRAN) 8 MG tablet, ondansetron HCl 8 mg tablet, Disp: , Rfl:    PARoxetine (PAXIL) 10 MG tablet, Take 30 mg by mouth daily., Disp: , Rfl:    Semaglutide, 1 MG/DOSE, (OZEMPIC, 1 MG/DOSE,) 4 MG/3ML SOPN, Ozempic 1 mg/dose (4 mg/3 mL) subcutaneous pen injector (Patient not taking: Reported on 01/17/2022), Disp: , Rfl:    vitamin E 180 MG (400 UNITS) capsule, Take 400 Units by mouth daily. (Patient not taking: Reported on 01/17/2022), Disp: , Rfl:   Observations/Objective: Patient is well-developed, well-nourished in no acute distress.  Resting comfortably at home.  Head is normocephalic, atraumatic.  No labored breathing.  Speech is clear and coherent with logical content.  Patient is alert and oriented at baseline.  Reports tonsils are swollen and red, but no exudate noted at this time  Assessment and Plan: 1. Strep tonsillitis - amoxicillin (AMOXIL) 500 MG capsule; Take 1 capsule (500 mg total) by mouth 2 (two) times daily for 10 days.  Dispense: 20 capsule; Refill: 0  - Suspect strep throat - Amoxicillin prescribed - Tylenol and Ibuprofen alternating every 4  hours - Salt water gargles - Chloraseptic spray - Liquid and soft food diet - Push fluids - New toothbrush in 3 days - Seek in person evaluation if not improving or if symptoms worsen   Follow Up Instructions: I discussed the assessment and treatment plan with the patient. The patient was provided an opportunity to ask questions and all were answered. The patient agreed with the plan and demonstrated an understanding of the instructions.  A copy of instructions were sent to the patient via MyChart unless otherwise noted below.    The patient was advised to call back or seek an in-person evaluation if the symptoms worsen or if the condition fails to improve as anticipated.  Time:  I spent 8 minutes with the patient via telehealth technology discussing the above problems/concerns.    Mar Daring, PA-C

## 2022-06-28 NOTE — Patient Instructions (Signed)
Lenon Oms, thank you for joining Mar Daring, PA-C for today's virtual visit.  While this provider is not your primary care provider (PCP), if your PCP is located in our provider database this encounter information will be shared with them immediately following your visit.   [facility] account gives you access to today's visit and all your visits, tests, and labs performed at [facility] " click here if you don't have a [facility] account or go to [email or web address]  Consent: (Patient) Anayi Bricco provided verbal consent for this virtual visit at the beginning of the encounter.  Current Medications:  Current Outpatient Medications:    ALPRAZolam (XANAX) 0.5 MG tablet, Take 0.5 mg by mouth daily as needed., Disp: , Rfl:    amoxicillin (AMOXIL) 500 MG capsule, Take 1 capsule (500 mg total) by mouth 2 (two) times daily for 10 days., Disp: 20 capsule, Rfl: 0   celecoxib (CELEBREX) 200 MG capsule, Take 200 mg by mouth daily., Disp: , Rfl:    Cholecalciferol (VITAMIN D) 2000 units CAPS, Take 1 capsule by mouth daily., Disp: , Rfl:    diclofenac Sodium (VOLTAREN) 1 % GEL, Apply 2 g topically 4 (four) times daily. (Patient not taking: Reported on 01/17/2022), Disp: 100 g, Rfl: 0   ezetimibe (ZETIA) 10 MG tablet, Take 1 tablet by mouth once daily, Disp: 90 tablet, Rfl: 3   ibuprofen (ADVIL,MOTRIN) 800 MG tablet, Take 1 tablet (800 mg total) by mouth 3 (three) times daily. (Patient not taking: Reported on 05/23/2021), Disp: 21 tablet, Rfl: 0   levothyroxine (SYNTHROID) 25 MCG tablet, levothyroxine 25 mcg tablet  Take 1 tablet by mouth once daily, Disp: , Rfl:    Magnesium 500 MG CAPS, Take 1 capsule by mouth daily., Disp: , Rfl:    omeprazole (PRILOSEC) 20 MG capsule, Take 1 capsule (20 mg total) by mouth daily., Disp: 90 capsule, Rfl: 3   ondansetron (ZOFRAN) 8 MG tablet, ondansetron HCl 8 mg tablet, Disp: , Rfl:    PARoxetine (PAXIL) 10 MG tablet,  Take 30 mg by mouth daily., Disp: , Rfl:    Semaglutide, 1 MG/DOSE, (OZEMPIC, 1 MG/DOSE,) 4 MG/3ML SOPN, Ozempic 1 mg/dose (4 mg/3 mL) subcutaneous pen injector (Patient not taking: Reported on 01/17/2022), Disp: , Rfl:    vitamin E 180 MG (400 UNITS) capsule, Take 400 Units by mouth daily. (Patient not taking: Reported on 01/17/2022), Disp: , Rfl:    Medications ordered in this encounter:  Meds ordered this encounter  Medications   amoxicillin (AMOXIL) 500 MG capsule    Sig: Take 1 capsule (500 mg total) by mouth 2 (two) times daily for 10 days.    Dispense:  20 capsule    Refill:  0    Order Specific Question:   Supervising Provider    Answer:   Chase Picket [id number]     *If you need refills on other medications prior to your next appointment, please contact your pharmacy*  Follow-Up: Call back or seek an in-person evaluation if the symptoms worsen or if the condition fails to improve as anticipated.  [facility] [phone]  Other Instructions Strep Throat, Adult Strep throat is an infection in the throat that is caused by bacteria. It is common during the cold months of the year. It mostly affects children who are 74-73 years old. However, people of all ages can get it at any time of the year. This infection spreads from person to person (is  contagious) through coughing, sneezing, or having close contact. Your health care provider may use other names to describe the infection. When strep throat affects the tonsils, it is called tonsillitis. When it affects the back of the throat, it is called pharyngitis. What are the causes? This condition is caused by the Streptococcus pyogenes bacteria. What increases the risk? You are more likely to develop this condition if: You care for school-age children, or are around school-age children. Children are more likely to get strep throat and may spread it to others. You spend time in crowded places where the infection can  spread easily. You have close contact with someone who has strep throat. What are the signs or symptoms? Symptoms of this condition include: Fever or chills. Redness, swelling, or pain in the tonsils or throat. Pain or difficulty when swallowing. White or yellow spots on the tonsils or throat. Tender glands in the neck and under the jaw. Bad smelling breath. Red rash all over the body. This is rare. How is this diagnosed? This condition is diagnosed by tests that check for the presence and the amount of bacteria that cause strep throat. They are: Rapid strep test. Your throat is swabbed and checked for the presence of bacteria. Results are usually ready in minutes. Throat culture test. Your throat is swabbed. The sample is placed in a cup that allows infections to grow. Results are usually ready in 1 or 2 days. How is this treated? This condition may be treated with: Medicines that kill germs (antibiotics). Medicines that relieve pain or fever. These include: Ibuprofen or acetaminophen. Aspirin, only for people who are over the age of 31. Throat lozenges. Throat sprays. Follow these instructions at home: Medicines  Take over-the-counter and prescription medicines only as told by your health care provider. Take your antibiotic medicine as told by your health care provider. Do not stop taking the antibiotic even if you start to feel better. Eating and drinking  If you have trouble swallowing, try eating soft foods until your sore throat feels better. Drink enough fluid to keep your urine pale yellow. To help relieve pain, you may have: Warm fluids, such as soup and tea. Cold fluids, such as frozen desserts or popsicles. General instructions Gargle with a salt-water mixture 3-4 times a day or as needed. To make a salt-water mixture, completely dissolve -1 tsp (3-6 g) of salt in 1 cup (237 mL) of warm water. Get plenty of rest. Stay home from work or school until you have been  taking antibiotics for 24 hours. Do not use any products that contain nicotine or tobacco. These products include cigarettes, chewing tobacco, and vaping devices, such as e-cigarettes. If you need help quitting, ask your health care provider. It is up to you to get your test results. Ask your health care provider, or the department that is doing the test, when your results will be ready. Keep all follow-up visits. This is important. How is this prevented?  Do not share food, drinking cups, or personal items that could cause the infection to spread to other people. Wash your hands often with soap and water for at least 20 seconds. If soap and water are not available, use hand sanitizer. Make sure that all people in your house wash their hands well. Have family members tested if they have a sore throat or fever. They may need an antibiotic if they have strep throat. Contact a health care provider if: You have swelling in your neck that keeps  getting bigger. You develop a rash, cough, or earache. You cough up a thick mucus that is green, yellow-brown, or bloody. You have pain or discomfort that does not get better with medicine. Your symptoms seem to be getting worse. You have a fever. Get help right away if: You have new symptoms, such as vomiting, severe headache, stiff or painful neck, chest pain, or shortness of breath. You have severe throat pain, drooling, or changes in your voice. You have swelling of the neck, or the skin on the neck becomes red and tender. You have signs of dehydration, such as tiredness (fatigue), dry mouth, and decreased urination. You become increasingly sleepy, or you cannot wake up completely. Your joints become red or painful. These symptoms may represent a serious problem that is an emergency. Do not wait to see if the symptoms will go away. Get medical help right away. Call your local emergency services (911 in the [place] Do not drive yourself to the  hospital. Summary Strep throat is an infection in the throat that is caused by the Streptococcus pyogenes bacteria. This infection is spread from person to person (is contagious) through coughing, sneezing, or having close contact. Take your medicines, including antibiotics, as told by your health care provider. Do not stop taking the antibiotic even if you start to feel better. To prevent the spread of germs, wash your hands well with soap and water. Have others do the same. Do not share food, drinking cups, or personal items. Get help right away if you have new symptoms, such as vomiting, severe headache, stiff or painful neck, chest pain, or shortness of breath. This information is not intended to replace advice given to you by your health care provider. Make sure you discuss any questions you have with your health care provider. Document Revised: 11/13/2020 Document Reviewed: 11/13/2020 Elsevier Patient Education  [place].    If you have been instructed to have an in-person evaluation today at a local Urgent Care facility, please use the link below. It will take you to a list of all of our available [facility], including address, phone number and hours of operation. Please do not delay care.  [facility]  If you or a family member do not have a primary care provider, use the link below to schedule a visit and establish care. When you choose a [facility] primary care physician or advanced practice provider, you gain a long-term partner in health. Find a Primary Care Provider  Learn more about [facility]'s in-office and virtual care options: [facility]

## 2022-07-17 ENCOUNTER — Ambulatory Visit: Payer: 59 | Attending: Internal Medicine | Admitting: Internal Medicine

## 2022-07-22 ENCOUNTER — Encounter: Payer: Self-pay | Admitting: Internal Medicine

## 2023-01-24 ENCOUNTER — Ambulatory Visit
Admission: EM | Admit: 2023-01-24 | Discharge: 2023-01-24 | Disposition: A | Discharge status: Home / Self Care | Payer: 59 | Attending: Emergency Medicine | Admitting: Emergency Medicine

## 2023-01-24 ENCOUNTER — Other Ambulatory Visit: Payer: Self-pay

## 2023-01-24 ENCOUNTER — Encounter: Payer: Self-pay | Admitting: Emergency Medicine

## 2023-01-24 DIAGNOSIS — M25561 Pain in right knee: Secondary | ICD-10-CM

## 2023-01-24 DIAGNOSIS — J020 Streptococcal pharyngitis: Secondary | ICD-10-CM

## 2023-01-24 DIAGNOSIS — M5136 Other intervertebral disc degeneration, lumbar region: Secondary | ICD-10-CM | POA: Diagnosis not present

## 2023-01-24 LAB — POCT RAPID STREP A (OFFICE): Rapid Strep A Screen: POSITIVE — AB

## 2023-01-24 MED ORDER — KETOROLAC TROMETHAMINE 30 MG/ML IJ SOLN
30.0000 mg | Freq: Once | INTRAMUSCULAR | Status: AC
  Administered 2023-01-24: 30 mg via INTRAMUSCULAR

## 2023-01-24 MED ORDER — PENICILLIN G BENZATHINE 1200000 UNIT/2ML IM SUSY
1.2000 10*6.[IU] | PREFILLED_SYRINGE | Freq: Once | INTRAMUSCULAR | Status: AC
  Administered 2023-01-24: 1.2 10*6.[IU] via INTRAMUSCULAR

## 2023-01-24 MED ORDER — METHYLPREDNISOLONE 4 MG PO TBPK: ORAL_TABLET | ORAL | 0 refills | Status: DC

## 2023-01-24 MED ORDER — ACETAMINOPHEN 325 MG PO TABS
975.0000 mg | ORAL_TABLET | Freq: Once | ORAL | Status: AC
  Administered 2023-01-24: 975 mg via ORAL

## 2023-01-24 MED ORDER — ACETAMINOPHEN 500 MG PO TABS: 1000.0000 mg | ORAL_TABLET | Freq: Three times a day (TID) | ORAL | 0 refills | Status: AC

## 2023-01-24 NOTE — ED Provider Notes (Signed)
[facility]    CSN: [id number] Arrival date & time: 01/24/23  1108    HISTORY   Chief Complaint  Patient presents with   Sore Throat   HPI Gloria Hall is a pleasant, 52 y.o. female who presents to urgent care today. Patient complains of sore throat for the past 3 days.  Patient denies fever, body aches, chills, rhinorrhea, abdominal pain, nausea, vomiting, diarrhea.  Patient denies known sick contacts.  Patient also complains of chronic lower back pain secondary to known bulging disc which is acutely worse at this time.  Patient states she is also dealing with right knee pain as well, feels that she needs to use a cane when walking for stability.  Patient denies prior injury to her right knee.  The history is provided by the patient.   Past Medical History:  Diagnosis Date   Anemia    Anxiety    Arthritis    Back pain, chronic    Bronchitis    Hypertension    IBS (irritable bowel syndrome)    LSIL (low grade squamous intraepithelial lesion) on Pap smear 08/21/2011   Migraines    Palpitations    Peripheral neuropathic pain    in hands   Patient Active Problem List   Diagnosis Date Noted   Palpitations    Hypertension    Anemia    Perimenopausal vasomotor symptoms 05/01/2016   DUB (dysfunctional uterine bleeding) 07/19/2014   Bilateral carpal tunnel syndrome 04/12/2014   Facial cellulitis 05/03/2012   Anxiety 05/03/2012   LSIL (low grade squamous intraepithelial lesion) on Pap smear 08/21/2011   Past Surgical History:  Procedure Laterality Date   DILATION AND CURETTAGE OF UTERUS     dilation and curretage     after a miscarriage   ENDOMETRIAL ABLATION     KNEE ARTHROSCOPY Left 12-01-13   LAPAROSCOPY  03/19/2011   Procedure: LAPAROSCOPY OPERATIVE;  Surgeon: Hollie Salk C. Marice Potter, MD;  Location: [facility];  Service: Gynecology;  Laterality: N/A;   NOVASURE ABLATION  03/19/2011   Procedure: NOVASURE ABLATION;  Surgeon: Hollie Salk C. Marice Potter, MD;  Location: [facility];  Service:  Gynecology;  Laterality: N/A;   TUBAL LIGATION  1993   OB History     Gravida  4   Para  3   Term  3   Preterm      AB  1   Living  3      SAB  1   IAB      Ectopic      Multiple      Live Births             Home Medications    Prior to Admission medications   Medication Sig Start Date End Date Taking? Authorizing Provider  acetaminophen (TYLENOL) 500 MG tablet Take 2 tablets (1,000 mg total) by mouth every 8 (eight) hours. 01/24/23 02/23/23 Yes Theadora Rama Scales, PA-C  methylPREDNISolone (MEDROL DOSEPAK) 4 MG TBPK tablet Take 24 mg on day 1, 20 mg on day 2, 16 mg on day 3, 12 mg on day 4, 8 mg on day 5, 4 mg on day 6.  Take all tablets in each row at once, do not spread tablets out throughout the day. 01/24/23  Yes Theadora Rama Scales, PA-C  ALPRAZolam Prudy Feeler) 0.5 MG tablet Take 0.5 mg by mouth daily as needed. 03/28/21   [provider]  celecoxib (CELEBREX) 200 MG capsule Take 200 mg by mouth daily. 12/21/21   [provider]  Cholecalciferol (VITAMIN D) 2000 units CAPS Take 1 capsule by mouth daily.    [provider]  ezetimibe (ZETIA) 10 MG tablet Take 1 tablet by mouth once daily 02/07/22   Parke Poisson, MD  levothyroxine (SYNTHROID) 25 MCG tablet levothyroxine 25 mcg tablet  Take 1 tablet by mouth once daily    [provider]  Magnesium 500 MG CAPS Take 1 capsule by mouth daily.    [provider]  omeprazole (PRILOSEC) 20 MG capsule Take 1 capsule (20 mg total) by mouth daily. 04/14/14   Ambrose Finland, NP  ondansetron (ZOFRAN) 8 MG tablet ondansetron HCl 8 mg tablet    [provider]  PARoxetine (PAXIL) 10 MG tablet Take 30 mg by mouth daily. 03/20/21   [provider]    Family History Family History  Problem Relation Age of Onset   Diabetes Mother        53   Hypertension Mother    Hyperlipidemia Mother    Fibromyalgia Mother    Sleep apnea Mother    Migraines Mother     Heart attack Father        42   Diabetes Maternal Aunt    Cancer Maternal Aunt        breast, metastatic cancer throughout body   Breast cancer Maternal Aunt    Crohn's disease Maternal Aunt    Cancer Maternal Uncle        leukemia   Diabetes Maternal Grandmother    Cancer Maternal Uncle        colon   Heart attack Maternal Grandfather 55   Heart attack Paternal Grandfather    Social History Social History   Tobacco Use   Smoking status: Former    Packs/day: 1.50    Years: 10.00    Additional pack years: 0.00    Total pack years: 15.00    Types: Cigarettes    Quit date: 05/03/2009    Years since quitting: 13.7   Smokeless tobacco: Never  Vaping Use   Vaping Use: Never used  Substance Use Topics   Alcohol use: No    Comment: quit in 2008   Drug use: No    Comment: 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    Allergies   Imitrex [sumatriptan]  Review of Systems Review of Systems Pertinent findings revealed after performing a 14 point review of systems has been noted in the history of present illness.  Physical Exam Vital Signs BP 136/85 (BP Location: Left Arm)   Pulse 98   Temp 99.2 F (37.3 C) (Oral)   Resp 18   LMP 07/04/2014   SpO2 95%   No data found.  Physical Exam Constitutional:      General: She is not in acute distress.    Appearance: She is well-developed. She is ill-appearing. She is not toxic-appearing.  HENT:     Head: Normocephalic and atraumatic.     Salivary Glands: Right salivary gland is diffusely enlarged and tender. Left salivary gland is diffusely enlarged and tender.     Right Ear: Hearing and external ear normal.     Left Ear: Hearing and external ear normal.     Ears:     Comments: Bilateral EACs with mild erythema, bilateral TMs are normal    Nose: No mucosal edema, congestion or rhinorrhea.     Right Turbinates: Not enlarged, swollen or pale.     Left Turbinates:  Not enlarged or swollen.     Right Sinus: No  maxillary sinus tenderness or frontal sinus tenderness.     Left Sinus: No maxillary sinus tenderness or frontal sinus tenderness.     Mouth/Throat:     Lips: Pink. No lesions.     Mouth: Mucous membranes are moist. No oral lesions or angioedema.     Dentition: No gingival swelling.     Tongue: No lesions.     Palate: No mass.     Pharynx: Uvula midline. Pharyngeal swelling, oropharyngeal exudate and posterior oropharyngeal erythema present. No uvula swelling.     Tonsils: Tonsillar exudate present. 2+ on the right. 2+ on the left.  Eyes:     Extraocular Movements: Extraocular movements intact.     Conjunctiva/sclera: Conjunctivae normal.     Pupils: Pupils are equal, round, and reactive to light.  Neck:     Thyroid: No thyroid mass, thyromegaly or thyroid tenderness.     Trachea: Tracheal tenderness present. No abnormal tracheal secretions or tracheal deviation.     Comments: Voice is muffled Cardiovascular:     Rate and Rhythm: Normal rate and regular rhythm.     Pulses: Normal pulses.     Heart sounds: Normal heart sounds, S1 normal and S2 normal. No murmur heard.    No friction rub. No gallop.  Pulmonary:     Effort: Pulmonary effort is normal. No accessory muscle usage, prolonged expiration, respiratory distress or retractions.     Breath sounds: No stridor, decreased air movement or transmitted upper airway sounds. No decreased breath sounds, wheezing, rhonchi or rales.  Abdominal:     General: Bowel sounds are normal.     Palpations: Abdomen is soft.     Tenderness: There is generalized abdominal tenderness. There is no right CVA tenderness, left CVA tenderness or rebound. Negative signs include Murphy's sign.     Hernia: No hernia is present.  Musculoskeletal:     Cervical back: Full passive range of motion without pain, normal range of motion and neck supple.     Lumbar back: Tenderness and bony tenderness present. No spasms. Decreased range of motion. Positive right  straight leg raise test. Negative left straight leg raise test.     Right knee: Swelling present. No effusion or erythema. Normal range of motion. Tenderness present over the medial joint line, lateral joint line and patellar tendon.     Right lower leg: No edema.     Left lower leg: No edema.  Lymphadenopathy:     Cervical: Cervical adenopathy present.     Right cervical: Superficial cervical adenopathy present.     Left cervical: Superficial cervical adenopathy present.  Skin:    General: Skin is warm and dry.     Findings: No erythema, lesion or rash.  Neurological:     General: No focal deficit present.     Mental Status: She is alert and oriented to person, place, and time. Mental status is at baseline.  Psychiatric:        Mood and Affect: Mood normal.        Behavior: Behavior normal.        Thought Content: Thought content normal.        Judgment: Judgment normal.     [facility] Couse / Diagnostics / Procedures:     Radiology No results found.  Procedures Procedures (including critical care time) EKG  Pending results:  Labs Reviewed  POCT RAPID STREP A (OFFICE) - Abnormal; Notable for the  following components:      Result Value   Rapid Strep A Screen Positive (*)    All other components within normal limits    Medications Ordered in UC: Medications  penicillin g benzathine (BICILLIN LA) 1200000 UNIT/2ML injection 1.2 Million Units (has no administration in time range)  ketorolac (TORADOL) 30 MG/ML injection 30 mg (has no administration in time range)  acetaminophen (TYLENOL) tablet 975 mg (has no administration in time range)    UC Diagnoses / Final Clinical Impressions(s)   I have reviewed the triage vital signs and the nursing notes.  Pertinent labs & imaging results that were available during my care of the patient were reviewed by me and considered in my medical decision making (see chart for details).    Final diagnoses:  Acute streptococcal pharyngitis   Bulging lumbar disc  Right knee pain, unspecified chronicity  Rapid strep test positive.  Patient opted to receive an injection of Bicillin 1,200,000 units during her visit today for treatment.  Patient advised to discard oral devices after 24 hours and replace them with new ones to avoid reinfection.  Precautions were given to bring family members back for testing if anyone begins to complain of symptoms of strep throat.  Patient was provided with an injection of ketorolac during their visit today for acute pain relief. Patient was advised to: Begin Medrol dose pack Begin acetaminophen 1000 mg 3 times daily on a scheduled basis. Apply ice pack to affected area 4 times daily for 20 minutes each time Consider physical therapy, chiropractic care, orthopedic follow-up Return precautions advised  Please see discharge instructions below for details of plan of care as provided to patient. ED Prescriptions     Medication Sig Dispense Auth. Provider   acetaminophen (TYLENOL) 500 MG tablet Take 2 tablets (1,000 mg total) by mouth every 8 (eight) hours. 180 tablet Theadora Rama Scales, PA-C   methylPREDNISolone (MEDROL DOSEPAK) 4 MG TBPK tablet Take 24 mg on day 1, 20 mg on day 2, 16 mg on day 3, 12 mg on day 4, 8 mg on day 5, 4 mg on day 6.  Take all tablets in each row at once, do not spread tablets out throughout the day. 21 tablet Theadora Rama Scales, PA-C      PDMP not reviewed this encounter.  Discharge Instructions:   Discharge Instructions      Your strep test today is positive.  We have provided you with an injection of penicillin for treatment.  No further treatment is needed.   After 24 hours, you will no longer be contagious.  Please discard your toothbrush after 24 hours, as well as any other oral devices that you are currently using, and replace them with new ones to avoid reinfection.  The mainstay of therapy for musculoskeletal pain is reduction of inflammation and  relaxation of tension which is causing inflammation.  Keep in mind, pain always begets more pain.  To help you stay ahead of your pain and inflammation, I have provided the following regimen for you:   During your visit today, you received an injection of ketorolac, high-dose nonsteroidal anti-inflammatory pain medication that should significantly reduce your pain for the next 6 to 8 hours.   You were also provided with Tylenol (acetaminophen) 975 mg.  Please begin taking Tylenol 975 mg every 8 hours to keep your pain well controlled. Please know that It is safe to take a maximum 3000 mg of Tylenol in a 24-hour period.  Please do  not exceed this amount.  Tylenol works best when taken on a scheduled basis.   Tomorrow morning, please begin taking methylprednisolone.  Please take 1 full row tablets at once with your breakfast meal.  This will continue to keep your inflammation under control until your body can heal.  Please continue Celebrex daily.   During the day, please set aside time to apply ice to the affected area 4 times daily for 20 minutes each application.  This can be achieved by using a bag of frozen peas or corn, a Ziploc bag filled with ice and water, or Ziploc bag filled with half rubbing alcohol and half Dawn dish detergent, frozen into a slush.  Please be careful not to apply ice directly to your skin, always place a soft cloth between you and the ice pack.  Over-the-counter products such as IcyHot and Biofreeze do not work nearly as well.   Please consider discussing referral to physical therapy with your primary care provider.  Physical therapist are very good at teasing out the underlying cause of acute musculoskeletal pain and helping with prevention of future recurrences.   If you would like to try to return return to urgent care in the next 2 to 3 days for repeat ketorolac injection, you are welcome to do so.        Disposition Upon Discharge:  Condition: stable for discharge  home Home: take medications as prescribed; routine discharge instructions as discussed; follow up as advised.  Patient presented with an acute illness with associated systemic symptoms and significant discomfort requiring urgent management. In my opinion, this is a condition that a prudent lay person (someone who possesses an average knowledge of health and medicine) may potentially expect to result in complications if not addressed urgently such as respiratory distress, impairment of bodily function or dysfunction of bodily organs.   Routine symptom specific, illness specific and/or disease specific instructions were discussed with the patient and/or caregiver at length.   As such, the patient has been evaluated and assessed, work-up was performed and treatment was provided in alignment with urgent care protocols and evidence based medicine.  Patient/parent/caregiver has been advised that the patient may require follow up for further testing and treatment if the symptoms continue in spite of treatment, as clinically indicated and appropriate.  Patient/parent/caregiver has been advised to report to orthopedic urgent care clinic or return to the [facility] or PCP in 3-5 days if no better; follow-up with orthopedics, PCP or the Emergency Department if new signs and symptoms develop or if the current signs or symptoms continue to change or worsen for further workup, evaluation and treatment as clinically indicated and appropriate  The patient will follow up with their current PCP if and as advised. If the patient does not currently have a PCP we will have assisted them in obtaining one.   The patient may need specialty follow up if the symptoms continue, in spite of conservative treatment and management, for further workup, evaluation, consultation and treatment as clinically indicated and appropriate.  Patient/parent/caregiver verbalized understanding and agreement of plan as discussed.  All questions were  addressed during visit.  Please see discharge instructions below for further details of plan.  This office note has been dictated using Teaching laboratory technician.  Unfortunately, this method of dictation can sometimes lead to typographical or grammatical errors.  I apologize for your inconvenience in advance if this occurs.  Please do not hesitate to reach out to me if clarification is needed.  Theadora Rama Scales, [place] 01/24/23 1218

## 2023-01-24 NOTE — ED Triage Notes (Signed)
Pt here for sore throat x 3 days and some chronic back and right knee pain

## 2023-01-24 NOTE — Discharge Instructions (Addendum)
Your strep test today is positive.  We have provided you with an injection of penicillin for treatment.  No further treatment is needed.   After 24 hours, you will no longer be contagious.  Please discard your toothbrush after 24 hours, as well as any other oral devices that you are currently using, and replace them with new ones to avoid reinfection.  The mainstay of therapy for musculoskeletal pain is reduction of inflammation and relaxation of tension which is causing inflammation.  Keep in mind, pain always begets more pain.  To help you stay ahead of your pain and inflammation, I have provided the following regimen for you:   During your visit today, you received an injection of ketorolac, high-dose nonsteroidal anti-inflammatory pain medication that should significantly reduce your pain for the next 6 to 8 hours.   You were also provided with Tylenol (acetaminophen) 975 mg.  Please begin taking Tylenol 975 mg every 8 hours to keep your pain well controlled. Please know that It is safe to take a maximum 3000 mg of Tylenol in a 24-hour period.  Please do not exceed this amount.  Tylenol works best when taken on a scheduled basis.   Tomorrow morning, please begin taking methylprednisolone.  Please take 1 full row tablets at once with your breakfast meal.  This will continue to keep your inflammation under control until your body can heal.  Please continue Celebrex daily.   During the day, please set aside time to apply ice to the affected area 4 times daily for 20 minutes each application.  This can be achieved by using a bag of frozen peas or corn, a Ziploc bag filled with ice and water, or Ziploc bag filled with half rubbing alcohol and half Dawn dish detergent, frozen into a slush.  Please be careful not to apply ice directly to your skin, always place a soft cloth between you and the ice pack.  Over-the-counter products such as IcyHot and Biofreeze do not work nearly as well.   Please consider  discussing referral to physical therapy with your primary care provider.  Physical therapist are very good at teasing out the underlying cause of acute musculoskeletal pain and helping with prevention of future recurrences.   If you would like to try to return return to urgent care in the next 2 to 3 days for repeat ketorolac injection, you are welcome to do so.

## 2023-02-07 ENCOUNTER — Other Ambulatory Visit: Payer: Self-pay | Admitting: Internal Medicine

## 2023-02-16 ENCOUNTER — Other Ambulatory Visit: Payer: Self-pay

## 2023-02-16 MED ORDER — EZETIMIBE 10 MG PO TABS: 10.0000 mg | ORAL_TABLET | Freq: Every day | ORAL | 0 refills | Status: DC

## 2023-03-14 ENCOUNTER — Other Ambulatory Visit (HOSPITAL_BASED_OUTPATIENT_CLINIC_OR_DEPARTMENT_OTHER): Payer: Self-pay

## 2023-03-14 DIAGNOSIS — G4730 Sleep apnea, unspecified: Secondary | ICD-10-CM

## 2023-03-16 ENCOUNTER — Other Ambulatory Visit: Payer: Self-pay | Admitting: Internal Medicine

## 2023-03-23 ENCOUNTER — Other Ambulatory Visit: Payer: Self-pay

## 2023-03-23 ENCOUNTER — Telehealth: Payer: Self-pay | Admitting: *Deleted

## 2023-03-23 MED ORDER — EZETIMIBE 10 MG PO TABS: 10.0000 mg | ORAL_TABLET | Freq: Every day | ORAL | 0 refills | Status: DC

## 2023-03-23 NOTE — Telephone Encounter (Signed)
   Pre-operative Risk Assessment    Patient Name: Gloria Hall  DOB: [birth year] MRN: [id number]      Request for Surgical Clearance    Procedure:   Left Total Knee Replacement  Date of Surgery:  Clearance TBD                                 Surgeon:  Dr. Teryl Lucy Surgeon's Group or Practice Name:  [facility] Phone number: [phone] Fax number:  [phone]   Type of Clearance Requested:   - Medical    Type of Anesthesia:  Spinal   Additional requests/questions:    Signed, Emmit Pomfret   03/23/2023, 11:18 AM

## 2023-03-23 NOTE — Telephone Encounter (Signed)
Pt is scheduled for 08/30 with Joni Reining for preop clearance. I will send her notes.

## 2023-03-23 NOTE — Telephone Encounter (Signed)
I spoke to surgery coordinator to see if it was ok for pt to wait until her 09/23 appt. She stated that if pt waited until then her surgery would be put out until October. I let her know that I would call pt to see if she wanted to wait or if I could find a sooner appt.

## 2023-03-23 NOTE — Telephone Encounter (Signed)
    Primary Cardiologist:None  Chart reviewed as part of pre-operative protocol coverage. Because of Joaquina Kelting past medical history and time since last visit, he/she will require a follow-up visit in order to better assess preoperative cardiovascular risk.  Pre-op covering staff: - Please schedule Office appointment and call patient to inform them. - Please contact requesting surgeon's office via preferred method (i.e, phone, fax) to inform them of need for appointment prior to surgery.  If applicable, this message will also be routed to pharmacy pool and/or primary cardiologist for input on holding anticoagulant/antiplatelet agent as requested below so that this information is available at time of patient's appointment.   Ronney Asters, NP  03/23/2023, 11:30 AM

## 2023-04-01 NOTE — Progress Notes (Addendum)
Cardiology Clinic Note   Patient Name: Gloria Hall Date of Encounter: 04/03/2023  Primary Care Provider:  Parke Poisson, MD Primary Cardiologist:  Parke Poisson, MD  Patient Profile    52 year old female with history of hypertension, hyperlipidemia palpitations, chest discomfort, anxiety, and chronic migraines with other history to include anemia chronic back pain IBS and peripheral neuropathic pain.  Was last seen in the office by Dr.Acharya on 01/17/2022.  It was noted that she had a cardiac CTA on 04/26/2021 with a coronary calcium score of 0.  Past Medical History    Past Medical History:  Diagnosis Date   Anemia    Anxiety    Arthritis    Back pain, chronic    Bronchitis    Hypertension    IBS (irritable bowel syndrome)    LSIL (low grade squamous intraepithelial lesion) on Pap smear 08/21/2011   Migraines    Palpitations    Peripheral neuropathic pain    in hands   Past Surgical History:  Procedure Laterality Date   DILATION AND CURETTAGE OF UTERUS     dilation and curretage     after a miscarriage   ENDOMETRIAL ABLATION     KNEE ARTHROSCOPY Left 12-01-13   LAPAROSCOPY  03/19/2011   Procedure: LAPAROSCOPY OPERATIVE;  Surgeon: Hollie Salk C. Marice Potter, MD;  Location: [facility];  Service: Gynecology;  Laterality: N/A;   NOVASURE ABLATION  03/19/2011   Procedure: NOVASURE ABLATION;  Surgeon: Hollie Salk C. Marice Potter, MD;  Location: [facility];  Service: Gynecology;  Laterality: N/A;   TUBAL LIGATION  1993    Allergies  Allergies  Allergen Reactions   Imitrex [Sumatriptan] Other (See Comments)    Throat tightness x 15 minutes; no swelling or itching    History of Present Illness    Gloria Hall returns for ongoing assessment and management of hyperlipidemia, hypertension, palpitations.  No evidence of CAD per cardiac CTA as described above.  On last office visit she was started on Zetia for better control of cholesterol since LDL was still elevated with follow-up fasting lipids and LFTs  to be scheduled.  She is here for pre-operative clearance to have Left Total Knee by Dr. Dion Saucier on TBD.  She is here today extremely tired, not getting very much sleep.  She is raising 4 grandchildren.  She is not very active.  She is trying to lose weight in order to prepare herself for surgery.  She needs to get below 225 in order to qualify.  She is not eating or drinking very much.  She states that the palpitations have improved she denies any chest pain, she is short of breath with exertion, tires easily.  She can complete all of her ADLs, clean her house, do some yard work on occasion.  The summer heat has kept her from doing significant amount of yard work.  She does have a sleep study planned through pulmonology on September 3.  Home Medications    Current Outpatient Medications  Medication Sig Dispense Refill   busPIRone (BUSPAR) 10 MG tablet Take 10 mg by mouth 2 (two) times daily.     ezetimibe (ZETIA) 10 MG tablet Take 1 tablet (10 mg total) by mouth daily. Take 1 tablet by mouth once daily. Need appointment for more refills. 1st Attempt. 30 tablet 0   levothyroxine (SYNTHROID) 25 MCG tablet levothyroxine 25 mcg tablet  Take 1 tablet by mouth once daily     meloxicam (MOBIC) 15 MG tablet Take 15 mg by mouth  daily as needed.     methocarbamol (ROBAXIN) 500 MG tablet Take 1 tablet by mouth 2 (two) times daily as needed.     omeprazole (PRILOSEC) 20 MG capsule Take 1 capsule (20 mg total) by mouth daily. (Patient taking differently: Take 20 mg by mouth as needed.) 90 capsule 3   ondansetron (ZOFRAN) 8 MG tablet Take 8 mg by mouth as needed for nausea or vomiting.     PARoxetine (PAXIL) 10 MG tablet Take 30 mg by mouth daily.     Rimegepant Sulfate (NURTEC) 75 MG TBDP TAKE 1 TABLET ON OR UNDER THE TONGUE ONCE A DAY AS DIRECTED FOR 8 DAYS.     traMADol (ULTRAM) 50 MG tablet Take 50 mg by mouth 3 (three) times daily as needed.     ALPRAZolam (XANAX) 0.5 MG tablet Take 0.5 mg by mouth daily  as needed. (Patient not taking: Reported on 04/03/2023)     celecoxib (CELEBREX) 200 MG capsule Take 200 mg by mouth daily. (Patient not taking: Reported on 04/03/2023)     Cholecalciferol (VITAMIN D) 2000 units CAPS Take 1 capsule by mouth daily. (Patient not taking: Reported on 04/03/2023)     Magnesium 500 MG CAPS Take 1 capsule by mouth daily. (Patient not taking: Reported on 04/03/2023)     methylPREDNISolone (MEDROL DOSEPAK) 4 MG TBPK tablet Take 24 mg on day 1, 20 mg on day 2, 16 mg on day 3, 12 mg on day 4, 8 mg on day 5, 4 mg on day 6.  Take all tablets in each row at once, do not spread tablets out throughout the day. (Patient not taking: Reported on 04/03/2023) 21 tablet 0   No current facility-administered medications for this visit.     Family History    Family History  Problem Relation Age of Onset   Diabetes Mother        28   Hypertension Mother    Hyperlipidemia Mother    Fibromyalgia Mother    Sleep apnea Mother    Migraines Mother    Heart attack Father        10   Diabetes Maternal Aunt    Cancer Maternal Aunt        breast, metastatic cancer throughout body   Breast cancer Maternal Aunt    Crohn's disease Maternal Aunt    Cancer Maternal Uncle        leukemia   Diabetes Maternal Grandmother    Cancer Maternal Uncle        colon   Heart attack Maternal Grandfather 55   Heart attack Paternal Grandfather    She indicated that her mother is alive. She indicated that her father is alive. She indicated that the status of her maternal grandmother is unknown. She indicated that her maternal grandfather is deceased. She indicated that her paternal grandmother is deceased. She indicated that her paternal grandfather is deceased. She indicated that the status of her maternal aunt is unknown.  Social History    Social History   Socioeconomic History   Marital status: Married    Spouse name: Kassondra Funke   Number of children: 3   Years of education: 12th   Highest  education level: Not on file  Occupational History    Employer: [employer]    Comment: [employer]  Tobacco Use   Smoking status: Former    Current packs/day: 0.00    Average packs/day: 1.5 packs/day for 10.0 years (  15.0 ttl pk-yrs)    Types: Cigarettes    Start date: 05/04/1999    Quit date: 05/03/2009    Years since quitting: 13.9   Smokeless tobacco: Never  Vaping Use   Vaping status: Never Used  Substance and Sexual Activity   Alcohol use: No    Comment: quit in 2008   Drug use: No    Comment: 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    Sexual activity: Yes    Birth control/protection: Surgical  Other Topics Concern   Not on file  Social History Narrative   Patient lives at home with spouse.   Caffeine Use: 2 cups of coffee and 2 cups of tea daily   Social Determinants of Health   Financial Resource Strain: Medium Risk (11/03/2022)   Received from [facility], [facility]   Overall Financial Resource Strain (CARDIA)    Difficulty of Paying Living Expenses: Somewhat hard  Food Insecurity: No Food Insecurity (11/03/2022)   Received from [facility], [facility]   Hunger Vital Sign    Worried About Running Out of Food in the Last Year: Never true    Ran Out of Food in the Last Year: Never true  Transportation Needs: No Transportation Needs (11/03/2022)   Received from [facility], [facility]   PRAPARE - Transportation    Lack of Transportation (Medical): No    Lack of Transportation (Non-Medical): No  Physical Activity: Unknown (11/03/2022)   Received from [facility], [facility]   Exercise Vital Sign    Days of Exercise per Week: 0 days    Minutes of Exercise per Session: Not on file  Stress: Stress Concern Present (11/03/2022)   Received from [facility], [facility] - Occupational Stress Questionnaire    Feeling of Stress : Rather much  Social  Connections: Moderately Integrated (11/03/2022)   Received from [facility], [facility]   Social Network    How would you rate your social network (family, work, friends)?: Adequate participation with social networks  Intimate Partner Violence: Not At Risk (11/03/2022)   Received from [facility], [facility]   HITS    Over the last 12 months how often did your partner physically hurt you?: 1    Over the last 12 months how often did your partner insult you or talk down to you?: 1    Over the last 12 months how often did your partner threaten you with physical harm?: 1    Over the last 12 months how often did your partner scream or curse at you?: 1     Review of Systems    General:  No chills, fever, night sweats or weight changes.  Significant fatigue. Cardiovascular:  No chest pain, dyspnea on exertion, edema, orthopnea, palpitations, paroxysmal nocturnal dyspnea. Dermatological: No rash, lesions/masses Respiratory: No cough, dyspnea Urologic: No hematuria, dysuria Abdominal:   No nausea, vomiting, diarrhea, bright red blood per rectum, melena, or hematemesis Neurologic:  No visual changes, wkns, changes in mental status. All other systems reviewed and are otherwise negative except as noted above.       Physical Exam    VS:  BP 100/64 (BP Location: Left Arm, Patient Position: Sitting, Cuff Size: Large)   Pulse 80   Ht 5' 3.25" (1.607 m)   Wt 233 lb 6.4 oz (105.9 kg)   LMP 07/04/2014   SpO2 98%   BMI 41.02 kg/m  ,  BMI Body mass index is 41.02 kg/m.     GEN: Well nourished, well developed, in no acute distress.  Morbidly obese HEENT: normal. Neck: Supple, no JVD, carotid bruits, or masses. Cardiac: RRR, no murmurs, rubs, or gallops. No clubbing, cyanosis, edema.  Radials/DP/PT 2+ and equal bilaterally.  Respiratory:  Respirations regular and unlabored, clear to auscultation bilaterally. GI: Soft, nontender, nondistended, BS + x 4. MS: no deformity or atrophy. Skin:  warm and dry, no rash. Neuro:  Strength and sensation are intact. Psych: Normal affect.      Lab Results  Component Value Date   WBC 12.9 (H) 08/04/2017   HGB 11.5 (L) 08/04/2017   HCT 37.0 08/04/2017   MCV 81.0 08/04/2017   PLT 331 08/04/2017   Lab Results  Component Value Date   CREATININE 0.52 (L) 04/18/2021   BUN 9 04/18/2021   NA 138 04/18/2021   K 4.9 04/18/2021   CL 99 04/18/2021   CO2 25 04/18/2021   Lab Results  Component Value Date   ALT 10 10/05/2012   AST 15 10/05/2012   ALKPHOS 70 10/05/2012   BILITOT 0.5 10/05/2012   Lab Results  Component Value Date   CHOL 181 02/20/2014   HDL 56 02/20/2014   LDLCALC 99 02/20/2014   TRIG 129 02/20/2014   CHOLHDL 3.2 02/20/2014    Lab Results  Component Value Date   HGBA1C 5.6 02/17/2014     Review of Prior Studies    Coronary CTA IMPRESSION: 1. Coronary calcium score of 0. This was 0 percentile for age and sex matched control.   2. Normal coronary origin with right dominance.   3. No evidence of CAD.   CAD-RADS 0. No evidence of CAD (0%). Consider non-atherosclerotic causes of chest pain.    Echo TTE 07/15/2018: Study Conclusions    - Left ventricle: The cavity size was normal. Wall thickness was    normal. Systolic function was normal. The estimated ejection    fraction was in the range of 60% to 65%. Wall motion was normal;    there were no regional wall motion abnormalities. Left    ventricular diastolic function parameters were normal for the    patient&'s age.    Monitor 07/15/2018: Indication: palpitations   Supraventricular Ectopy: rare SVT: three episodes lasting 4-5 beats, max rate of 152 bpm. Ventricular Ectopy: rare   NSVT: none Ventricular Tachycardia: none   Pauses: none AV block: none   Atrial fibrillation: none   Diary events: none    IMPRESSION: rare supraventricular and ventricular ectopy. Brief, infrequent episodes of SVT (3 episodes, <5 bpm).  Assessment & Plan    1.  Pre-Operative Clearance:   .According to the Revised Cardiac Risk Index (RCRI), her Perioperative Risk of Major Cardiac Event is (%): 0.4  Due to hypotension, significant fatigue, and dyspnea, I will hold preoperative clearance until echocardiogram as 1 has not been done since 2019.  If echocardiogram reveals normal LV systolic function she will be cleared for surgery.  Will await results and note will be sent to Dr. Dion Saucier with [facility] once it is been completed.  Addendum: Echocardiogram completed on 04/27/2023 revealed normal LVEF of 60 to 65%, diastolic parameters were normal.  Normal mitral, aortic valves.  Therefore, based on ACC/AHA guidelines, patient would be at acceptable risk for the planned procedure without further cardiovascular testing. I will route this recommendation to the requesting party via Epic fax function.    2.  Hypotension: Patient feels listless  and tired.  She has not been eating and drinking very much in an effort to lose weight.  I did recheck her blood pressure and it was 98/60.  She is not on any antihypertensive medications.  She has taken some tramadol but this was done yesterday and should be out of her system at this point.  Rechecking echocardiogram.  3.  Hypothyroidism: Patient is on levothyroxine and is being followed by PCP.  4.  Morbid obesity: Consider referral to [facility].  To be referred by primary care.  Signed, Bettey Mare. Liborio Nixon, ANP, AACC   04/03/2023 10:18 AM      Office [phone] Fax [phone]  Notice: This dictation was prepared with Dragon dictation along with smaller phrase technology. Any transcriptional errors that result from this process are unintentional and may not be corrected upon review.

## 2023-04-03 ENCOUNTER — Encounter: Payer: Self-pay | Admitting: Adult Health

## 2023-04-03 ENCOUNTER — Ambulatory Visit: Payer: 59 | Attending: Adult Health | Admitting: Adult Health

## 2023-04-03 VITALS — BP 100/64 | HR 80 | Ht 63.25 in | Wt 233.4 lb

## 2023-04-03 DIAGNOSIS — Z01818 Encounter for other preprocedural examination: Secondary | ICD-10-CM

## 2023-04-03 DIAGNOSIS — I950 Idiopathic hypotension: Secondary | ICD-10-CM | POA: Diagnosis not present

## 2023-04-03 DIAGNOSIS — R002 Palpitations: Secondary | ICD-10-CM | POA: Diagnosis not present

## 2023-04-03 NOTE — Patient Instructions (Signed)
Medication Instructions:  No Changes *If you need a refill on your cardiac medications before your next appointment, please call your pharmacy*   Lab Work: No Labs If you have labs (blood work) drawn today and your tests are completely normal, you will receive your results only by: MyChart Message (if you have MyChart) OR A paper copy in the mail If you have any lab test that is abnormal or we need to change your treatment, we will call you to review the results.   Testing/Procedures: [address], [address]. Your physician has requested that you have an echocardiogram. Echocardiography is a painless test that uses sound waves to create images of your heart. It provides your doctor with information about the size and shape of your heart and how well your heart's chambers and valves are working. This procedure takes approximately one hour. There are no restrictions for this procedure. Please do NOT wear cologne, perfume, aftershave, or lotions (deodorant is allowed). Please arrive 15 minutes prior to your appointment time.    Follow-Up: At [facility], you and your health needs are our priority.  As part of our continuing mission to provide you with exceptional heart care, we have created designated Provider Care Teams.  These Care Teams include your primary Cardiologist (physician) and Advanced Practice Providers (APPs -  Physician Assistants and Nurse Practitioners) who all work together to provide you with the care you need, when you need it.  We recommend signing up for the patient portal called "MyChart".  Sign up information is provided on this After Visit Summary.  MyChart is used to connect with patients for Virtual Visits (Telemedicine).  Patients are able to view lab/test results, encounter notes, upcoming appointments, etc.  Non-urgent messages can be sent to your provider as well.   To learn more about what you can do with MyChart, go to  [email or web address].    Your next appointment:   6 month(s)  Provider:   Parke Poisson, MD

## 2023-04-07 ENCOUNTER — Ambulatory Visit (HOSPITAL_BASED_OUTPATIENT_CLINIC_OR_DEPARTMENT_OTHER): Payer: 59 | Attending: Physician Assistant | Admitting: Internal Medicine

## 2023-04-07 DIAGNOSIS — G4730 Sleep apnea, unspecified: Secondary | ICD-10-CM

## 2023-04-07 DIAGNOSIS — G4733 Obstructive sleep apnea (adult) (pediatric): Secondary | ICD-10-CM | POA: Insufficient documentation

## 2023-04-12 DIAGNOSIS — G4730 Sleep apnea, unspecified: Secondary | ICD-10-CM | POA: Diagnosis not present

## 2023-04-12 NOTE — Procedures (Signed)
   Patient Name: Gloria Hall, Gloria Hall Date: 04/07/2023 Gender: Female D.O.B: 05-Nov-1970 Age (years): 52 Referring Provider: Norva Riffle Height (inches): 63 Interpreting Physician: Jetty Duhamel MD, ABSM Weight (lbs): 230 RPSGT: [place] BMI: 41 MRN: [id number] Neck Size: <br>  CLINICAL INFORMATION Sleep Study Type: HST Indication for sleep study: Insomnia with OSA Epworth Sleepiness Score: 5  SLEEP STUDY TECHNIQUE A multi-channel overnight portable sleep study was performed. The channels recorded were: nasal airflow, thoracic respiratory movement, and oxygen saturation with a pulse oximetry. Snoring was also monitored.  MEDICATIONS Patient self administered medications include: none reported.  SLEEP ARCHITECTURE Patient was studied for 494.6 minutes. The sleep efficiency was 100.0 % and the patient was supine for 0%. The arousal index was 0.0 per hour.  RESPIRATORY PARAMETERS The overall AHI was 41.1 per hour, with a central apnea index of 0 per hour. The oxygen nadir was 85% during sleep.  CARDIAC DATA Mean heart rate during sleep was 64.9 bpm.  IMPRESSIONS - Severe obstructive sleep apnea occurred during this study (AHI = 41.1/h). - Moderate oxygen desaturation was noted during this study (Min O2 = 85%, Mean 96%). - Patient snored.  DIAGNOSIS - Obstructive Sleep Apnea (G47.33)  RECOMMENDATIONS - Suggest CPAP titration sleep study or autopap. Other options would be based on clinical judgment. - Be careful with alcohol, sedatives and other CNS depressants that may worsen sleep apnea and disrupt normal sleep architecture. - Sleep hygiene should be reviewed to assess factors that may improve sleep quality. - Weight management and regular exercise should be initiated or continued.  [Electronically signed] 04/12/2023 11:08 AM  Jetty Duhamel MD, ABSM Diplomate, American Board of Sleep Medicine NPI: [id number]                           Jetty Duhamel Diplomate, American Board of Sleep Medicine  ELECTRONICALLY SIGNED ON:  04/12/2023, 11:06 AM [facility] PH: [phone]   FX: [phone] ACCREDITED BY THE AMERICAN ACADEMY OF SLEEP MEDICINE

## 2023-04-17 HISTORY — PX: COLONOSCOPY: SHX174

## 2023-04-20 ENCOUNTER — Other Ambulatory Visit: Payer: Self-pay | Admitting: Internal Medicine

## 2023-04-27 ENCOUNTER — Ambulatory Visit: Payer: 59 | Admitting: Adult Health

## 2023-04-27 ENCOUNTER — Ambulatory Visit (HOSPITAL_COMMUNITY): Payer: 59 | Attending: Adult Health

## 2023-04-27 DIAGNOSIS — Z01810 Encounter for preprocedural cardiovascular examination: Secondary | ICD-10-CM

## 2023-04-27 DIAGNOSIS — R002 Palpitations: Secondary | ICD-10-CM | POA: Insufficient documentation

## 2023-04-27 LAB — ECHOCARDIOGRAM COMPLETE
Area-P 1/2: 3.31 cm2
S' Lateral: 3 cm

## 2023-04-30 ENCOUNTER — Other Ambulatory Visit: Payer: Self-pay

## 2023-04-30 MED ORDER — EZETIMIBE 10 MG PO TABS: 10.0000 mg | ORAL_TABLET | Freq: Every day | ORAL | 3 refills | Status: DC

## 2023-05-21 ENCOUNTER — Other Ambulatory Visit (HOSPITAL_BASED_OUTPATIENT_CLINIC_OR_DEPARTMENT_OTHER): Payer: Self-pay

## 2023-05-21 DIAGNOSIS — G4733 Obstructive sleep apnea (adult) (pediatric): Secondary | ICD-10-CM

## 2023-06-10 NOTE — Progress Notes (Signed)
Office Visit Note  Patient: Gloria Hall             Date of Birth: 09/07/1970           MRN: [id number]             PCP: Parke Poisson, MD Referring: Norm Salt, [place] Visit Date: 06/24/2023 Occupation: @GUAROCC @  Subjective:  No chief complaint on file.   History of Present Illness: Gloria Hall is a 52 y.o. female seen in consultation per request of her PCP.  According the patient her symptoms started 20 years ago with bilateral carpal tunnel syndrome.  She states she never had surgery.  She used to use braces in the past and then she quit using.  She also has discomfort in her wrist and hands with intermittent swelling.  She states for the last 15 years she has had pain in her bilateral knee joints.  She was evaluated by Dr. Jerl Santos several years ago and had left knee joint arthroscopic surgery for meniscal tear repair.  She had some relief after the surgery and then gradually the symptoms got worse.  She has been under care of Dr. Dion Saucier since April 2024.  She was told that she had bilateral severe osteoarthritis.  She is scheduled to have left total knee replacement in December 2024.  She will need to eventually require right total knee replacement.  She also has discomfort in her right hip joint which was not evaluated.  She complains of pain in her shoulders, elbows, wrist and hands.  She notices intermittent swelling in her hands, knee joints and her ankles.  She notices more fluid retention on her ankles.  Patient was evaluated by Dr. Marylene Land in December 2018 at the time she had x-rays of her ankles.  She was told that she had plantar fasciitis.  She was given meloxicam.  In the no recurrence of plantar fasciitis since then.  She was given orthotics which were helpful.  She has chronic neck and lower back pain.  She states she has been to the pain management and was referred to physical therapy.  She is still going to the physical therapy and has not noticed much improvement.   She gives history of fatigue, dry mouth.  There is no history of Raynaud's, malar rash, photosensitivity or lymphadenopathy.There is family history of lupus in first maternal cousin and Crohn's in maternal aunt.  She is gravida 4, para 3, miscarriage 1.  There is no history of preeclampsia or DVTs.  She is currently unemployed.  She is to work as a Lawyer at [employer].  She enjoys reading and walking but is not able to walk much due to knee joint pain.    Activities of Daily Living:  Patient reports morning stiffness for several hours.   Patient Reports nocturnal pain.  Difficulty dressing/grooming: Reports Difficulty climbing stairs: Reports Difficulty getting out of chair: Reports Difficulty using hands for taps, buttons, cutlery, and/or writing: Reports  Review of Systems  Constitutional:  Positive for fatigue.  HENT:  Positive for mouth dryness. Negative for mouth sores.   Eyes:  Negative for dryness.  Respiratory:  Negative for difficulty breathing.   Cardiovascular:  Negative for chest pain and palpitations.  Gastrointestinal:  Positive for diarrhea. Negative for blood in stool and constipation.       History of IBS  Endocrine: Negative for increased urination.  Genitourinary:  Positive for involuntary urination.  Musculoskeletal:  Positive for joint pain, gait  problem, joint pain, joint swelling, myalgias, muscle weakness, morning stiffness, muscle tenderness and myalgias.  Skin:  Negative for color change, rash, hair loss and sensitivity to sunlight.  Allergic/Immunologic: Negative for susceptible to infections.  Neurological:  Positive for dizziness, numbness and headaches.  Hematological:  Negative for swollen glands.  Psychiatric/Behavioral:  Positive for depressed mood and sleep disturbance. The patient is nervous/anxious.     PMFS History:  Patient Active Problem List   Diagnosis Date Noted   Palpitations    Hypertension    Anemia    Perimenopausal vasomotor  symptoms 05/01/2016   DUB (dysfunctional uterine bleeding) 07/19/2014   Bilateral carpal tunnel syndrome 04/12/2014   Facial cellulitis 05/03/2012   Anxiety 05/03/2012   LSIL (low grade squamous intraepithelial lesion) on Pap smear 08/21/2011    Past Medical History:  Diagnosis Date   Anemia    Anxiety    Arthritis    Back pain, chronic    Bronchitis    Hypertension    IBS (irritable bowel syndrome)    LSIL (low grade squamous intraepithelial lesion) on Pap smear 08/21/2011   Migraines    Palpitations    Peripheral neuropathic pain    in hands    Family History  Problem Relation Age of Onset   Arthritis Mother    Diabetes Mother        67   Hypertension Mother    Hyperlipidemia Mother    Fibromyalgia Mother    Sleep apnea Mother    Migraines Mother    Heart attack Father        x7   Heart Problems Father    Diabetes Father    Heart Problems Sister    Hypertension Sister    Arthritis Sister    Diabetes Maternal Aunt    Cancer Maternal Aunt        breast, metastatic cancer throughout body   Breast cancer Maternal Aunt    Crohn's disease Maternal Aunt    Cancer Maternal Uncle        leukemia   Cancer Maternal Uncle        colon   Diabetes Maternal Grandmother    Heart attack Maternal Grandfather 18   Heart attack Paternal Grandfather    Healthy Son    Healthy Daughter    Healthy Daughter    Past Surgical History:  Procedure Laterality Date   COLONOSCOPY  04/17/2023   Dr. Dulce Sellar at [facility] AND CURETTAGE OF UTERUS     dilation and curretage     after a miscarriage   ENDOMETRIAL ABLATION     KNEE ARTHROSCOPY Left 12/01/2013   LAPAROSCOPY  03/19/2011   Procedure: LAPAROSCOPY OPERATIVE;  Surgeon: Hollie Salk C. Marice Potter, MD;  Location: [facility];  Service: Gynecology;  Laterality: N/A;   NOVASURE ABLATION  03/19/2011   Procedure: NOVASURE ABLATION;  Surgeon: Hollie Salk C. Marice Potter, MD;  Location: [facility];  Service: Gynecology;  Laterality: N/A;   TUBAL LIGATION  1993    Social History   Social History Narrative   Patient lives at home with spouse.   Caffeine Use: 2 cups of coffee and 2 cups of tea daily    There is no immunization history on file for this patient.   Objective: Vital Signs: BP 121/85 (BP Location: Right Arm, Patient Position: Sitting, Cuff Size: Normal)   Pulse 76   Resp 16   Ht 5' 3.25" (1.607 m)   Wt 229 lb 6.4 oz (104.1 kg)   LMP  07/04/2014   BMI 40.32 kg/m    Physical Exam Vitals and nursing note reviewed.  Constitutional:      Appearance: She is well-developed.  HENT:     Head: Normocephalic and atraumatic.  Eyes:     Conjunctiva/sclera: Conjunctivae normal.  Cardiovascular:     Rate and Rhythm: Normal rate and regular rhythm.     Heart sounds: Normal heart sounds.  Pulmonary:     Effort: Pulmonary effort is normal.     Breath sounds: Normal breath sounds.  Abdominal:     General: Bowel sounds are normal.     Palpations: Abdomen is soft.  Musculoskeletal:     Cervical back: Normal range of motion.  Lymphadenopathy:     Cervical: No cervical adenopathy.  Skin:    General: Skin is warm and dry.     Capillary Refill: Capillary refill takes less than 2 seconds.  Neurological:     Mental Status: She is alert and oriented to person, place, and time.  Psychiatric:        Behavior: Behavior normal.      Musculoskeletal Exam: Patient had good range of motion of the cervical spine with discomfort.  However she had painful limited range of motion of the lumbar spine with some tenderness in the lower lumbar region.  Shoulder joints were in good range of motion with discomfort range of motion of bilateral shoulder joints.  Elbow joints were in good range of motion.  She had tenderness over bilateral lateral epicondyle region.  Wrist joints, MCPs PIPs and DIPs were in good range of motion with no synovitis.  Hip joints were in good range of motion.  She had tenderness of bilateral trochanteric bursa.  She had warmth on  palpation of her right knee joint.  She had limited extension of her right knee joint.  Left knee joint was in full range of motion with discomfort.  There was no tenderness over ankles.  She had tenderness across MTPs but no synovitis was noted.  Generalized hyperalgesia and positive tender points are noted.  CDAI Exam: CDAI Score: -- Patient Global: --; Provider Global: -- Swollen: --; Tender: -- Joint Exam 06/24/2023   No joint exam has been documented for this visit   There is currently no information documented on the homunculus. Go to the Rheumatology activity and complete the homunculus joint exam.  Investigation: No additional findings.  Imaging: No results found.  Recent Labs: Lab Results  Component Value Date   WBC 12.9 (H) 08/04/2017   HGB 11.5 (L) 08/04/2017   PLT 331 08/04/2017   NA 138 04/18/2021   K 4.9 04/18/2021   CL 99 04/18/2021   CO2 25 04/18/2021   GLUCOSE 79 04/18/2021   BUN 9 04/18/2021   CREATININE 0.52 (L) 04/18/2021   BILITOT 0.5 10/05/2012   ALKPHOS 70 10/05/2012   AST 15 10/05/2012   ALT 10 10/05/2012   PROT 6.7 05/27/2013   ALBUMIN 3.4 (L) 10/05/2012   CALCIUM 9.5 04/18/2021   GFRAA >60 08/04/2017    Speciality Comments: No specialty comments available.  Procedures:  No procedures performed Allergies: Imitrex [sumatriptan]   Assessment / Plan:     Visit Diagnoses: Polyarthralgia-patient complains of pain and discomfort in multiple joints.  No synovitis was noted on the examination.  Positive ANA (antinuclear antibody) - 01/16/23: ANA 1:80NH, dsDNA<1 -I did detailed discussion with the patient regarding positive ANA.  She has low titer positive ANA.  She gives history of sicca symptoms.  There is no history of malar rash, photosensitivity, Raynaud's, lymphadenopathy.  She gives history of arthralgias and fatigue.  Plan: Sedimentation rate, Protein / creatinine ratio, urine, ANA, Anti-scleroderma antibody, RNP Antibody, Anti-Smith antibody,  Sjogrens syndrome-A extractable nuclear antibody, Sjogrens syndrome-B extractable nuclear antibody, Anti-DNA antibody, double-stranded, C3 and C4, Beta-2 glycoprotein antibodies, Cardiolipin antibodies, IgG, IgM, IgA  Chronic pain of both shoulders -she complains of pain and discomfort in the bilateral shoulders.  She had discomfort range of motion of bilateral shoulders more so on the right side.  No effusion was noted.  Plan: XR Shoulder Right.  X-rays were unremarkable.  Pain in both hands -complains of discomfort in the bilateral hands.  No synovitis was noted on the examination.  Plan: XR Hand 2 View Right, XR Hand 2 View Left, x-rays of bilateral hands were unremarkable.  Rheumatoid factor, Cyclic citrul peptide antibody, IgG  Bilateral carpal tunnel syndrome-she gives history of bilateral carpal tunnel syndrome for many years.  She states she used to wear carpal tunnel braces.  Trochanteric bursitis of both hips-she had tenderness on palpation of bilateral trochanteric bursa.  She had good range of motion of her hip joints.  Primary osteoarthritis of both knees -she complains of discomfort in the bilateral knee joints and has difficulty walking.  Warmth is noted in her right knee joint with limited extension.  She is scheduled for left total knee replacement on December 31st 2024 by Dr. Dion Saucier.  She has severe osteoarthritis of right knee joint as well per patient.  She states she will get right total knee replacement later.  Pain in both feet -she complains of discomfort in the bilateral feet and her ankles.  She was evaluated by Dr. Roseanne Reno in the past for plantar fasciitis.  She states she has had no recurrence of plantar fasciitis since she has been using orthotics.  Plan: XR Foot 2 Views Right, XR Foot 2 Views Left.  X-rays of bilateral feet showed early degenerative changes.  Chronic midline low back pain without sciatica -she complains of chronic pain in her lumbar spine.  Patient states  she has tried physical therapy without much relief.  Plan: XR Lumbar Spine 2-3 Views.  Lumbar facet joint arthropathy was noted.  Neck pain-she complains of discomfort in her cervical spine.  She had good range of motion without discomfort on the examination today.  Other fatigue -she complains of chronic fatigue.  Plan: CBC with Differential/Platelet, COMPLETE METABOLIC PANEL WITH GFR, TSH, Glucose 6 phosphate dehydrogenase  Myalgia -she complains of pain and discomfort in all of her muscles.  She had generalized hyperalgesia and positive tender points.  There is family history of fibromyalgia in her mother.  Plan: CK  Other medical problems are listed as follows:  Irritable bowel syndrome with diarrhea  History of anemia  Palpitations  Anxiety  Perimenopausal vasomotor symptoms  Family history of fibromyalgia-mother  Family history of systemic lupus erythematosus-maternal first cousin  Family history of Crohn's disease-maternal aunt  Orders: Orders Placed This Encounter  Procedures   XR Hand 2 View Right   XR Hand 2 View Left   XR Shoulder Right   XR Lumbar Spine 2-3 Views   XR Foot 2 Views Right   XR Foot 2 Views Left   CBC with Differential/Platelet   COMPLETE METABOLIC PANEL WITH GFR   Sedimentation rate   CK   TSH   Protein / creatinine ratio, urine   Rheumatoid factor   Cyclic citrul peptide antibody, IgG  ANA   Anti-scleroderma antibody   RNP Antibody   Anti-Smith antibody   Sjogrens syndrome-A extractable nuclear antibody   Sjogrens syndrome-B extractable nuclear antibody   Anti-DNA antibody, double-stranded   C3 and C4   Beta-2 glycoprotein antibodies   Cardiolipin antibodies, IgG, IgM, IgA   Glucose 6 phosphate dehydrogenase   No orders of the defined types were placed in this encounter.    Follow-Up Instructions: Return for Arthralgia, positive ANA.   Pollyann Savoy, MD  Note - This record has been created using Animal nutritionist.  Chart  creation errors have been sought, but may not always  have been located. Such creation errors do not reflect on  the standard of medical care.

## 2023-06-24 ENCOUNTER — Ambulatory Visit: Payer: 59

## 2023-06-24 ENCOUNTER — Ambulatory Visit: Payer: 59 | Attending: Rheumatology | Admitting: Rheumatology

## 2023-06-24 ENCOUNTER — Encounter: Payer: Self-pay | Admitting: Rheumatology

## 2023-06-24 VITALS — BP 121/85 | HR 76 | Resp 16 | Ht 63.25 in | Wt 229.4 lb

## 2023-06-24 DIAGNOSIS — N951 Menopausal and female climacteric states: Secondary | ICD-10-CM

## 2023-06-24 DIAGNOSIS — M25511 Pain in right shoulder: Secondary | ICD-10-CM | POA: Diagnosis not present

## 2023-06-24 DIAGNOSIS — M79671 Pain in right foot: Secondary | ICD-10-CM

## 2023-06-24 DIAGNOSIS — G8929 Other chronic pain: Secondary | ICD-10-CM

## 2023-06-24 DIAGNOSIS — M2550 Pain in unspecified joint: Secondary | ICD-10-CM

## 2023-06-24 DIAGNOSIS — K580 Irritable bowel syndrome with diarrhea: Secondary | ICD-10-CM

## 2023-06-24 DIAGNOSIS — M79642 Pain in left hand: Secondary | ICD-10-CM | POA: Diagnosis not present

## 2023-06-24 DIAGNOSIS — G5603 Carpal tunnel syndrome, bilateral upper limbs: Secondary | ICD-10-CM

## 2023-06-24 DIAGNOSIS — R768 Other specified abnormal immunological findings in serum: Secondary | ICD-10-CM

## 2023-06-24 DIAGNOSIS — M170 Bilateral primary osteoarthritis of knee: Secondary | ICD-10-CM

## 2023-06-24 DIAGNOSIS — M7061 Trochanteric bursitis, right hip: Secondary | ICD-10-CM

## 2023-06-24 DIAGNOSIS — M79672 Pain in left foot: Secondary | ICD-10-CM

## 2023-06-24 DIAGNOSIS — Z8269 Family history of other diseases of the musculoskeletal system and connective tissue: Secondary | ICD-10-CM

## 2023-06-24 DIAGNOSIS — I10 Essential (primary) hypertension: Secondary | ICD-10-CM

## 2023-06-24 DIAGNOSIS — F419 Anxiety disorder, unspecified: Secondary | ICD-10-CM

## 2023-06-24 DIAGNOSIS — M79641 Pain in right hand: Secondary | ICD-10-CM | POA: Diagnosis not present

## 2023-06-24 DIAGNOSIS — M25512 Pain in left shoulder: Secondary | ICD-10-CM

## 2023-06-24 DIAGNOSIS — M545 Low back pain, unspecified: Secondary | ICD-10-CM

## 2023-06-24 DIAGNOSIS — M791 Myalgia, unspecified site: Secondary | ICD-10-CM

## 2023-06-24 DIAGNOSIS — M542 Cervicalgia: Secondary | ICD-10-CM

## 2023-06-24 DIAGNOSIS — Z862 Personal history of diseases of the blood and blood-forming organs and certain disorders involving the immune mechanism: Secondary | ICD-10-CM

## 2023-06-24 DIAGNOSIS — R002 Palpitations: Secondary | ICD-10-CM

## 2023-06-24 DIAGNOSIS — M7062 Trochanteric bursitis, left hip: Secondary | ICD-10-CM

## 2023-06-24 DIAGNOSIS — R5383 Other fatigue: Secondary | ICD-10-CM

## 2023-06-24 DIAGNOSIS — Z8379 Family history of other diseases of the digestive system: Secondary | ICD-10-CM

## 2023-06-24 NOTE — Patient Instructions (Signed)
Low Back Sprain or Strain Rehab Ask your health care provider which exercises are safe for you. Do exercises exactly as told by your health care provider and adjust them as directed. It is normal to feel mild stretching, pulling, tightness, or discomfort as you do these exercises. Stop right away if you feel sudden pain or your pain gets worse. Do not begin these exercises until told by your health care provider. Stretching and range-of-motion exercises These exercises warm up your muscles and joints and improve the movement and flexibility of your back. These exercises also help to relieve pain, numbness, and tingling. Lumbar rotation  Lie on your back on a firm bed or the floor with your knees bent. Straighten your arms out to your sides so each arm forms a 90-degree angle (right angle) with a side of your body. Slowly move (rotate) both of your knees to one side of your body until you feel a stretch in your lower back (lumbar). Try not to let your shoulders lift off the floor. Hold this position for __________ seconds. Tense your abdominal muscles and slowly move your knees back to the starting position. Repeat this exercise on the other side of your body. Repeat __________ times. Complete this exercise __________ times a day. Single knee to chest  Lie on your back on a firm bed or the floor with both legs straight. Bend one of your knees. Use your hands to move your knee up toward your chest until you feel a gentle stretch in your lower back and buttock. Hold your leg in this position by holding on to the front of your knee. Keep your other leg as straight as possible. Hold this position for __________ seconds. Slowly return to the starting position. Repeat with your other leg. Repeat __________ times. Complete this exercise __________ times a day. Prone extension on elbows  Lie on your abdomen on a firm bed or the floor (prone position). Prop yourself up on your elbows. Use your arms  to help lift your chest up until you feel a gentle stretch in your abdomen and your lower back. This will place some of your body weight on your elbows. If this is uncomfortable, try stacking pillows under your chest. Your hips should stay down, against the surface that you are lying on. Keep your hip and back muscles relaxed. Hold this position for __________ seconds. Slowly relax your upper body and return to the starting position. Repeat __________ times. Complete this exercise __________ times a day. Strengthening exercises These exercises build strength and endurance in your back. Endurance is the ability to use your muscles for a long time, even after they get tired. Pelvic tilt This exercise strengthens the muscles that lie deep in the abdomen. Lie on your back on a firm bed or the floor with your legs extended. Bend your knees so they are pointing toward the ceiling and your feet are flat on the floor. Tighten your lower abdominal muscles to press your lower back against the floor. This motion will tilt your pelvis so your tailbone points up toward the ceiling instead of pointing to your feet or the floor. To help with this exercise, you may place a small towel under your lower back and try to push your back into the towel. Hold this position for __________ seconds. Let your muscles relax completely before you repeat this exercise. Repeat __________ times. Complete this exercise __________ times a day. Alternating arm and leg raises  Get on your hands  and knees on a firm surface. If you are on a hard floor, you may want to use padding, such as an exercise mat, to cushion your knees. Line up your arms and legs. Your hands should be directly below your shoulders, and your knees should be directly below your hips. Lift your left leg behind you. At the same time, raise your right arm and straighten it in front of you. Do not lift your leg higher than your hip. Do not lift your arm higher  than your shoulder. Keep your abdominal and back muscles tight. Keep your hips facing the ground. Do not arch your back. Keep your balance carefully, and do not hold your breath. Hold this position for __________ seconds. Slowly return to the starting position. Repeat with your right leg and your left arm. Repeat __________ times. Complete this exercise __________ times a day. Abdominal set with straight leg raise  Lie on your back on a firm bed or the floor. Bend one of your knees and keep your other leg straight. Tense your abdominal muscles and lift your straight leg up, 4-6 inches (10-15 cm) off the ground. Keep your abdominal muscles tight and hold this position for __________ seconds. Do not hold your breath. Do not arch your back. Keep it flat against the ground. Keep your abdominal muscles tense as you slowly lower your leg back to the starting position. Repeat with your other leg. Repeat __________ times. Complete this exercise __________ times a day. Single leg lower with bent knees Lie on your back on a firm bed or the floor. Tense your abdominal muscles and lift your feet off the floor, one foot at a time, so your knees and hips are bent in 90-degree angles (right angles). Your knees should be over your hips and your lower legs should be parallel to the floor. Keeping your abdominal muscles tense and your knee bent, slowly lower one of your legs so your toe touches the ground. Lift your leg back up to return to the starting position. Do not hold your breath. Do not let your back arch. Keep your back flat against the ground. Repeat with your other leg. Repeat __________ times. Complete this exercise __________ times a day. Posture and body mechanics Good posture and healthy body mechanics can help to relieve stress in your body's tissues and joints. Body mechanics refers to the movements and positions of your body while you do your daily activities. Posture is part of body  mechanics. Good posture means: Your spine is in its natural S-curve position (neutral). Your shoulders are pulled back slightly. Your head is not tipped forward (neutral). Follow these guidelines to improve your posture and body mechanics in your everyday activities. Standing  When standing, keep your spine neutral and your feet about hip-width apart. Keep a slight bend in your knees. Your ears, shoulders, and hips should line up. When you do a task in which you stand in one place for a long time, place one foot up on a stable object that is 2-4 inches (5-10 cm) high, such as a footstool. This helps keep your spine neutral. Sitting  When sitting, keep your spine neutral and keep your feet flat on the floor. Use a footrest, if necessary, and keep your thighs parallel to the floor. Avoid rounding your shoulders, and avoid tilting your head forward. When working at a desk or a computer, keep your desk at a height where your hands are slightly lower than your elbows. Slide your  chair under your desk so you are close enough to maintain good posture. When working at a computer, place your monitor at a height where you are looking straight ahead and you do not have to tilt your head forward or downward to look at the screen. Resting When lying down and resting, avoid positions that are most painful for you. If you have pain with activities such as sitting, bending, stooping, or squatting, lie in a position in which your body does not bend very much. For example, avoid curling up on your side with your arms and knees near your chest (fetal position). If you have pain with activities such as standing for a long time or reaching with your arms, lie with your spine in a neutral position and bend your knees slightly. Try the following positions: Lying on your side with a pillow between your knees. Lying on your back with a pillow under your knees. Lifting  When lifting objects, keep your feet at least  shoulder-width apart and tighten your abdominal muscles. Bend your knees and hips and keep your spine neutral. It is important to lift using the strength of your legs, not your back. Do not lock your knees straight out. Always ask for help to lift heavy or awkward objects. This information is not intended to replace advice given to you by your health care provider. Make sure you discuss any questions you have with your health care provider. Document Revised: 11/24/2022 Document Reviewed: 10/08/2020 Elsevier Patient Education  2024 [employer].

## 2023-06-27 LAB — PROTEIN / CREATININE RATIO, URINE
Creatinine, Urine: 17 mg/dL — ABNORMAL LOW (ref 20–275)
Total Protein, Urine: <4 mg/dL — ABNORMAL LOW (ref 5–24)

## 2023-06-27 LAB — COMPLETE METABOLIC PANEL WITH GFR
AG Ratio: 1.2 (calc) (ref 1.0–2.5)
ALT: 18 U/L (ref 6–29)
AST: 15 U/L (ref 10–35)
Albumin: 3.9 g/dL (ref 3.6–5.1)
Alkaline phosphatase (APISO): 88 U/L (ref 37–153)
BUN: 9 mg/dL (ref 7–25)
CO2: 27 mmol/L (ref 20–32)
Calcium: 9.5 mg/dL (ref 8.6–10.4)
Chloride: 103 mmol/L (ref 98–110)
Creat: 0.68 mg/dL (ref 0.50–1.03)
Globulin: 3.3 g/dL (ref 1.9–3.7)
Glucose, Bld: 78 mg/dL (ref 65–99)
Potassium: 4.1 mmol/L (ref 3.5–5.3)
Sodium: 139 mmol/L (ref 135–146)
Total Bilirubin: 0.6 mg/dL (ref 0.2–1.2)
Total Protein: 7.2 g/dL (ref 6.1–8.1)
eGFR: 104 mL/min/{1.73_m2} (ref >=60)

## 2023-06-27 LAB — CBC WITH DIFFERENTIAL/PLATELET
Absolute Lymphocytes: 2050 {cells}/uL (ref 850–3900)
Absolute Monocytes: 772 {cells}/uL (ref 200–950)
Basophils Absolute: 50 {cells}/uL (ref 0–200)
Basophils Relative: 0.6 %
Eosinophils Absolute: 100 {cells}/uL (ref 15–500)
Eosinophils Relative: 1.2 %
HCT: 39.7 % (ref 35.0–45.0)
Hemoglobin: 12.4 g/dL (ref 11.7–15.5)
MCH: 25.3 pg — ABNORMAL LOW (ref 27.0–33.0)
MCHC: 31.2 g/dL — ABNORMAL LOW (ref 32.0–36.0)
MCV: 80.9 fL (ref 80.0–100.0)
MPV: 11.4 fL (ref 7.5–12.5)
Monocytes Relative: 9.3 %
Neutro Abs: 5329 {cells}/uL (ref 1500–7800)
Neutrophils Relative %: 64.2 %
Platelets: 354 10*3/uL (ref 140–400)
RBC: 4.91 10*6/uL (ref 3.80–5.10)
RDW: 13.6 % (ref 11.0–15.0)
Total Lymphocyte: 24.7 %
WBC: 8.3 10*3/uL (ref 3.8–10.8)

## 2023-06-27 LAB — ANTI-SMITH ANTIBODY: ENA SM Ab Ser-aCnc: <1 AI

## 2023-06-27 LAB — BETA-2 GLYCOPROTEIN ANTIBODIES
Beta-2 Glyco 1 IgA: <2 U/mL (ref <20.0)
Beta-2 Glyco 1 IgM: <2 U/mL (ref <20.0)
Beta-2 Glyco I IgG: <2 U/mL (ref <20.0)

## 2023-06-27 LAB — ANTI-NUCLEAR AB-TITER (ANA TITER)
ANA TITER: 1:80 {titer} — ABNORMAL HIGH
ANA Titer 1: 1:80 {titer} — ABNORMAL HIGH

## 2023-06-27 LAB — SJOGRENS SYNDROME-B EXTRACTABLE NUCLEAR ANTIBODY: SSB (La) (ENA) Antibody, IgG: <1 AI

## 2023-06-27 LAB — RNP ANTIBODY: Ribonucleic Protein(ENA) Antibody, IgG: <1 AI

## 2023-06-27 LAB — CARDIOLIPIN ANTIBODIES, IGG, IGM, IGA
Anticardiolipin IgA: <2 [APL'U]/mL (ref <20.0)
Anticardiolipin IgG: <2 [GPL'U]/mL (ref <20.0)
Anticardiolipin IgM: 2.1 [MPL'U]/mL (ref <20.0)

## 2023-06-27 LAB — TSH: TSH: 1.74 m[IU]/L

## 2023-06-27 LAB — RHEUMATOID FACTOR: Rheumatoid fact SerPl-aCnc: <10 [IU]/mL (ref <14)

## 2023-06-27 LAB — CYCLIC CITRUL PEPTIDE ANTIBODY, IGG: Cyclic Citrullin Peptide Ab: <16 U

## 2023-06-27 LAB — ANTI-DNA ANTIBODY, DOUBLE-STRANDED: ds DNA Ab: <1 [IU]/mL

## 2023-06-27 LAB — SJOGRENS SYNDROME-A EXTRACTABLE NUCLEAR ANTIBODY: SSA (Ro) (ENA) Antibody, IgG: <1 AI

## 2023-06-27 LAB — SEDIMENTATION RATE: Sed Rate: 33 mm/h — ABNORMAL HIGH (ref 0–30)

## 2023-06-27 LAB — GLUCOSE 6 PHOSPHATE DEHYDROGENASE: G-6PDH: 18.8 U/g{Hb} (ref 7.0–20.5)

## 2023-06-27 LAB — C3 AND C4
C3 Complement: 154 mg/dL (ref 83–193)
C4 Complement: 29 mg/dL (ref 15–57)

## 2023-06-27 LAB — ANTI-SCLERODERMA ANTIBODY: Scleroderma (Scl-70) (ENA) Antibody, IgG: <1 AI

## 2023-06-27 LAB — ANA: Anti Nuclear Antibody (ANA): POSITIVE — AB

## 2023-06-27 LAB — CK: Total CK: 60 U/L (ref 29–143)

## 2023-06-28 NOTE — Progress Notes (Signed)
ANA is low titer positive, sed rate is mildly elevated.  All other labs are within normal limits.  I will discuss results at the follow-up visit.

## 2023-07-13 NOTE — Progress Notes (Signed)
Office Visit Note  Patient: Gloria Hall             Date of Birth: 1971/05/23           MRN: [id number]             PCP: Patient, No Pcp Per Referring: Norm Salt, PA Visit Date: 07/27/2023 Occupation: @GUAROCC @  Subjective:  Pain in multiple joints  History of Present Illness: Gloria Hall is a 53 y.o. female returns today after her initial visit on July 02, 2023.  She states she continues to have pain and discomfort in her lower back, both knees and her feet.  She states that her feet swell at times and had difficulty wearing her shoes.  None of the other joints are swollen.  She continues to have pain and discomfort in her shoulders, hands and her elbows.  She also has generalized pain and discomfort in her muscles.  She is scheduled to have left total knee replacement on August 04, 2023 by Dr. Dion Saucier.  She has been taking tramadol 50 mg p.o. 3 times daily for pain management.  She is also on meloxicam 15 mg daily and methocarbamol 750 mg 3 times a day.      Activities of Daily Living:  Patient reports morning stiffness for 24 hours.   Patient Reports nocturnal pain.  Difficulty dressing/grooming: Reports Difficulty climbing stairs: Reports Difficulty getting out of chair: Reports Difficulty using hands for taps, buttons, cutlery, and/or writing: Reports  Review of Systems  Constitutional:  Positive for fatigue.  HENT:  Positive for mouth dryness. Negative for mouth sores.   Eyes:  Negative for dryness.  Respiratory:  Negative for shortness of breath.   Cardiovascular:  Negative for chest pain and palpitations.  Gastrointestinal:  Negative for blood in stool, constipation and diarrhea.  Endocrine: Negative for increased urination.  Genitourinary:  Positive for involuntary urination.  Musculoskeletal:  Positive for joint pain, joint pain, joint swelling, myalgias, morning stiffness, muscle tenderness and myalgias. Negative for gait problem and muscle weakness.   Skin:  Negative for color change, rash, hair loss and sensitivity to sunlight.  Allergic/Immunologic: Negative for susceptible to infections.  Neurological:  Positive for headaches. Negative for dizziness.  Hematological:  Negative for swollen glands.  Psychiatric/Behavioral:  Positive for depressed mood and sleep disturbance. The patient is nervous/anxious.     PMFS History:  Patient Active Problem List   Diagnosis Date Noted   Palpitations    Hypertension    Anemia    Perimenopausal vasomotor symptoms 05/01/2016   DUB (dysfunctional uterine bleeding) 07/19/2014   Bilateral carpal tunnel syndrome 04/12/2014   Facial cellulitis 05/03/2012   Anxiety 05/03/2012   LSIL (low grade squamous intraepithelial lesion) on Pap smear 08/21/2011    Past Medical History:  Diagnosis Date   Anemia    Anxiety    Arthritis    Back pain, chronic    Bronchitis    Hypertension    IBS (irritable bowel syndrome)    LSIL (low grade squamous intraepithelial lesion) on Pap smear 08/21/2011   Migraines    Palpitations    Peripheral neuropathic pain    in hands    Family History  Problem Relation Age of Onset   Arthritis Mother    Diabetes Mother        75   Hypertension Mother    Hyperlipidemia Mother    Fibromyalgia Mother    Sleep apnea Mother    Migraines Mother  Heart attack Father        x7   Heart Problems Father    Diabetes Father    Heart Problems Sister    Hypertension Sister    Arthritis Sister    Diabetes Maternal Aunt    Cancer Maternal Aunt        breast, metastatic cancer throughout body   Breast cancer Maternal Aunt    Crohn's disease Maternal Aunt    Cancer Maternal Uncle        leukemia   Cancer Maternal Uncle        colon   Diabetes Maternal Grandmother    Heart attack Maternal Grandfather 82   Heart attack Paternal Grandfather    Healthy Son    Healthy Daughter    Healthy Daughter    Past Surgical History:  Procedure Laterality Date   COLONOSCOPY   04/17/2023   Dr. Dulce Sellar at [facility] AND CURETTAGE OF UTERUS     dilation and curretage     after a miscarriage   ENDOMETRIAL ABLATION     KNEE ARTHROSCOPY Left 12/01/2013   LAPAROSCOPY  03/19/2011   Procedure: LAPAROSCOPY OPERATIVE;  Surgeon: Hollie Salk C. Marice Potter, MD;  Location: [facility];  Service: Gynecology;  Laterality: N/A;   NOVASURE ABLATION  03/19/2011   Procedure: NOVASURE ABLATION;  Surgeon: Hollie Salk C. Marice Potter, MD;  Location: [facility];  Service: Gynecology;  Laterality: N/A;   TUBAL LIGATION  1993   Social History   Social History Narrative   Patient lives at home with spouse.   Caffeine Use: 2 cups of coffee and 2 cups of tea daily    There is no immunization history on file for this patient.   Objective: Vital Signs: BP 122/85 (BP Location: Left Arm, Patient Position: Sitting, Cuff Size: Large)   Pulse 86   Resp 14   Ht 5\' 3"  (1.6 m)   Wt 228 lb (103.4 kg)   LMP 07/04/2014   BMI 40.39 kg/m    Physical Exam Vitals and nursing note reviewed.  Constitutional:      Appearance: She is well-developed.  HENT:     Head: Normocephalic and atraumatic.  Eyes:     Conjunctiva/sclera: Conjunctivae normal.  Cardiovascular:     Rate and Rhythm: Normal rate and regular rhythm.     Heart sounds: Normal heart sounds.  Pulmonary:     Effort: Pulmonary effort is normal.     Breath sounds: Normal breath sounds.  Abdominal:     General: Bowel sounds are normal.     Palpations: Abdomen is soft.  Musculoskeletal:     Cervical back: Normal range of motion.  Lymphadenopathy:     Cervical: No cervical adenopathy.  Skin:    General: Skin is warm and dry.     Capillary Refill: Capillary refill takes less than 2 seconds.  Neurological:     Mental Status: She is alert and oriented to person, place, and time.  Psychiatric:        Behavior: Behavior normal.      Musculoskeletal Exam: She has good range of motion cervical spine.  She had painful limited range of motion of the lumbar  spine.  She had shoulder joint good range of motion with discomfort.  Elbow joints were in good range of motion without any synovitis.  There was tenderness over trapezius region and over bilateral lateral epicondyle region.  Wrist joints, MCPs PIPs and DIPs with good range of motion with no synovitis.  Hip  joints were in good range of motion.  She had tenderness over bilateral trochanteric bursa.  She had painful range of motion of bilateral knee joints with limited extension.  There was no tenderness over ankles or MTPs.  Generalized hyperalgesia and positive tender points were present.  CDAI Exam: CDAI Score: -- Patient Global: --; Provider Global: -- Swollen: --; Tender: -- Joint Exam 07/27/2023   No joint exam has been documented for this visit   There is currently no information documented on the homunculus. Go to the Rheumatology activity and complete the homunculus joint exam.  Investigation: No additional findings.  Imaging: No results found.  Recent Labs: Lab Results  Component Value Date   WBC 8.3 06/24/2023   HGB 12.4 06/24/2023   PLT 354 06/24/2023   NA 139 06/24/2023   K 4.1 06/24/2023   CL 103 06/24/2023   CO2 27 06/24/2023   GLUCOSE 78 06/24/2023   BUN 9 06/24/2023   CREATININE 0.68 06/24/2023   BILITOT 0.6 06/24/2023   ALKPHOS 70 10/05/2012   AST 15 06/24/2023   ALT 18 06/24/2023   PROT 7.2 06/24/2023   ALBUMIN 3.4 (L) 10/05/2012   CALCIUM 9.5 06/24/2023   GFRAA >60 08/04/2017   June 24, 2023 ANA 1: 80 NS, 1: 80 NH, ENA (dsDNA, SSA, SSB, Smith, RNP, SCL 70) negative, C3-C4 normal, beta-2 GP 1 negative, anticardiolipin negative, urine protein creatinine ratio normal, RF negative, anti-CCP negative, CK 60, TSH normal, ESR 33, G6PD normal  Speciality Comments: No specialty comments available.  Procedures:  No procedures performed Allergies: Imitrex [sumatriptan]   Assessment / Plan:     Visit Diagnoses: Polyarthralgia - Patient has been experiencing  pain and discomfort in multiple joints.  No synovitis was noted on the examination.  She continues to have generalized pain and discomfort.  Positive ANA (antinuclear antibody) - June 24, 2023 ANA 1: 80 NS, 1: 80 NH, ENA (dsDNA, SSA, SSB, Smith, RNP, SCL 70) negative, C3-C4 normal, beta-2 GP 1 negative, anticardiolipin negative, urine protein creatinine ratio normal,ANA low titer positive, ENA negative, complements normal.  History of sicca symptoms.  SSA negative, SSB negative.  Lab results were reviewed with the patient.  Sicca symptoms most likely related to medication use.  Chronic pain of both shoulders -she can use to have discomfort in her shoulders.  X-rays obtained at the last visit were unremarkable.  X-ray results were reviewed with the patient.  A handout on shoulder exercises was given.  Pain in both hands -she complains of pain and discomfort in her bilateral hands.  No synovitis was noted.  X-rays obtained at the last visit were unremarkable.  X-ray findings were reviewed with the patient.  RF negative, anti-CCP negative.  Sed rate was mildly elevated.  Bilateral carpal tunnel syndrome - Patient wears Postinor braces.  Trochanteric bursitis of both hips - She had tenderness over bilateral trochanteric bursa.  IT band stretches were advised.  Primary osteoarthritis of both knees - According to the patient she has severe osteoarthritis and was advised total knee replacement by Dr. Dion Saucier.left total knee replacement is a scheduled for August 04, 2023 by Dr. Dion Saucier per patient.  She has been taking combination of tramadol 50 mg 3 times daily, meloxicam 15 mg p.o. daily and methocarbamol 7 mg 3 times daily for pain management.  Pain in both feet - History of plantar fasciitis and osteoarthritis.  X-rays showed early osteoarthritic changes.  Patient gives history of intermittent swelling in her feet.  No synovitis  was noted.  X-rays also reviewed with the patient.  Arthropathy of  lumbar facet joint -she could use to have lower back pain.  X-ray showed lumbar facet joint arthropathy.  Patient reports finishing physical therapy recently for the lower back pain.  Neck pain-she cannot history of neck stiffness.  Pain is most likely coming from the trapezius spasm.  Range of motion exercises were discussed.  Other fatigue-most likely related to fibromyalgia.  Fibromyalgia - Patient had generalized hyperalgesia and positive tender points.  CK normal.  Detailed counsel regarding fibromyalgia was provided.  Need for regular exercise and stretching was emphasized.  Patient goals for water exercises.  Irritable bowel syndrome with diarrhea-it is not unusual to see IBS in association fibromyalgia.  Other medical problems are listed as follows:  Palpitations  Anxiety  History of anemia  Perimenopausal vasomotor symptoms  Family history of fibromyalgia-mother  Family history of systemic lupus erythematosus-maternal first cousin  Family history of Crohn's disease-maternal aunt  Orders: No orders of the defined types were placed in this encounter.  No orders of the defined types were placed in this encounter.    Follow-Up Instructions: Return if symptoms worsen or fail to improve, for Osteoarthritis.   Pollyann Savoy, MD  Note - This record has been created using Animal nutritionist.  Chart creation errors have been sought, but may not always  have been located. Such creation errors do not reflect on  the standard of medical care.

## 2023-07-20 ENCOUNTER — Ambulatory Visit: Payer: Self-pay | Admitting: Orthopedic Surgery

## 2023-07-20 NOTE — Progress Notes (Signed)
Surgery orders requested via Epic inbox. °

## 2023-07-20 NOTE — H&P (Signed)
  [facility] [address]   [address] [place], [place] [address] [phone] A Division of [facility]  Jewel Baize. Eulah Pont, M.D.   Eulas Post, M.D.  Ramond Marrow, M.D.  Weber Cooks, M.D.  Melina Fiddler, M.D.  Buford Dresser, M.D.  Luis Abed, D.O. Burnell Blanks, M.D.   Estell Harpin, M.D.        Josh Chadwell, PA-C   [place], PA-C   [place], PA-C   [place], PA-C   [place], [place]   RE: Gloria Hall   [id number]   03-08-2070 PROGRESS NOTE: 03-23-23 History: Gloria Hall is a 52 year-old female coming in for follow up on her bilateral knee arthritis.  Most recent injections were performed on March 02, 2023 which did help, but she continues to have pain.  She has been using Tramadol and Tylenol, but continues to have bilateral knee sharp and achy pain that is diffuse.  She has to walk with a cane sometimes.  She feels like her left is now worse than her right.  She has been out of work as a Lawyer, as she is unable to walk or stand for long periods of time.  She is hoping today to get scheduled for a knee replacement.  She has been working on weight loss to get her BMI below 40.  She would like to do her left before her right.  She has so far for both knees tried cortisone injections, Tylenol Arthritis, Tramadol, Voltaren gel, ice and braces without significant relief of her symptoms.  She has a history of a left knee arthroscopy with meniscectomy. Please see associated documentation for this clinic visit for further past medical, family, social history and review of systems.   EXAMINATION: On exam she has 0-100 degrees range of motion of the right knee.  Positive medial joint line tenderness.  10-95 degrees range of motion of the left knee with positive medial and lateral joint line tenderness.  She does have a small scar on the anterosuperior lateral aspect of the right knee which she states  is from a previous laceration repair when she was a child.  This is well healed.    X-RAYS: Reviewed previous x-rays showing bilateral knee severe osteoarthritis, right is a little worse than left on x-ray.    PLAN: Although her right knee is worse than her left she really feels like her left knee is more dysfunctional for her.  The risks, benefits and alternatives of a left total knee replacement have been discussed with Gloria Hall and she would like to move forward with surgery.  We will plan to get the surgical scheduling process started with the knowledge that she will not be able to be scheduled until she is at least two months out from her last cortisone injection and will need to have a weight check at her next visit to make sure she is below the 40 BMI cutoff.  She is aware of these restrictions.  We will have speak with Cordelia Pen, our scheduler, today.  Patient co-evaluated with Dr. Dion Saucier today who is in agreement with the treatment plan.  Eulas Post, M.D.   Dictated by: Janine Ores, PA-C Electronically verified by Eulas Post, M.D. JPL([id number]):[id number] Cc: Norva Riffle, PA-C, fax: [phone] D 03-24-23 T 03-28-23

## 2023-07-21 NOTE — Progress Notes (Addendum)
Anesthesia Review:  PCP: [facility] on [address] Called and [facility] for Schedulers and requested clearance.   Cardiologist : DR  Standley Brooking,  NP LOV 04/03/23 clearance in Media dated 07/27/23- Clearance dated 04/27/23.  Rheumatology- DR Corliss Skains- 07/27/23 - LOV  Chest x-ray : EKG : 04/03/23  Echo : 04/27/23  CT Card- 2022  Stress test: Cardiac Cath :  Activity level: can do a flight of stairs without difficutly  Sleep Study/ CPAP : has sleep apnea no cpap  Fasting Blood Sugar :      / Checks Blood Sugar -- times a day:   Blood Thinner/ Instructions /Last Dose: ASA / Instructions/ Last Dose :

## 2023-07-22 NOTE — Patient Instructions (Signed)
SURGICAL WAITING ROOM VISITATION  Patients having surgery or a procedure may have no more than 2 support people in the waiting area - these visitors may rotate.    Children under the age of 57 must have an adult with them who is not the patient.  Due to an increase in RSV and influenza rates and associated hospitalizations, children ages 32 and under may not visit patients in [facility] hospitals.  If the patient needs to stay at the hospital during part of their recovery, the visitor guidelines for inpatient rooms apply. Pre-op nurse will coordinate an appropriate time for 1 support person to accompany patient in pre-op.  This support person may not rotate.    Please refer to the [facility] website for the visitor guidelines for Inpatients (after your surgery is over and you are in a regular room).       Your procedure is scheduled on:  08/04/23    Report to [facility] Main Entrance    Report to admitting at  1100 AM   Call this number if you have problems the morning of surgery [phone]   Do not eat food :After Midnight.   After Midnight you may have the following liquids until __ 1030____ AM DAY OF SURGERY  Water Non-Citrus Juices (without pulp, NO RED-Apple, White grape, White cranberry) Black Coffee (NO MILK/CREAM OR CREAMERS, sugar ok)  Clear Tea (NO MILK/CREAM OR CREAMERS, sugar ok) regular and decaf                             Plain Jell-O (NO RED)                                           Fruit ices (not with fruit pulp, NO RED)                                     Popsicles (NO RED)                                                               Sports drinks like Gatorade (NO RED)                  The day of surgery:  Drink ONE (1) Pre-Surgery Clear Ensure or G2 at  1030AM ( have completed by )  the morning of surgery. Drink in one sitting. Do not sip.  This drink was given to you during your hospital  pre-op appointment visit. Nothing else to drink  after completing the  Pre-Surgery Clear Ensure or G2.          If you have questions, please contact your surgeon's office.      Oral Hygiene is also important to reduce your risk of infection.                                    Remember - BRUSH YOUR TEETH THE MORNING OF SURGERY WITH YOUR REGULAR TOOTHPASTE  DENTURES WILL BE REMOVED PRIOR TO SURGERY PLEASE DO NOT APPLY "Poly grip" OR ADHESIVES!!!   Do NOT smoke after Midnight   Stop all vitamins and herbal supplements 7 days before surgery.   Take these medicines the morning of surgery with A SIP OF WATER:  buspar, synthroid, paxil, omeprazole if needed , lyrica   DO NOT TAKE ANY ORAL DIABETIC MEDICATIONS DAY OF YOUR SURGERY  Bring CPAP mask and tubing day of surgery.                              You may not have any metal on your body including hair pins, jewelry, and body piercing             Do not wear make-up, lotions, powders, perfumes/cologne, or deodorant  Do not wear nail polish including gel and S&S, artificial/acrylic nails, or any other type of covering on natural nails including finger and toenails. If you have artificial nails, gel coating, etc. that needs to be removed by a nail salon please have this removed prior to surgery or surgery may need to be canceled/ delayed if the surgeon/ anesthesia feels like they are unable to be safely monitored.   Do not shave  48 hours prior to surgery.               Men may shave face and neck.   Do not bring valuables to the hospital. [facility] IS NOT             RESPONSIBLE   FOR VALUABLES.   Contacts, glasses, dentures or bridgework may not be worn into surgery.   Bring small overnight bag day of surgery.   DO NOT BRING YOUR HOME MEDICATIONS TO THE HOSPITAL. PHARMACY WILL DISPENSE MEDICATIONS LISTED ON YOUR MEDICATION LIST TO YOU DURING YOUR ADMISSION IN THE HOSPITAL!    Patients discharged on the day of surgery will not be allowed to drive home.  Someone NEEDS to stay  with you for the first 24 hours after anesthesia.   Special Instructions: Bring a copy of your healthcare power of attorney and living will documents the day of surgery if you haven't scanned them before.              Please read over the following fact sheets you were given: IF YOU HAVE QUESTIONS ABOUT YOUR PRE-OP INSTRUCTIONS PLEASE CALL [phone]   If you received a COVID test during your pre-op visit  it is requested that you wear a mask when out in public, stay away from anyone that may not be feeling well and notify your surgeon if you develop symptoms. If you test positive for Covid or have been in contact with anyone that has tested positive in the last 10 days please notify you surgeon.      Pre-operative 5 CHG Bath Instructions   You can play a key role in reducing the risk of infection after surgery. Your skin needs to be as free of germs as possible. You can reduce the number of germs on your skin by washing with CHG (chlorhexidine gluconate) soap before surgery. CHG is an antiseptic soap that kills germs and continues to kill germs even after washing.   DO NOT use if you have an allergy to chlorhexidine/CHG or antibacterial soaps. If your skin becomes reddened or irritated, stop using the CHG and notify one of our RNs at [phone].   Please shower with the  CHG soap starting 4 days before surgery using the following schedule:     Please keep in mind the following:  DO NOT shave, including legs and underarms, starting the day of your first shower.   You may shave your face at any point before/day of surgery.  Place clean sheets on your bed the day you start using CHG soap. Use a clean washcloth (not used since being washed) for each shower. DO NOT sleep with pets once you start using the CHG.   CHG Shower Instructions:  If you choose to wash your hair and private area, wash first with your normal shampoo/soap.  After you use shampoo/soap, rinse your hair and body  thoroughly to remove shampoo/soap residue.  Turn the water OFF and apply about 3 tablespoons (45 ml) of CHG soap to a CLEAN washcloth.  Apply CHG soap ONLY FROM YOUR NECK DOWN TO YOUR TOES (washing for 3-5 minutes)  DO NOT use CHG soap on face, private areas, open wounds, or sores.  Pay special attention to the area where your surgery is being performed.  If you are having back surgery, having someone wash your back for you may be helpful. Wait 2 minutes after CHG soap is applied, then you may rinse off the CHG soap.  Pat dry with a clean towel  Put on clean clothes/pajamas   If you choose to wear lotion, please use ONLY the CHG-compatible lotions on the back of this paper.     Additional instructions for the day of surgery: DO NOT APPLY any lotions, deodorants, cologne, or perfumes.   Put on clean/comfortable clothes.  Brush your teeth.  Ask your nurse before applying any prescription medications to the skin.      CHG Compatible Lotions   Aveeno Moisturizing lotion  Cetaphil Moisturizing Cream  Cetaphil Moisturizing Lotion  Clairol Herbal Essence Moisturizing Lotion, Dry Skin  Clairol Herbal Essence Moisturizing Lotion, Extra Dry Skin  Clairol Herbal Essence Moisturizing Lotion, Normal Skin  Curel Age Defying Therapeutic Moisturizing Lotion with Alpha Hydroxy  Curel Extreme Care Body Lotion  Curel Soothing Hands Moisturizing Hand Lotion  Curel Therapeutic Moisturizing Cream, Fragrance-Free  Curel Therapeutic Moisturizing Lotion, Fragrance-Free  Curel Therapeutic Moisturizing Lotion, Original Formula  Eucerin Daily Replenishing Lotion  Eucerin Dry Skin Therapy Plus Alpha Hydroxy Crme  Eucerin Dry Skin Therapy Plus Alpha Hydroxy Lotion  Eucerin Original Crme  Eucerin Original Lotion  Eucerin Plus Crme Eucerin Plus Lotion  Eucerin TriLipid Replenishing Lotion  Keri Anti-Bacterial Hand Lotion  Keri Deep Conditioning Original Lotion Dry Skin Formula Softly Scented  Keri  Deep Conditioning Original Lotion, Fragrance Free Sensitive Skin Formula  Keri Lotion Fast Absorbing Fragrance Free Sensitive Skin Formula  Keri Lotion Fast Absorbing Softly Scented Dry Skin Formula  Keri Original Lotion  Keri Skin Renewal Lotion Keri Silky Smooth Lotion  Keri Silky Smooth Sensitive Skin Lotion  Nivea Body Creamy Conditioning Oil  Nivea Body Extra Enriched Teacher, adult education Moisturizing Lotion Nivea Crme  Nivea Skin Firming Lotion  NutraDerm 30 Skin Lotion  NutraDerm Skin Lotion  NutraDerm Therapeutic Skin Cream  NutraDerm Therapeutic Skin Lotion  ProShield Protective Hand Cream  Provon moisturizing lotion

## 2023-07-27 ENCOUNTER — Encounter: Payer: Self-pay | Admitting: Rheumatology

## 2023-07-27 ENCOUNTER — Ambulatory Visit: Payer: Medicaid Other | Attending: Rheumatology | Admitting: Rheumatology

## 2023-07-27 ENCOUNTER — Other Ambulatory Visit: Payer: Self-pay

## 2023-07-27 ENCOUNTER — Encounter (HOSPITAL_COMMUNITY)
Admission: RE | Admit: 2023-07-27 | Discharge: 2023-07-27 | Disposition: A | Discharge status: Home / Self Care | Payer: Medicaid Other | Source: Ambulatory Visit | Attending: Orthopedic Surgery | Admitting: Orthopedic Surgery

## 2023-07-27 ENCOUNTER — Encounter (HOSPITAL_COMMUNITY): Payer: Self-pay

## 2023-07-27 VITALS — BP 122/85 | HR 86 | Resp 14 | Ht 63.0 in | Wt 228.0 lb

## 2023-07-27 VITALS — BP 137/96 | HR 88 | Temp 98.4°F | Resp 16 | Ht 63.25 in | Wt 225.0 lb

## 2023-07-27 DIAGNOSIS — K580 Irritable bowel syndrome with diarrhea: Secondary | ICD-10-CM

## 2023-07-27 DIAGNOSIS — G8929 Other chronic pain: Secondary | ICD-10-CM

## 2023-07-27 DIAGNOSIS — I471 Supraventricular tachycardia, unspecified: Secondary | ICD-10-CM | POA: Diagnosis not present

## 2023-07-27 DIAGNOSIS — I4719 Other supraventricular tachycardia: Secondary | ICD-10-CM | POA: Diagnosis not present

## 2023-07-27 DIAGNOSIS — F32A Depression, unspecified: Secondary | ICD-10-CM | POA: Insufficient documentation

## 2023-07-27 DIAGNOSIS — M791 Myalgia, unspecified site: Secondary | ICD-10-CM

## 2023-07-27 DIAGNOSIS — M79642 Pain in left hand: Secondary | ICD-10-CM

## 2023-07-27 DIAGNOSIS — R002 Palpitations: Secondary | ICD-10-CM | POA: Diagnosis not present

## 2023-07-27 DIAGNOSIS — Z8379 Family history of other diseases of the digestive system: Secondary | ICD-10-CM

## 2023-07-27 DIAGNOSIS — Z01812 Encounter for preprocedural laboratory examination: Secondary | ICD-10-CM | POA: Insufficient documentation

## 2023-07-27 DIAGNOSIS — R768 Other specified abnormal immunological findings in serum: Secondary | ICD-10-CM

## 2023-07-27 DIAGNOSIS — M7062 Trochanteric bursitis, left hip: Secondary | ICD-10-CM

## 2023-07-27 DIAGNOSIS — M79641 Pain in right hand: Secondary | ICD-10-CM | POA: Diagnosis not present

## 2023-07-27 DIAGNOSIS — E039 Hypothyroidism, unspecified: Secondary | ICD-10-CM | POA: Diagnosis not present

## 2023-07-27 DIAGNOSIS — M47816 Spondylosis without myelopathy or radiculopathy, lumbar region: Secondary | ICD-10-CM

## 2023-07-27 DIAGNOSIS — Z862 Personal history of diseases of the blood and blood-forming organs and certain disorders involving the immune mechanism: Secondary | ICD-10-CM

## 2023-07-27 DIAGNOSIS — F419 Anxiety disorder, unspecified: Secondary | ICD-10-CM | POA: Insufficient documentation

## 2023-07-27 DIAGNOSIS — M79671 Pain in right foot: Secondary | ICD-10-CM

## 2023-07-27 DIAGNOSIS — M2550 Pain in unspecified joint: Secondary | ICD-10-CM

## 2023-07-27 DIAGNOSIS — G43909 Migraine, unspecified, not intractable, without status migrainosus: Secondary | ICD-10-CM | POA: Insufficient documentation

## 2023-07-27 DIAGNOSIS — G5603 Carpal tunnel syndrome, bilateral upper limbs: Secondary | ICD-10-CM

## 2023-07-27 DIAGNOSIS — Z87891 Personal history of nicotine dependence: Secondary | ICD-10-CM | POA: Diagnosis not present

## 2023-07-27 DIAGNOSIS — M25511 Pain in right shoulder: Secondary | ICD-10-CM

## 2023-07-27 DIAGNOSIS — M542 Cervicalgia: Secondary | ICD-10-CM

## 2023-07-27 DIAGNOSIS — Z8269 Family history of other diseases of the musculoskeletal system and connective tissue: Secondary | ICD-10-CM

## 2023-07-27 DIAGNOSIS — M25512 Pain in left shoulder: Secondary | ICD-10-CM

## 2023-07-27 DIAGNOSIS — K219 Gastro-esophageal reflux disease without esophagitis: Secondary | ICD-10-CM | POA: Diagnosis not present

## 2023-07-27 DIAGNOSIS — M797 Fibromyalgia: Secondary | ICD-10-CM | POA: Diagnosis not present

## 2023-07-27 DIAGNOSIS — M79672 Pain in left foot: Secondary | ICD-10-CM

## 2023-07-27 DIAGNOSIS — R5383 Other fatigue: Secondary | ICD-10-CM

## 2023-07-27 DIAGNOSIS — M7061 Trochanteric bursitis, right hip: Secondary | ICD-10-CM

## 2023-07-27 DIAGNOSIS — N951 Menopausal and female climacteric states: Secondary | ICD-10-CM

## 2023-07-27 DIAGNOSIS — M170 Bilateral primary osteoarthritis of knee: Secondary | ICD-10-CM

## 2023-07-27 DIAGNOSIS — Z01818 Encounter for other preprocedural examination: Secondary | ICD-10-CM

## 2023-07-27 HISTORY — DX: Gastro-esophageal reflux disease without esophagitis: K21.9

## 2023-07-27 HISTORY — DX: Hypothyroidism, unspecified: E03.9

## 2023-07-27 HISTORY — DX: Palpitations: R00.2

## 2023-07-27 HISTORY — DX: Fibromyalgia: M79.7

## 2023-07-27 HISTORY — DX: Sleep apnea, unspecified: G47.30

## 2023-07-27 HISTORY — DX: Depression, unspecified: F32.A

## 2023-07-27 LAB — BASIC METABOLIC PANEL
Anion gap: 8 (ref 5–15)
BUN: 15 mg/dL (ref 6–20)
CO2: 23 mmol/L (ref 22–32)
Calcium: 9 mg/dL (ref 8.9–10.3)
Chloride: 104 mmol/L (ref 98–111)
Creatinine, Ser: 0.68 mg/dL (ref 0.44–1.00)
GFR, Estimated: >60 mL/min (ref >60)
Glucose, Bld: 96 mg/dL (ref 70–99)
Potassium: 3.5 mmol/L (ref 3.5–5.1)
Sodium: 135 mmol/L (ref 135–145)

## 2023-07-27 LAB — CBC
HCT: 38.9 % (ref 36.0–46.0)
Hemoglobin: 12.2 g/dL (ref 12.0–15.0)
MCH: 26.3 pg (ref 26.0–34.0)
MCHC: 31.4 g/dL (ref 30.0–36.0)
MCV: 83.8 fL (ref 80.0–100.0)
Platelets: 364 10*3/uL (ref 150–400)
RBC: 4.64 MIL/uL (ref 3.87–5.11)
RDW: 14.4 % (ref 11.5–15.5)
WBC: 9.7 10*3/uL (ref 4.0–10.5)
nRBC: 0 % (ref 0.0–0.2)

## 2023-07-27 LAB — SURGICAL PCR SCREEN
MRSA, PCR: NEGATIVE
Staphylococcus aureus: NEGATIVE

## 2023-07-27 NOTE — Patient Instructions (Addendum)
Hand Exercises Hand exercises can be helpful for almost anyone. They can strengthen your hands and improve flexibility and movement. The exercises can also increase blood flow to the hands. These results can make your work and daily tasks easier for you. Hand exercises can be especially helpful for people who have joint pain from arthritis or nerve damage from using their hands over and over. These exercises can also help people who injure a hand. Exercises Most of these hand exercises are gentle stretching and motion exercises. It is usually safe to do them often throughout the day. Warming up your hands before exercise may help reduce stiffness. You can do this with gentle massage or by placing your hands in warm water for 10-15 minutes. It is normal to feel some stretching, pulling, tightness, or mild discomfort when you begin new exercises. In time, this will improve. Remember to always be careful and stop right away if you feel sudden, very bad pain or your pain gets worse. You want to get better and be safe. Ask your health care provider which exercises are safe for you. Do exercises exactly as told by your provider and adjust them as told. Do not begin these exercises until told by your provider. Knuckle bend or "claw" fist  Stand or sit with your arm, hand, and all five fingers pointed straight up. Make sure to keep your wrist straight. Gently bend your fingers down toward your palm until the tips of your fingers are touching your palm. Keep your big knuckle straight and only bend the small knuckles in your fingers. Hold this position for 10 seconds. Straighten your fingers back to your starting position. Repeat this exercise 5-10 times with each hand. Full finger fist  Stand or sit with your arm, hand, and all five fingers pointed straight up. Make sure to keep your wrist straight. Gently bend your fingers into your palm until the tips of your fingers are touching the middle of your  palm. Hold this position for 10 seconds. Extend your fingers back to your starting position, stretching every joint fully. Repeat this exercise 5-10 times with each hand. Straight fist  Stand or sit with your arm, hand, and all five fingers pointed straight up. Make sure to keep your wrist straight. Gently bend your fingers at the big knuckle, where your fingers meet your hand, and at the middle knuckle. Keep the knuckle at the tips of your fingers straight and try to touch the bottom of your palm. Hold this position for 10 seconds. Extend your fingers back to your starting position, stretching every joint fully. Repeat this exercise 5-10 times with each hand. Tabletop  Stand or sit with your arm, hand, and all five fingers pointed straight up. Make sure to keep your wrist straight. Gently bend your fingers at the big knuckle, where your fingers meet your hand, as far down as you can. Keep the small knuckles in your fingers straight. Think of forming a tabletop with your fingers. Hold this position for 10 seconds. Extend your fingers back to your starting position, stretching every joint fully. Repeat this exercise 5-10 times with each hand. Finger spread  Place your hand flat on a table with your palm facing down. Make sure your wrist stays straight. Spread your fingers and thumb apart from each other as far as you can until you feel a gentle stretch. Hold this position for 10 seconds. Bring your fingers and thumb tight together again. Hold this position for 10 seconds. Repeat  this exercise 5-10 times with each hand. Making circles  Stand or sit with your arm, hand, and all five fingers pointed straight up. Make sure to keep your wrist straight. Make a circle by touching the tip of your thumb to the tip of your index finger. Hold for 10 seconds. Then open your hand wide. Repeat this motion with your thumb and each of your fingers. Repeat this exercise 5-10 times with each hand. Thumb  motion  Sit with your forearm resting on a table and your wrist straight. Your thumb should be facing up toward the ceiling. Keep your fingers relaxed as you move your thumb. Lift your thumb up as high as you can toward the ceiling. Hold for 10 seconds. Bend your thumb across your palm as far as you can, reaching the tip of your thumb for the small finger (pinkie) side of your palm. Hold for 10 seconds. Repeat this exercise 5-10 times with each hand. Grip strengthening  Hold a stress ball or other soft ball in the middle of your hand. Slowly increase the pressure, squeezing the ball as much as you can without causing pain. Think of bringing the tips of your fingers into the middle of your palm. All of your finger joints should bend when doing this exercise. Hold your squeeze for 10 seconds, then relax. Repeat this exercise 5-10 times with each hand. Contact a health care provider if: Your hand pain or discomfort gets much worse when you do an exercise. Your hand pain or discomfort does not improve within 2 hours after you exercise. If you have either of these problems, stop doing these exercises right away. Do not do them again unless your provider says that you can. Get help right away if: You develop sudden, severe hand pain or swelling. If this happens, stop doing these exercises right away. Do not do them again unless your provider says that you can. This information is not intended to replace advice given to you by your health care provider. Make sure you discuss any questions you have with your health care provider. Document Revised: 08/05/2022 Document Reviewed: 08/05/2022 Elsevier Patient Education  2024 [employer]  Shoulder Exercises Ask your health care provider which exercises are safe for you. Do exercises exactly as told by your health care provider and adjust them as directed. It is normal to feel mild stretching, pulling, tightness, or discomfort as you do these exercises.  Stop right away if you feel sudden pain or your pain gets worse. Do not begin these exercises until told by your health care provider. Stretching exercises External rotation and abduction This exercise is sometimes called corner stretch. The exercise rotates your arm outward (external rotation) and moves your arm out from your body (abduction). Stand in a doorway with one of your feet slightly in front of the other. This is called a staggered stance. If you cannot reach your forearms to the door frame, stand facing a corner of a room. Choose one of the following positions as told by your health care provider: Place your hands and forearms on the door frame above your head. Place your hands and forearms on the door frame at the height of your head. Place your hands on the door frame at the height of your elbows. Slowly move your weight onto your front foot until you feel a stretch across your chest and in the front of your shoulders. Keep your head and chest upright and keep your abdominal muscles tight. Hold for  __________ seconds. To release the stretch, shift your weight to your back foot. Repeat __________ times. Complete this exercise __________ times a day. Extension, standing  Stand and hold a broomstick, a cane, or a similar object behind your back. Your hands should be a little wider than shoulder-width apart. Your palms should face away from your back. Keeping your elbows straight and your shoulder muscles relaxed, move the stick away from your body until you feel a stretch in your shoulders (extension). Avoid shrugging your shoulders while you move the stick. Keep your shoulder blades tucked down toward the middle of your back. Hold for __________ seconds. Slowly return to the starting position. Repeat __________ times. Complete this exercise __________ times a day. Range-of-motion exercises Pendulum  Stand near a wall or a surface that you can hold onto for balance. Bend at the  waist and let your left / right arm hang straight down. Use your other arm to support you. Keep your back straight and do not lock your knees. Relax your left / right arm and shoulder muscles, and move your hips and your trunk so your left / right arm swings freely. Your arm should swing because of the motion of your body, not because you are using your arm or shoulder muscles. Keep moving your hips and trunk so your arm swings in the following directions, as told by your health care provider: Side to side. Forward and backward. In clockwise and counterclockwise circles. Continue each motion for __________ seconds, or for as long as told by your health care provider. Slowly return to the starting position. Repeat __________ times. Complete this exercise __________ times a day. Shoulder flexion, standing  Stand and hold a broomstick, a cane, or a similar object. Place your hands a little more than shoulder-width apart on the object. Your left / right hand should be palm-up, and your other hand should be palm-down. Keep your elbow straight and your shoulder muscles relaxed. Push the stick up with your healthy arm to raise your left / right arm in front of your body, and then over your head until you feel a stretch in your shoulder (flexion). Avoid shrugging your shoulder while you raise your arm. Keep your shoulder blade tucked down toward the middle of your back. Hold for __________ seconds. Slowly return to the starting position. Repeat __________ times. Complete this exercise __________ times a day. Shoulder abduction, standing  Stand and hold a broomstick, a cane, or a similar object. Place your hands a little more than shoulder-width apart on the object. Your left / right hand should be palm-up, and your other hand should be palm-down. Keep your elbow straight and your shoulder muscles relaxed. Push the object across your body toward your left / right side. Raise your left / right arm to the  side of your body (abduction) until you feel a stretch in your shoulder. Do not raise your arm above shoulder height unless your health care provider tells you to do that. If directed, raise your arm over your head. Avoid shrugging your shoulder while you raise your arm. Keep your shoulder blade tucked down toward the middle of your back. Hold for __________ seconds. Slowly return to the starting position. Repeat __________ times. Complete this exercise __________ times a day. Internal rotation  Place your left / right hand behind your back, palm-up. Use your other hand to dangle an exercise band, a broomstick, or a similar object over your shoulder. Grasp the band with your left /  right hand so you are holding on to both ends. Gently pull up on the band until you feel a stretch in the front of your left / right shoulder. The movement of your arm toward the center of your body is called internal rotation. Avoid shrugging your shoulder while you raise your arm. Keep your shoulder blade tucked down toward the middle of your back. Hold for __________ seconds. Release the stretch by letting go of the band and lowering your hands. Repeat __________ times. Complete this exercise __________ times a day. Strengthening exercises External rotation  Sit in a stable chair without armrests. Secure an exercise band to a stable object at elbow height on your left / right side. Place a soft object, such as a folded towel or a small pillow, between your left / right upper arm and your body to move your elbow about 4 inches (10 cm) away from your side. Hold the end of the exercise band so it is tight and there is no slack. Keeping your elbow pressed against the soft object, slowly move your forearm out, away from your abdomen (external rotation). Keep your body steady so only your forearm moves. Hold for __________ seconds. Slowly return to the starting position. Repeat __________ times. Complete this  exercise __________ times a day. Shoulder abduction  Sit in a stable chair without armrests, or stand up. Hold a __________ lb / kg weight in your left / right hand, or hold an exercise band with both hands. Start with your arms straight down and your left / right palm facing in, toward your body. Slowly lift your left / right hand out to your side (abduction). Do not lift your hand above shoulder height unless your health care provider tells you that this is safe. Keep your arms straight. Avoid shrugging your shoulder while you do this movement. Keep your shoulder blade tucked down toward the middle of your back. Hold for __________ seconds. Slowly lower your arm, and return to the starting position. Repeat __________ times. Complete this exercise __________ times a day. Shoulder extension  Sit in a stable chair without armrests, or stand up. Secure an exercise band to a stable object in front of you so it is at shoulder height. Hold one end of the exercise band in each hand. Straighten your elbows and lift your hands up to shoulder height. Squeeze your shoulder blades together as you pull your hands down to the sides of your thighs (extension). Stop when your hands are straight down by your sides. Do not let your hands go behind your body. Hold for __________ seconds. Slowly return to the starting position. Repeat __________ times. Complete this exercise __________ times a day. Shoulder row  Sit in a stable chair without armrests, or stand up. Secure an exercise band to a stable object in front of you so it is at chest height. Hold one end of the exercise band in each hand. Position your palms so that your thumbs are facing the ceiling (neutral position). Bend each of your elbows to a 90-degree angle (right angle) and keep your upper arms at your sides. Step back or move the chair back until the band is tight and there is no slack. Slowly pull your elbows back behind you. Hold for  __________ seconds. Slowly return to the starting position. Repeat __________ times. Complete this exercise __________ times a day. Shoulder press-ups  Sit in a stable chair that has armrests. Sit upright, with your feet flat on the  floor. Put your hands on the armrests so your elbows are bent and your fingers are pointing forward. Your hands should be about even with the sides of your body. Push down on the armrests and use your arms to lift yourself off the chair. Straighten your elbows and lift yourself up as much as you comfortably can. Move your shoulder blades down, and avoid letting your shoulders move up toward your ears. Keep your feet on the ground. As you get stronger, your feet should support less of your body weight as you lift yourself up. Hold for __________ seconds. Slowly lower yourself back into the chair. Repeat __________ times. Complete this exercise __________ times a day. Wall push-ups  Stand so you are facing a stable wall. Your feet should be about one arm-length away from the wall. Lean forward and place your palms on the wall at shoulder height. Keep your feet flat on the floor as you bend your elbows and lean forward toward the wall. Hold for __________ seconds. Straighten your elbows to push yourself back to the starting position. Repeat __________ times. Complete this exercise __________ times a day. This information is not intended to replace advice given to you by your health care provider. Make sure you discuss any questions you have with your health care provider. Document Revised: 09/10/2021 Document Reviewed: 09/10/2021 Elsevier Patient Education  2024 [employer].

## 2023-07-30 ENCOUNTER — Encounter (HOSPITAL_COMMUNITY): Payer: Self-pay

## 2023-07-30 NOTE — Anesthesia Preprocedure Evaluation (Addendum)
Anesthesia Evaluation  Patient identified by MRN, date of birth, ID band Patient awake    Reviewed: Allergy & Precautions, NPO status , Patient's Chart, lab work & pertinent test results  Airway Mallampati: I       Dental no notable dental hx.    Pulmonary sleep apnea , former smoker   Pulmonary exam normal        Cardiovascular hypertension, Normal cardiovascular exam     Neuro/Psych  Headaches PSYCHIATRIC DISORDERS Anxiety Depression     Neuromuscular disease    GI/Hepatic Neg liver ROS,GERD  ,,  Endo/Other  Hypothyroidism    Renal/GU negative Renal ROS     Musculoskeletal  (+) Arthritis ,  Fibromyalgia -  Abdominal   Peds  Hematology  (+) Blood dyscrasia, anemia   Anesthesia Other Findings   Reproductive/Obstetrics                             Anesthesia Physical Anesthesia Plan  ASA: 2  Anesthesia Plan: Spinal   Post-op Pain Management: Regional block*   Induction: Intravenous  PONV Risk Score and Plan: 3 and Ondansetron, Dexamethasone, Propofol infusion and Midazolam  Airway Management Planned: Natural Airway and Nasal Cannula  Additional Equipment: None  Intra-op Plan:   Post-operative Plan:   Informed Consent: I have reviewed the patients History and Physical, chart, labs and discussed the procedure including the risks, benefits and alternatives for the proposed anesthesia with the patient or authorized representative who has indicated his/her understanding and acceptance.     Dental advisory given  Plan Discussed with: CRNA  Anesthesia Plan Comments: (See PAT note from 12/23 by Sherlie Ban PA-C  Lab Results      Component                Value               Date                      WBC                      9.7                 07/27/2023                HGB                      12.2                07/27/2023                HCT                      38.9                 07/27/2023                MCV                      83.8                07/27/2023                PLT                      364  07/27/2023           )        Anesthesia Quick Evaluation

## 2023-07-30 NOTE — Progress Notes (Signed)
DISCUSSION: Gloria Hall is a 52 yo female who presents to PAT prior to L TKA on 08/04/23 with Dr. Dion Saucier. PMH of former smoking, palpitations, GERD, migraines, hypothyroidism, fibromyalgia, anxiety, depression, arthritis.  Patient follows with Cardiology for palpitations. Holter monitoring in the past has showed episodes of SVT. Ischemic w/u has not shown any significant CAD or CHF.  She was last seen in clinic on 04/03/23 for pre op clearance. BP was noted to be low and she reported fatigue so Echo was obtained which was normal. She was cleared for surgery:  "Pre-Operative Clearance:    .According to the Revised Cardiac Risk Index (RCRI), her Perioperative Risk of Major Cardiac Event is (%): 0.4   Due to hypotension, significant fatigue, and dyspnea, I will hold preoperative clearance until echocardiogram as 1 has not been done since 2019.  If echocardiogram reveals normal LV systolic function she will be cleared for surgery.  Will await results and note will be sent to Dr. Dion Saucier with [facility] once it is been completed.   Addendum: Echocardiogram completed on 04/27/2023 revealed normal LVEF of 60 to 65%, diastolic parameters were normal.  Normal mitral, aortic valves.   Therefore, based on ACC/AHA guidelines, patient would be at acceptable risk for the planned procedure without further cardiovascular testing. I will route this recommendation to the requesting party via Epic fax function. "  VS: BP (!) 137/96   Pulse 88   Temp 36.9 C (Oral)   Resp 16   Ht 5' 3.25" (1.607 m)   Wt 102.1 kg   LMP 07/04/2014   SpO2 97%   BMI 39.54 kg/m   PROVIDERS: PCP: [facility] on [address] Cardiologist:  Parke Poisson, MD Rheumatology- DR Deveshwar   LABS: Labs reviewed: Acceptable for surgery. (all labs ordered are listed, but only abnormal results are displayed)  Labs Reviewed  SURGICAL PCR SCREEN  CBC  BASIC METABOLIC PANEL     IMAGES:   EKG:   CV:  Echo  04/27/23:   IMPRESSIONS    1. Left ventricular ejection fraction, by estimation, is 60 to 65%. The left ventricle has normal function. The left ventricle has no regional wall motion abnormalities. Left ventricular diastolic parameters were normal.  2. Right ventricular systolic function is normal. The right ventricular size is normal.  3. The mitral valve is normal in structure. Trivial mitral valve regurgitation. No evidence of mitral stenosis.  4. The aortic valve is tricuspid. Aortic valve regurgitation is not visualized. No aortic stenosis is present.  5. The inferior vena cava is normal in size with greater than 50% respiratory variability, suggesting right atrial pressure of 3 mmHg.  CT Coronary 04/26/22:   IMPRESSION: 1. Coronary calcium score of 0. This was 0 percentile for age and sex matched control.   2. Normal coronary origin with right dominance.   3. No evidence of CAD.   CAD-RADS 0. No evidence of CAD (0%). Consider non-atherosclerotic causes of chest pain.  Holter monitoring 07/15/2018:    IMPRESSION: rare supraventricular and ventricular ectopy. Brief, infrequent episodes of SVT (3 episodes, <5 bpm).  Past Medical History:  Diagnosis Date   Anxiety    Arthritis    Back pain, chronic    Bronchitis    Depression    Fibromyalgia    GERD (gastroesophageal reflux disease)    Heart palpitations    Hypothyroidism    IBS (irritable bowel syndrome)    LSIL (low grade squamous intraepithelial lesion) on Pap smear  08/21/2011   Migraines    Palpitations    Peripheral neuropathic pain    in hands   Sleep apnea    does not have cpap    Past Surgical History:  Procedure Laterality Date   COLONOSCOPY  04/17/2023   Dr. Dulce Sellar at [facility] AND CURETTAGE OF UTERUS     dilation and curretage     after a miscarriage   ENDOMETRIAL ABLATION     KNEE ARTHROSCOPY Left 12/01/2013   LAPAROSCOPY  03/19/2011   Procedure: LAPAROSCOPY OPERATIVE;  Surgeon:  Hollie Salk C. Marice Potter, MD;  Location: [facility];  Service: Gynecology;  Laterality: N/A;   NOVASURE ABLATION  03/19/2011   Procedure: NOVASURE ABLATION;  Surgeon: Hollie Salk C. Marice Potter, MD;  Location: [facility];  Service: Gynecology;  Laterality: N/A;   TUBAL LIGATION  1993    MEDICATIONS:  busPIRone (BUSPAR) 10 MG tablet   ezetimibe (ZETIA) 10 MG tablet   levothyroxine (SYNTHROID) 25 MCG tablet   meloxicam (MOBIC) 15 MG tablet   methocarbamol (ROBAXIN) 750 MG tablet   omeprazole (PRILOSEC) 20 MG capsule   ondansetron (ZOFRAN) 8 MG tablet   PARoxetine (PAXIL) 30 MG tablet   topiramate (TOPAMAX) 25 MG tablet   traMADol (ULTRAM) 50 MG tablet   No current facility-administered medications for this encounter.   Marcille Blanco [facility] Surgical Short Stay/Anesthesiology [facility] Phone [phone] 07/30/2023 9:53 AM

## 2023-08-04 ENCOUNTER — Encounter (HOSPITAL_COMMUNITY)
Admission: RE | Disposition: A | Discharge status: Home / Self Care | Payer: Self-pay | Source: Home / Self Care | Attending: Orthopedic Surgery

## 2023-08-04 ENCOUNTER — Other Ambulatory Visit: Payer: Self-pay

## 2023-08-04 ENCOUNTER — Ambulatory Visit (HOSPITAL_COMMUNITY)
Admission: RE | Admit: 2023-08-04 | Discharge: 2023-08-04 | Disposition: A | Discharge status: Home / Self Care | Payer: Medicaid Other | Attending: Orthopedic Surgery | Admitting: Orthopedic Surgery

## 2023-08-04 ENCOUNTER — Ambulatory Visit (HOSPITAL_COMMUNITY): Payer: Medicaid Other

## 2023-08-04 ENCOUNTER — Ambulatory Visit (HOSPITAL_COMMUNITY): Payer: Medicaid Other | Admitting: Medical

## 2023-08-04 ENCOUNTER — Encounter (HOSPITAL_COMMUNITY): Payer: Self-pay | Admitting: Orthopedic Surgery

## 2023-08-04 ENCOUNTER — Ambulatory Visit (HOSPITAL_COMMUNITY): Payer: Medicaid Other | Admitting: Anesthesiology

## 2023-08-04 DIAGNOSIS — Z79899 Other long term (current) drug therapy: Secondary | ICD-10-CM | POA: Diagnosis not present

## 2023-08-04 DIAGNOSIS — M797 Fibromyalgia: Secondary | ICD-10-CM | POA: Insufficient documentation

## 2023-08-04 DIAGNOSIS — Z87891 Personal history of nicotine dependence: Secondary | ICD-10-CM | POA: Diagnosis not present

## 2023-08-04 DIAGNOSIS — K219 Gastro-esophageal reflux disease without esophagitis: Secondary | ICD-10-CM | POA: Insufficient documentation

## 2023-08-04 DIAGNOSIS — F419 Anxiety disorder, unspecified: Secondary | ICD-10-CM | POA: Diagnosis not present

## 2023-08-04 DIAGNOSIS — I10 Essential (primary) hypertension: Secondary | ICD-10-CM | POA: Diagnosis not present

## 2023-08-04 DIAGNOSIS — M1712 Unilateral primary osteoarthritis, left knee: Secondary | ICD-10-CM | POA: Diagnosis present

## 2023-08-04 DIAGNOSIS — R519 Headache, unspecified: Secondary | ICD-10-CM | POA: Diagnosis not present

## 2023-08-04 DIAGNOSIS — E039 Hypothyroidism, unspecified: Secondary | ICD-10-CM | POA: Diagnosis not present

## 2023-08-04 DIAGNOSIS — Z01818 Encounter for other preprocedural examination: Secondary | ICD-10-CM

## 2023-08-04 DIAGNOSIS — G4730 Sleep apnea, unspecified: Secondary | ICD-10-CM | POA: Diagnosis not present

## 2023-08-04 DIAGNOSIS — F32A Depression, unspecified: Secondary | ICD-10-CM | POA: Insufficient documentation

## 2023-08-04 HISTORY — PX: TOTAL KNEE ARTHROPLASTY: SHX125

## 2023-08-04 HISTORY — DX: Unilateral primary osteoarthritis, left knee: M17.12

## 2023-08-04 SURGERY — ARTHROPLASTY, KNEE, TOTAL
Anesthesia: Spinal | Site: Knee | Laterality: Left

## 2023-08-04 MED ORDER — ACETAMINOPHEN 325 MG PO TABS: 325.0000 mg | ORAL_TABLET | Freq: Once | ORAL | Status: DC | PRN

## 2023-08-04 MED ORDER — LACTATED RINGERS IV SOLN: INTRAVENOUS | Status: DC | PRN

## 2023-08-04 MED ORDER — TRANEXAMIC ACID-NACL 1000-0.7 MG/100ML-% IV SOLN
1000.0000 mg | Freq: Once | INTRAVENOUS | Status: DC
Start: 2023-08-04 — End: 2023-08-04

## 2023-08-04 MED ORDER — KETOROLAC TROMETHAMINE 30 MG/ML IJ SOLN
INTRAMUSCULAR | Status: AC
Start: 2023-08-04 — End: ?
  Filled 2023-08-04: qty 1

## 2023-08-04 MED ORDER — SODIUM CHLORIDE 0.9% FLUSH: 3.0000 mL | INTRAVENOUS | Status: DC | PRN

## 2023-08-04 MED ORDER — ACETAMINOPHEN 10 MG/ML IV SOLN: 1000.0000 mg | Freq: Once | INTRAVENOUS | Status: DC | PRN

## 2023-08-04 MED ORDER — ONDANSETRON HCL 4 MG/2ML IJ SOLN
INTRAMUSCULAR | Status: AC
  Filled 2023-08-04: qty 2

## 2023-08-04 MED ORDER — SODIUM CHLORIDE 0.9 % IR SOLN
Status: DC | PRN
  Administered 2023-08-04 (×2): 1000 mL

## 2023-08-04 MED ORDER — KETOROLAC TROMETHAMINE 30 MG/ML IJ SOLN
INTRAMUSCULAR | Status: DC | PRN
  Administered 2023-08-04: 30 mg

## 2023-08-04 MED ORDER — CEFAZOLIN SODIUM-DEXTROSE 2-4 GM/100ML-% IV SOLN
2.0000 g | INTRAVENOUS | Status: AC
  Administered 2023-08-04: 2 g via INTRAVENOUS
  Filled 2023-08-04: qty 100

## 2023-08-04 MED ORDER — OXYCODONE HCL 5 MG PO TABS: 5.0000 mg | ORAL_TABLET | ORAL | 0 refills | Status: DC | PRN

## 2023-08-04 MED ORDER — BUPIVACAINE IN DEXTROSE 0.75-8.25 % IT SOLN
INTRATHECAL | Status: DC | PRN
  Administered 2023-08-04: 1.8 mL via INTRATHECAL

## 2023-08-04 MED ORDER — HYDROMORPHONE HCL 1 MG/ML IJ SOLN
INTRAMUSCULAR | Status: AC
  Filled 2023-08-04: qty 1

## 2023-08-04 MED ORDER — SODIUM CHLORIDE 0.9% FLUSH: 3.0000 mL | Freq: Two times a day (BID) | INTRAVENOUS | Status: DC

## 2023-08-04 MED ORDER — ROPIVACAINE HCL 5 MG/ML IJ SOLN
INTRAMUSCULAR | Status: DC | PRN
  Administered 2023-08-04: 30 mL via PERINEURAL

## 2023-08-04 MED ORDER — PROPOFOL 500 MG/50ML IV EMUL: INTRAVENOUS | Status: DC | PRN

## 2023-08-04 MED ORDER — TRANEXAMIC ACID-NACL 1000-0.7 MG/100ML-% IV SOLN
1000.0000 mg | INTRAVENOUS | Status: AC
Start: 2023-08-04 — End: 2023-08-04
  Administered 2023-08-04: 1000 mg via INTRAVENOUS
  Filled 2023-08-04: qty 100

## 2023-08-04 MED ORDER — BUPIVACAINE-EPINEPHRINE 0.25% -1:200000 IJ SOLN
INTRAMUSCULAR | Status: AC
  Filled 2023-08-04: qty 1

## 2023-08-04 MED ORDER — LACTATED RINGERS IV SOLN
INTRAVENOUS | Status: AC
Start: 2023-08-04 — End: 2023-08-04

## 2023-08-04 MED ORDER — HYDROMORPHONE HCL 1 MG/ML IJ SOLN
0.2500 mg | INTRAMUSCULAR | Status: DC | PRN
  Administered 2023-08-04 (×2): 0.5 mg via INTRAVENOUS

## 2023-08-04 MED ORDER — METHOCARBAMOL 750 MG PO TABS: 750.0000 mg | ORAL_TABLET | Freq: Three times a day (TID) | ORAL | 1 refills | Status: DC

## 2023-08-04 MED ORDER — ACETAMINOPHEN 500 MG PO TABS
1000.0000 mg | ORAL_TABLET | Freq: Once | ORAL | Status: AC
  Administered 2023-08-04: 1000 mg via ORAL
  Filled 2023-08-04: qty 2

## 2023-08-04 MED ORDER — DEXAMETHASONE SODIUM PHOSPHATE 10 MG/ML IJ SOLN
INTRAMUSCULAR | Status: DC | PRN
  Administered 2023-08-04: 8 mg via INTRAVENOUS

## 2023-08-04 MED ORDER — PHENYLEPHRINE HCL-NACL 20-0.9 MG/250ML-% IV SOLN
INTRAVENOUS | Status: DC | PRN
  Administered 2023-08-04: 50 ug/min via INTRAVENOUS

## 2023-08-04 MED ORDER — DEXAMETHASONE SODIUM PHOSPHATE 10 MG/ML IJ SOLN
INTRAMUSCULAR | Status: AC
  Filled 2023-08-04: qty 1

## 2023-08-04 MED ORDER — SENNA-DOCUSATE SODIUM 8.6-50 MG PO TABS: 2.0000 | ORAL_TABLET | Freq: Every day | ORAL | 1 refills | Status: DC

## 2023-08-04 MED ORDER — ORAL CARE MOUTH RINSE: 15.0000 mL | Freq: Once | OROMUCOSAL | Status: AC

## 2023-08-04 MED ORDER — ONDANSETRON HCL 4 MG/2ML IJ SOLN
INTRAMUSCULAR | Status: DC | PRN
  Administered 2023-08-04: 4 mg via INTRAVENOUS

## 2023-08-04 MED ORDER — MEPERIDINE HCL 50 MG/ML IJ SOLN: 6.2500 mg | INTRAMUSCULAR | Status: DC | PRN

## 2023-08-04 MED ORDER — LACTATED RINGERS IV SOLN: INTRAVENOUS | Status: DC

## 2023-08-04 MED ORDER — ACETAMINOPHEN 160 MG/5ML PO SOLN: 325.0000 mg | Freq: Once | ORAL | Status: DC | PRN

## 2023-08-04 MED ORDER — CHLORHEXIDINE GLUCONATE 0.12 % MT SOLN
15.0000 mL | Freq: Once | OROMUCOSAL | Status: AC
  Administered 2023-08-04: 15 mL via OROMUCOSAL

## 2023-08-04 MED ORDER — FENTANYL CITRATE PF 50 MCG/ML IJ SOSY
100.0000 ug | PREFILLED_SYRINGE | INTRAMUSCULAR | Status: AC
Start: 2023-08-04 — End: 2023-08-04
  Administered 2023-08-04: 50 ug via INTRAVENOUS
  Filled 2023-08-04: qty 2

## 2023-08-04 MED ORDER — PROPOFOL 1000 MG/100ML IV EMUL
INTRAVENOUS | Status: AC
  Filled 2023-08-04: qty 100

## 2023-08-04 MED ORDER — DROPERIDOL 2.5 MG/ML IJ SOLN: 0.6250 mg | Freq: Once | INTRAMUSCULAR | Status: DC | PRN

## 2023-08-04 MED ORDER — PROPOFOL 10 MG/ML IV BOLUS
INTRAVENOUS | Status: DC | PRN
  Administered 2023-08-04: 100 ug/kg/min via INTRAVENOUS

## 2023-08-04 MED ORDER — PROPOFOL 1000 MG/100ML IV EMUL
INTRAVENOUS | Status: AC
Start: 2023-08-04 — End: ?
  Filled 2023-08-04: qty 100

## 2023-08-04 MED ORDER — ONDANSETRON HCL 8 MG PO TABS: 8.0000 mg | ORAL_TABLET | Freq: Three times a day (TID) | ORAL | 0 refills | Status: AC | PRN

## 2023-08-04 MED ORDER — SODIUM CHLORIDE 0.9% FLUSH
3.0000 mL | Freq: Two times a day (BID) | INTRAVENOUS | Status: DC
Start: 2023-08-04 — End: 2023-08-04

## 2023-08-04 MED ORDER — MIDAZOLAM HCL 2 MG/2ML IJ SOLN
2.0000 mg | INTRAMUSCULAR | Status: AC
  Administered 2023-08-04: 2 mg via INTRAVENOUS
  Filled 2023-08-04: qty 2

## 2023-08-04 MED ORDER — PROPOFOL 500 MG/50ML IV EMUL
INTRAVENOUS | Status: DC | PRN
  Administered 2023-08-04: 20 mg via INTRAVENOUS
  Administered 2023-08-04: 30 mg via INTRAVENOUS
  Administered 2023-08-04: 20 mg via INTRAVENOUS

## 2023-08-04 MED ORDER — WATER FOR IRRIGATION, STERILE IR SOLN
Status: DC | PRN
  Administered 2023-08-04: 1000 mL

## 2023-08-04 MED ORDER — ASPIRIN 325 MG PO TBEC: 325.0000 mg | DELAYED_RELEASE_TABLET | Freq: Two times a day (BID) | ORAL | 0 refills | Status: DC

## 2023-08-04 MED ORDER — POVIDONE-IODINE 10 % EX SWAB: 2.0000 | Freq: Once | CUTANEOUS | Status: DC

## 2023-08-04 SURGICAL SUPPLY — 49 items
ATTUNE PS FEM LT SZ 4 CEM KNEE (Femur) IMPLANT
BAG COUNTER SPONGE SURGICOUNT (BAG) IMPLANT
BAG ZIPLOCK 12X15 (MISCELLANEOUS) IMPLANT
BASEPLATE TIB CMT FB PCKT SZ4 (Stem) IMPLANT
BLADE SAG 18X100X1.27 (BLADE) ×1 IMPLANT
BLADE SAW SGTL 11.0X1.19X90.0M (BLADE) IMPLANT
BLADE SAW SGTL 13X75X1.27 (BLADE) ×1 IMPLANT
BLADE SURG 15 STRL LF DISP TIS (BLADE) ×1 IMPLANT
BNDG ELASTIC 6X10 VLCR STRL LF (GAUZE/BANDAGES/DRESSINGS) ×1 IMPLANT
BNDG ELASTIC 6X15 VLCR STRL LF (GAUZE/BANDAGES/DRESSINGS) IMPLANT
BOWL SMART MIX CTS (DISPOSABLE) ×1 IMPLANT
CEMENT HV SMART SET (Cement) ×2 IMPLANT
CLSR STERI-STRIP ANTIMIC 1/2X4 (GAUZE/BANDAGES/DRESSINGS) ×2 IMPLANT
COVER SURGICAL LIGHT HANDLE (MISCELLANEOUS) ×1 IMPLANT
CUFF TRNQT CYL 34X4.125X (TOURNIQUET CUFF) ×1 IMPLANT
DRAPE SHEET LG 3/4 BI-LAMINATE (DRAPES) ×1 IMPLANT
DRAPE U-SHAPE 47X51 STRL (DRAPES) ×1 IMPLANT
DRSG MEPILEX POST OP 4X12 (GAUZE/BANDAGES/DRESSINGS) ×1 IMPLANT
DURAPREP 26ML APPLICATOR (WOUND CARE) ×2 IMPLANT
ELECT REM PT RETURN 15FT ADLT (MISCELLANEOUS) ×1 IMPLANT
GAUZE PAD ABD 8X10 STRL (GAUZE/BANDAGES/DRESSINGS) ×2 IMPLANT
GLOVE BIO SURGEON STRL SZ 6.5 (GLOVE) ×1 IMPLANT
GLOVE BIO SURGEON STRL SZ7.5 (GLOVE) ×1 IMPLANT
GLOVE BIOGEL PI IND STRL 7.0 (GLOVE) ×1 IMPLANT
GLOVE BIOGEL PI IND STRL 8 (GLOVE) ×1 IMPLANT
GOWN STRL SURGICAL XL XLNG (GOWN DISPOSABLE) ×2 IMPLANT
HOLDER FOLEY CATH W/STRAP (MISCELLANEOUS) IMPLANT
IMMOBILIZER KNEE 20 (SOFTGOODS) ×1 IMPLANT
IMMOBILIZER KNEE 20 THIGH 36 (SOFTGOODS) ×1 IMPLANT
INSERT TIB ATTUNE FB SZ4X8 (Insert) IMPLANT
KIT TURNOVER KIT A (KITS) IMPLANT
MANIFOLD NEPTUNE II (INSTRUMENTS) ×1 IMPLANT
NS IRRIG 1000ML POUR BTL (IV SOLUTION) ×1 IMPLANT
PACK TOTAL KNEE CUSTOM (KITS) ×1 IMPLANT
PATELLA MEDIAL ATTUN 35MM KNEE (Knees) IMPLANT
PIN STEINMAN FIXATION KNEE (PIN) IMPLANT
PROTECTOR NERVE ULNAR (MISCELLANEOUS) ×1 IMPLANT
SET HNDPC FAN SPRY TIP SCT (DISPOSABLE) ×1 IMPLANT
SET PAD KNEE POSITIONER (MISCELLANEOUS) ×1 IMPLANT
SPIKE FLUID TRANSFER (MISCELLANEOUS) IMPLANT
STRIP CLOSURE SKIN 1/2X4 (GAUZE/BANDAGES/DRESSINGS) IMPLANT
SUT STRATAFIX PDS+ 0 24IN (SUTURE) IMPLANT
SUT VIC AB 1 CT1 36 (SUTURE) ×2 IMPLANT
SUT VIC AB 2-0 CT1 TAPERPNT 27 (SUTURE) ×1 IMPLANT
SUT VIC AB 3-0 SH 27X BRD (SUTURE) IMPLANT
TRAY FOLEY MTR SLVR 16FR STAT (SET/KITS/TRAYS/PACK) ×1 IMPLANT
TUBE SUCTION HIGH CAP CLEAR NV (SUCTIONS) ×1 IMPLANT
WATER STERILE IRR 1000ML POUR (IV SOLUTION) ×2 IMPLANT
WRAP KNEE MAXI GEL POST OP (GAUZE/BANDAGES/DRESSINGS) ×1 IMPLANT

## 2023-08-04 NOTE — H&P (Signed)
 PREOPERATIVE H&P  Chief Complaint: Left knee pain  HPI: Gloria Hall is a 52 y.o. female who presents for preoperative history and physical with a diagnosis of left knee osteoarthritis. Symptoms are rated as moderate to severe, and have been worsening.  This is significantly impairing activities of daily living.  She has elected for surgical management.  She has failed injections activity modification and has worked on weight loss to get her BMI below 40.  Past Medical History:  Diagnosis Date   Anxiety    Arthritis    Back pain, chronic    Bronchitis    Depression    Fibromyalgia    GERD (gastroesophageal reflux disease)    Heart palpitations    Hypothyroidism    IBS (irritable bowel syndrome)    LSIL (low grade squamous intraepithelial lesion) on Pap smear 08/21/2011   Migraines    Palpitations    Peripheral neuropathic pain    in hands   Sleep apnea    does not have cpap   Past Surgical History:  Procedure Laterality Date   COLONOSCOPY  04/17/2023   Dr. Burnette at [facility] AND CURETTAGE OF UTERUS     dilation and curretage     after a miscarriage   ENDOMETRIAL ABLATION     KNEE ARTHROSCOPY Left 12/01/2013   LAPAROSCOPY  03/19/2011   Procedure: LAPAROSCOPY OPERATIVE;  Surgeon: Harland C. Starla, MD;  Location: [facility];  Service: Gynecology;  Laterality: N/A;   NOVASURE ABLATION  03/19/2011   Procedure: NOVASURE ABLATION;  Surgeon: Harland C. Starla, MD;  Location: [facility];  Service: Gynecology;  Laterality: N/A;   TUBAL LIGATION  1993   Social History   Socioeconomic History   Marital status: Widowed    Spouse name: Lysbeth Dicola   Number of children: 3   Years of education: 12th   Highest education level: Not on file  Occupational History    Employer: [employer]    Comment: [employer]  Tobacco Use   Smoking status: Former    Current packs/day: 0.00    Average packs/day: 1.5 packs/day for 10.0 years (15.0 ttl  pk-yrs)    Types: Cigarettes    Start date: 05/04/1999    Quit date: 05/03/2009    Years since quitting: 14.2    Passive exposure: Current (minimal)   Smokeless tobacco: Never  Vaping Use   Vaping status: Never Used  Substance and Sexual Activity   Alcohol use: No    Comment: quit in 2008   Drug use: Not Currently    Comment: quit cocaine 17 years ago   Sexual activity: Yes    Birth control/protection: Surgical  Other Topics Concern   Not on file  Social History Narrative   Patient lives at home with spouse.   Caffeine Use: 2 cups of coffee and 2 cups of tea daily   Social Drivers of Health   Financial Resource Strain: Medium Risk (11/03/2022)   Received from [facility], [facility]   Overall Financial Resource Strain (CARDIA)    Difficulty of Paying Living Expenses: Somewhat hard  Food Insecurity: No Food Insecurity (11/03/2022)   Received from [facility], [facility]   Hunger Vital Sign    Worried About Running Out of Food in the Last Year: Never true    Ran Out of Food in the Last Year: Never true  Transportation Needs: No Transportation Needs (11/03/2022)   Received from [facility], [facility]  PRAPARE - Administrator, Civil Service (Medical): No    Lack of Transportation (Non-Medical): No  Physical Activity: Unknown (11/03/2022)   [facility]   Exercise Vital Sign    Days of Exercise per Week: 0 days    Minutes of Exercise per Session: Not on file  Stress: Stress Concern Present (11/03/2022)   Received from [facility], [facility] - Occupational Stress Questionnaire    Feeling of Stress : Rather much  Social Connections: Moderately Integrated (11/03/2022)   Received from [facility], [facility]   Social Network    How would you rate your social network (family, work, friends)?: Adequate participation with social networks   Family History  Problem Relation Age of  Onset   Arthritis Mother    Diabetes Mother        38   Hypertension Mother    Hyperlipidemia Mother    Fibromyalgia Mother    Sleep apnea Mother    Migraines Mother    Heart attack Father        x7   Heart Problems Father    Diabetes Father    Heart Problems Sister    Hypertension Sister    Arthritis Sister    Diabetes Maternal Aunt    Cancer Maternal Aunt        breast, metastatic cancer throughout body   Breast cancer Maternal Aunt    Crohn's disease Maternal Aunt    Cancer Maternal Uncle        leukemia   Cancer Maternal Uncle        colon   Diabetes Maternal Grandmother    Heart attack Maternal Grandfather 55   Heart attack Paternal Grandfather    Healthy Son    Healthy Daughter    Healthy Daughter    Allergies  Allergen Reactions   Imitrex  [Sumatriptan ] Other (See Comments)    Throat tightness x 15 minutes; no swelling or itching   Prior to Admission medications   Medication Sig Start Date End Date Taking? Authorizing Provider  busPIRone (BUSPAR) 10 MG tablet Take 10 mg by mouth 2 (two) times daily. 03/30/23  Yes [provider]  ezetimibe  (ZETIA ) 10 MG tablet Take 1 tablet (10 mg total) by mouth daily. 04/30/23  Yes Acharya, Gayatri A, MD  levothyroxine (SYNTHROID) 25 MCG tablet Take 25 mcg by mouth daily before breakfast.   Yes [provider]  meloxicam  (MOBIC ) 15 MG tablet Take 15 mg by mouth daily. 03/16/23  Yes [provider]  methocarbamol  (ROBAXIN ) 750 MG tablet Take 750 mg by mouth 3 (three) times daily.   Yes [provider]  ondansetron  (ZOFRAN ) 8 MG tablet Take 8 mg by mouth every 8 (eight) hours as needed for nausea or vomiting.   Yes [provider]  PARoxetine (PAXIL) 30 MG tablet Take 30 mg by mouth daily. 03/20/21  Yes [provider]  topiramate  (TOPAMAX ) 25 MG tablet Take 25 mg by mouth daily.   Yes [provider]  traMADol (ULTRAM) 50 MG tablet Take 50 mg by mouth 3 (three) times  daily as needed. 07/23/23  Yes [provider]  omeprazole  (PRILOSEC) 20 MG capsule Take 1 capsule (20 mg total) by mouth daily. Patient not taking: Reported on 07/27/2023 04/14/14   Oleta Berwyn LABOR, NP     Positive ROS: All other systems have been reviewed and were otherwise negative with the exception of those  mentioned in the HPI and as above.  Physical Exam: General: Alert, no acute distress Cardiovascular: No pedal edema Respiratory: No cyanosis, no use of accessory musculature GI: No organomegaly, abdomen is soft and non-tender Skin: No lesions in the area of chief complaint Neurologic: Sensation intact distally Psychiatric: Patient is competent for consent with normal mood and affect Lymphatic: No axillary or cervical lymphadenopathy  MUSCULOSKELETAL: 10-95 degrees of motion left knee with crepitance and pain  Assessment: Left knee primary localized osteoarthritis   Plan: Plan for Procedure(s): TOTAL KNEE ARTHROPLASTY  The risks benefits and alternatives were discussed with the patient including but not limited to the risks of nonoperative treatment, versus surgical intervention including infection, bleeding, nerve injury,  blood clots, cardiopulmonary complications, morbidity, mortality, among others, and they were willing to proceed.    Patient's anticipated LOS is less than 2 midnights, meeting these requirements: - Younger than 76 - Lives within 1 hour of care - Has a competent adult at home to recover with post-op recover - NO history of  - Chronic pain requiring opiods  - Diabetes  - Coronary Artery Disease  - Heart failure  - Heart attack  - Stroke  - DVT/VTE  - Cardiac arrhythmia  - Respiratory Failure/COPD  - Renal failure  - Anemia  - Advanced Liver disease      Fonda SHAUNNA Olmsted, MD Cell [phone]   08/04/2023 9:39 AM

## 2023-08-04 NOTE — Evaluation (Signed)
 Physical Therapy Evaluation Patient Details Name: Gloria Hall MRN: [id number] DOB: 06/25/1971 Today's Date: 08/04/2023  History of Present Illness  52 yo female presents to therapy s/p L TKA on 08/04/2023 due to failure of conservative measures. Pt PMH includes but is not limited to: GAD, fibromyalgia, GERD, IBS, B UE neuropathic pain, OSA, and chronic pain.  Clinical Impression      Gloria Hall is a 52 y.o. female POD 0 s/p L TKA. Patient reports mod I with mobility at baseline. Patient is now limited by functional impairments (see PT problem list below) and requires CGA and cues for transfers and gait with RW. Patient was able to ambulate 15 and 40 feet with RW and CGA and cues for safe walker management. Patient educated on safe sequencing for stair mobility with use of RW, fall risk prevention, pain management and goal, use of RW, use of CP/ice and car transfers pt and son verbalized understanding of safe guarding position for people assisting with mobility. Patient instructed in exercises to facilitate ROM and circulation reviewed and HO provided. Patient will benefit from continued skilled PT interventions to address impairments and progress towards PLOF. Patient has met mobility goals at adequate level for discharge home with family support and OPPT services; will continue to follow if pt continues acute stay to progress towards Mod I goals.     If plan is discharge home, recommend the following:     Can travel by private vehicle        Equipment Recommendations Rolling walker (2 wheels)  Recommendations for Other Services       Functional Status Assessment Patient has had a recent decline in their functional status and demonstrates the ability to make significant improvements in function in a reasonable and predictable amount of time.     Precautions / Restrictions Precautions Precautions: Knee;Fall Restrictions Weight Bearing Restrictions Per Provider Order: No       Mobility  Bed Mobility Overal bed mobility: Needs Assistance Bed Mobility: Supine to Sit     Supine to sit: Contact guard, HOB elevated     General bed mobility comments: min cues    Transfers Overall transfer level: Needs assistance Equipment used: Rolling walker (2 wheels) Transfers: Sit to/from Stand Sit to Stand: Contact guard assist           General transfer comment: min cues    Ambulation/Gait Ambulation/Gait assistance: Contact guard assist Gait Distance (Feet): 40 Feet Assistive device: Rolling walker (2 wheels) Gait Pattern/deviations: Step-to pattern, Antalgic, Trunk flexed Gait velocity: decreased     General Gait Details: use of B UE support at RW for offloading L LE instace phase, first amb bout pt reported feeling a little unstable and dizzy ~ 15 feet and pt guided to recliner, pt indicated sensation subsided once seated, PT assessed pt response and BP-- Bp seated 105/73 and in standing 112/72 and pt very motivated to continue therapy session with no reports of instabiltiy or light headedness with subsequent amb bout with step almost through pattern and reporting improved B LE/foot sensation and proprioception  Stairs Stairs: Yes Stairs assistance: Contact guard assist Stair Management: Two rails Number of Stairs: 2 General stair comments: pt and son instructed on use of RW to navigate one step with proper sequencing and AD placement and vebalized understanding pt able to perform step navigation with CGA and cues with B handrail  Wheelchair Mobility     Tilt Bed    Modified Rankin (Stroke Patients Only)  Balance Overall balance assessment: Needs assistance, History of Falls (1 fall past 6 months) Sitting-balance support: Bilateral upper extremity supported Sitting balance-Leahy Scale: Good     Standing balance support: Bilateral upper extremity supported, During functional activity, Reliant on assistive device for balance Standing  balance-Leahy Scale: Poor                               Pertinent Vitals/Pain Pain Assessment Pain Assessment: 0-10 Pain Score: 0-No pain Pain Location: L Knee Pain Intervention(s): Limited activity within patient's tolerance, Monitored during session, Premedicated before session, Repositioned, Ice applied    Home Living Family/patient expects to be discharged to:: Private residence Living Arrangements: Children (grandchildren 2,4 and 2 9 yos) Available Help at Discharge: Family (son willl be with pt for one day) Type of Home: House Home Access: Stairs to enter Entrance Stairs-Rails: None Entrance Stairs-Number of Steps: 1 (threshold)   Home Layout: One level Home Equipment: Rollator (4 wheels);Cane - single point Additional Comments: walking stick    Prior Function Prior Level of Function : Independent/Modified Independent;Driving;Working/employed             Mobility Comments: occational use of SPC or walking stick sometimes both for ambulation pending pain, mod I for all ADLs, self care tasks and IADLs       Extremity/Trunk Assessment        Lower Extremity Assessment Lower Extremity Assessment: LLE deficits/detail LLE Deficits / Details: ankle DF/PF 5/5; SLR < 10 degree lag LLE Sensation: decreased light touch;decreased proprioception    Cervical / Trunk Assessment Cervical / Trunk Assessment: Normal  Communication   Communication Communication: No apparent difficulties  Cognition Arousal: Alert Behavior During Therapy: WFL for tasks assessed/performed Overall Cognitive Status: Within Functional Limits for tasks assessed                                          General Comments      Exercises Total Joint Exercises Ankle Circles/Pumps: AROM, Both, 10 reps Quad Sets: AROM, Left, 5 reps Short Arc Quad: AROM, Left, 5 reps Heel Slides: AROM, Left, 5 reps Hip ABduction/ADduction: AROM, Left, 5 reps Straight Leg Raises:  AROM, Left, 5 reps Knee Flexion: AROM, Left, 5 reps, Seated   Assessment/Plan    PT Assessment Patient needs continued PT services  PT Problem List Decreased strength;Decreased range of motion;Decreased activity tolerance;Decreased balance;Decreased mobility;Decreased coordination;Pain       PT Treatment Interventions DME instruction;Gait training;Stair training;Functional mobility training;Therapeutic activities;Balance training;Therapeutic exercise;Neuromuscular re-education;Patient/family education;Modalities    PT Goals (Current goals can be found in the Care Plan section)  Acute Rehab PT Goals Patient Stated Goal: to be able to get the R knee done PT Goal Formulation: With patient Time For Goal Achievement: 08/18/23 Potential to Achieve Goals: Good    Frequency 7X/week     Co-evaluation               AM-PAC PT 6 Clicks Mobility  Outcome Measure Help needed turning from your back to your side while in a flat bed without using bedrails?: None Help needed moving from lying on your back to sitting on the side of a flat bed without using bedrails?: A Little Help needed moving to and from a bed to a chair (including a wheelchair)?: A Little Help needed standing up from a chair using  your arms (e.g., wheelchair or bedside chair)?: A Little Help needed to walk in hospital room?: A Little Help needed climbing 3-5 steps with a railing? : A Little 6 Click Score: 19    End of Session Equipment Utilized During Treatment: Gait belt Activity Tolerance: Patient tolerated treatment well;No increased pain Patient left: in chair;with call bell/phone within reach;with family/visitor present Nurse Communication: Mobility status;Other (comment) (pt readiness for d/c from PT standpoint) PT Visit Diagnosis: Unsteadiness on feet (R26.81);Other abnormalities of gait and mobility (R26.89);Muscle weakness (generalized) (M62.81);History of falling (Z91.81);Difficulty in walking, not elsewhere  classified (R26.2);Pain Pain - Right/Left: Left Pain - part of body: Knee    Time: 1612-1709 PT Time Calculation (min) (ACUTE ONLY): 57 min   Charges:   PT Evaluation $PT Eval Low Complexity: 1 Low PT Treatments $Gait Training: 8-22 mins $Therapeutic Exercise: 8-22 mins $Therapeutic Activity: 8-22 mins PT General Charges $$ ACUTE PT VISIT: 1 Visit         [place], PT Acute Rehab   [place] VEAR Drone 08/04/2023, 5:22 PM

## 2023-08-04 NOTE — Anesthesia Postprocedure Evaluation (Signed)
 Anesthesia Post Note  Patient: Gloria Hall  Procedure(s) Performed: TOTAL KNEE ARTHROPLASTY (Left: Knee)     Patient location during evaluation: PACU Anesthesia Type: Spinal Level of consciousness: oriented and awake and alert Pain management: pain level controlled Vital Signs Assessment: post-procedure vital signs reviewed and stable Respiratory status: spontaneous breathing, respiratory function stable and patient connected to nasal cannula oxygen Cardiovascular status: blood pressure returned to baseline and stable Postop Assessment: no headache, no backache and no apparent nausea or vomiting Anesthetic complications: no  No notable events documented.  Last Vitals:  Vitals:   08/04/23 1514 08/04/23 1515  BP: 128/83 112/74  Pulse: 85 82  Resp: 20   Temp: 36.6 C   SpO2: 96% 97%    Last Pain:  Vitals:   08/04/23 1514  TempSrc: Oral  PainSc: 0-No pain                 Franky JONETTA Bald

## 2023-08-04 NOTE — Anesthesia Procedure Notes (Signed)
 Spinal  Start time: 08/04/2023 11:02 AM End time: 08/04/2023 11:05 AM Reason for block: surgical anesthesia Staffing Performed: anesthesiologist  Anesthesiologist: Tilford Franky BIRCH, MD Performed by: Tilford Franky BIRCH, MD Authorized by: Tilford Franky BIRCH, MD   Preanesthetic Checklist Completed: patient identified, IV checked, site marked, risks and benefits discussed, surgical consent, monitors and equipment checked, pre-op evaluation and timeout performed Spinal Block Patient position: sitting Prep: DuraPrep and site prepped and draped Location: L3-4 Injection technique: single-shot Needle Needle type: Pencan  Needle gauge: 24 G Needle length: 10 cm Needle insertion depth: 10 cm Additional Notes Patient tolerated well. No immediate complications.  Functioning IV was confirmed and monitors were applied. Sterile prep and drape, including hand hygiene and sterile gloves were used. The patient was positioned and the back was prepped. The skin was anesthetized with lidocaine . Free flow of clear CSF was obtained prior to injecting local anesthetic into the CSF. The spinal needle aspirated freely following injection. The needle was carefully withdrawn. The patient tolerated the procedure well.

## 2023-08-04 NOTE — Op Note (Signed)
 DATE OF SURGERY:  08/04/2023 TIME: 12:51 PM  PATIENT NAME:  Gloria Hall   AGE: 52 y.o.    PRE-OPERATIVE DIAGNOSIS: Left knee primary localized osteoarthritis  POST-OPERATIVE DIAGNOSIS:  Same  PROCEDURE: Left total Knee Arthroplasty  SURGEON:  Fonda SHAUNNA Olmsted, MD   ASSISTANT: Slater Rummer, PA-C, present and scrubbed throughout the case, critical for assistance with exposure, retraction, instrumentation, and closure.   OPERATIVE IMPLANTS: Depuy Attune size 4 posterior Stabilized Femur, with a size 4 fixed Bearing Tibia, 8 polyethylene insert with a 35 medialized oval dome polyethylene patella.  PREOPERATIVE INDICATIONS:  Gloria Hall is a 52 y.o. year old female with end stage bone on bone degenerative arthritis of the knee who failed conservative treatment, including injections, antiinflammatories, activity modification, and assistive devices, and had significant impairment of their activities of daily living, and elected for Total Knee Arthroplasty.   The risks, benefits, and alternatives were discussed at length including but not limited to the risks of infection, bleeding, nerve injury, stiffness, blood clots, the need for revision surgery, cardiopulmonary complications, among others, and they were willing to proceed.  OPERATIVE FINDINGS AND UNIQUE ASPECTS OF THE CASE: She had advanced arthrosis on the medial side, as well as the lateral femoral condyle.  Moderate changes on the patella.  Bone quality was overall fairly poor.  I cut the femur on 9, and the tibia on 3 set off the medial side.  I debated whether or not she had some slight instability in flexion, although at the completion of the case it felt stable and appropriate with capsular closure.  She did not notch with the size 4, and I do not think she was a size 5.  The patella tracked with a no thumb technique at the completion of the case.  ESTIMATED BLOOD LOSS: 150 mL  OPERATIVE DESCRIPTION:  The patient was brought to  the operative room and placed in a supine position.  Anesthesia was administered.  IV antibiotics were given.  The lower extremity was prepped and draped in the usual sterile fashion.  Time out was performed.  The leg was elevated and exsanguinated and the tourniquet was inflated.  Anterior quadriceps tendon splitting approach was performed.  The patella was everted and osteophytes were removed.  The anterior horn of the medial and lateral meniscus was removed.   The patella was then measured, and cut with the saw.  The thickness before the cut was 21 and after the cut was 14.  A metal shield was used to protect the patella throughout the case.    The distal femur was opened with the drill and the intramedullary distal femoral cutting jig was utilized, set at 5 degrees resecting 9 mm off the distal femur.  Care was taken to protect the collateral ligaments.  Then the extramedullary tibial cutting jig was utilized making the appropriate cut using the anterior tibial crest as a reference building in appropriate posterior slope.  Care was taken during the cut to protect the medial and collateral ligaments.  The proximal tibia was removed along with the posterior horns of the menisci.  The PCL was sacrificed.    The extensor gap was measured and found to have adequate resection, measuring to a size 8.    The distal femoral sizing jig was applied, taking care to avoid notching.  This was set at 3 degrees of external rotation.  Then the 4-in-1 cutting jig was applied and the anterior and posterior femur was cut, along with the  chamfer cuts.  All posterior osteophytes were removed.  The flexion gap was then measured and was symmetric with the extension gap.  I completed the distal femoral preparation using the appropriate jig to prepare the box.  The proximal tibia sized and prepared accordingly with the reamer and the punch, and then all components were trialed with the poly insert.  The knee was found to  have excellent balance and full motion.    The above named components were then cemented into place and all excess cement was removed.  The real polyethylene implant was placed.  After the cement had cured I released the tourniquet and confirmed excellent hemostasis with no major posterior vessel injury.    The knee was easily taken through a range of motion and the patella tracked well and the knee irrigated copiously and the capsule was closed with strata fix, and right parapatellar and subcutaneous tissue closed with vicryl, and monocryl with steri strips for the skin.  The wounds were injected with marcaine , and dressed with sterile gauze and the patient was awakened and returned to the PACU in stable and satisfactory condition.  There were no complications.  Total tourniquet time was ~75 minutes.

## 2023-08-04 NOTE — Discharge Instructions (Signed)
 Diet: As you were doing prior to hospitalization   Shower:  May shower but keep the wounds dry, use an occlusive plastic wrap, NO SOAKING IN TUB.  If the bandage gets wet, change with a clean dry gauze.  If you have a splint on, leave the splint in place and keep the splint dry with a plastic bag.  Dressing:  You may change your dressing 3-5 days after surgery, unless you have a splint.  If you have a splint, then just leave the splint in place and we will change your bandages during your first follow-up appointment.    If you had hand or foot surgery, we will plan to remove your stitches in about 2 weeks in the office.  For all other surgeries, there are sticky tapes (steri-strips) on your wounds and all the stitches are absorbable.  Leave the steri-strips in place when changing your dressings, they will peel off with time, usually 2-3 weeks.  Activity:  Increase activity slowly as tolerated, but follow the weight bearing instructions below.  The rules on driving is that you can not be taking narcotics while you drive, and you must feel in control of the vehicle.    Weight Bearing:   as tolerated.    To prevent constipation: you may use a stool softener such as -  Colace (over the counter) 100 mg by mouth twice a day  Drink plenty of fluids (prune juice may be helpful) and high fiber foods Miralax (over the counter) for constipation as needed.    Itching:  If you experience itching with your medications, try taking only a single pain pill, or even half a pain pill at a time.  You may take up to 10 pain pills per day, and you can also use benadryl over the counter for itching or also to help with sleep.   Precautions:  If you experience chest pain or shortness of breath - call 911 immediately for transfer to the hospital emergency department!!  If you develop a fever greater that 101 F, purulent drainage from wound, increased redness or drainage from wound, or calf pain -- Call the office at  [phone]                                                Follow- Up Appointment:  Please call for an appointment to be seen in 2 weeks [place] - [phone]

## 2023-08-04 NOTE — Anesthesia Procedure Notes (Signed)
 Anesthesia Regional Block: Adductor canal block   Pre-Anesthetic Checklist: , timeout performed,  Correct Patient, Correct Site, Correct Laterality,  Correct Procedure, Correct Position, site marked,  Risks and benefits discussed,  Surgical consent,  Pre-op evaluation,  At surgeon's request and post-op pain management  Laterality: Left  Prep: chloraprep       Needles:  Injection technique: Single-shot  Needle Type: Echogenic Stimulator Needle     Needle Length: 9cm  Needle Gauge: 21     Additional Needles:   Procedures:,,,, ultrasound used (permanent image in chart),,    Narrative:  Start time: 08/04/2023 9:30 AM End time: 08/04/2023 9:35 AM Injection made incrementally with aspirations every 5 mL.  Performed by: Personally  Anesthesiologist: Tilford Franky BIRCH, MD  Additional Notes: Discussed risks and benefits of the nerve block in detail, including but not limited vascular injury, permanent nerve damage and infection.   Patient tolerated the procedure well. Local anesthetic introduced in an incremental fashion under minimal resistance after negative aspirations. No paresthesias were elicited. After completion of the procedure, no acute issues were identified and patient continued to be monitored by RN.

## 2023-08-04 NOTE — Transfer of Care (Signed)
 Immediate Anesthesia Transfer of Care Note  Patient: Alzina Golda  Procedure(s) Performed: TOTAL KNEE ARTHROPLASTY (Left: Knee)  Patient Location: PACU  Anesthesia Type:Spinal  Level of Consciousness: sedated  Airway & Oxygen Therapy: Patient Spontanous Breathing and Patient connected to face mask oxygen  Post-op Assessment: Report given to RN and Post -op Vital signs reviewed and stable  Post vital signs: Reviewed and stable  Last Vitals:  Vitals Value Taken Time  BP    Temp    Pulse 86 08/04/23 1346  Resp 14 08/04/23 1346  SpO2 95 % 08/04/23 1346  Vitals shown include unfiled device data.  Last Pain:  Vitals:   08/04/23 0935  TempSrc: Oral         Complications: No notable events documented.

## 2023-08-06 ENCOUNTER — Encounter (HOSPITAL_COMMUNITY): Payer: Self-pay | Admitting: Orthopedic Surgery

## 2023-08-14 ENCOUNTER — Encounter: Payer: 59 | Admitting: Physician Assistant

## 2023-08-14 ENCOUNTER — Encounter: Payer: 59 | Admitting: Rheumatology

## 2023-09-17 ENCOUNTER — Ambulatory Visit: Payer: 59 | Admitting: Rheumatology

## 2023-09-25 ENCOUNTER — Telehealth: Payer: Medicaid Other | Admitting: Family Medicine

## 2023-09-25 DIAGNOSIS — J029 Acute pharyngitis, unspecified: Secondary | ICD-10-CM | POA: Diagnosis not present

## 2023-09-25 DIAGNOSIS — R519 Headache, unspecified: Secondary | ICD-10-CM

## 2023-09-25 MED ORDER — AMOXICILLIN 875 MG PO TABS: 875.0000 mg | ORAL_TABLET | Freq: Two times a day (BID) | ORAL | 0 refills | Status: AC

## 2023-09-25 NOTE — Patient Instructions (Signed)

## 2023-09-25 NOTE — Progress Notes (Signed)
Virtual Visit Consent   Gloria Hall, you are scheduled for a virtual visit with a [facility] provider today. Just as with appointments in the office, your consent must be obtained to participate. Your consent will be active for this visit and any virtual visit you may have with one of our providers in the next 365 days. If you have a MyChart account, a copy of this consent can be sent to you electronically.  As this is a virtual visit, video technology does not allow for your provider to perform a traditional examination. This may limit your provider's ability to fully assess your condition. If your provider identifies any concerns that need to be evaluated in person or the need to arrange testing (such as labs, EKG, etc.), we will make arrangements to do so. Although advances in technology are sophisticated, we cannot ensure that it will always work on either your end or our end. If the connection with a video visit is poor, the visit may have to be switched to a telephone visit. With either a video or telephone visit, we are not always able to ensure that we have a secure connection.  By engaging in this virtual visit, you consent to the provision of healthcare and authorize for your insurance to be billed (if applicable) for the services provided during this visit. Depending on your insurance coverage, you may receive a charge related to this service.  I need to obtain your verbal consent now. Are you willing to proceed with your visit today? Kelli Robeck has provided verbal consent on 09/25/2023 for a virtual visit (video or telephone). Georgana Curio, FNP  Date: 09/25/2023 9:37 AM   Virtual Visit via Video Note   I, Georgana Curio, connected with  Gloria Hall  ([id number], 12-10-1970) on 09/25/23 at  9:30 AM EST by a video-enabled telemedicine application and verified that I am speaking with the correct person using two identifiers.  Location: Patient: Virtual Visit Location Patient:  Home Provider: Virtual Visit Location Provider: Home Office   I discussed the limitations of evaluation and management by telemedicine and the availability of in person appointments. The patient expressed understanding and agreed to proceed.    History of Present Illness: Gloria Hall is a 53 y.o. who identifies as a female who was assigned female at birth, and is being seen today for headache, nausea, sore throat unknown exposure, no vomiting. Marland Kitchen  HPI: HPI  Problems:  Patient Active Problem List   Diagnosis Date Noted   Primary localized osteoarthritis of left knee 08/04/2023   Palpitations    Hypertension    Anemia    Perimenopausal vasomotor symptoms 05/01/2016   DUB (dysfunctional uterine bleeding) 07/19/2014   Bilateral carpal tunnel syndrome 04/12/2014   Facial cellulitis 05/03/2012   Anxiety 05/03/2012   LSIL (low grade squamous intraepithelial lesion) on Pap smear 08/21/2011    Allergies:  Allergies  Allergen Reactions   Imitrex [Sumatriptan] Other (See Comments)    Throat tightness x 15 minutes; no swelling or itching   Medications:  Current Outpatient Medications:    aspirin EC 325 MG tablet, Take 1 tablet (325 mg total) by mouth 2 (two) times daily., Disp: 60 tablet, Rfl: 0   busPIRone (BUSPAR) 10 MG tablet, Take 10 mg by mouth 2 (two) times daily., Disp: , Rfl:    ezetimibe (ZETIA) 10 MG tablet, Take 1 tablet (10 mg total) by mouth daily., Disp: 90 tablet, Rfl: 3   levothyroxine (SYNTHROID) 25 MCG tablet, Take 25 mcg  by mouth daily before breakfast., Disp: , Rfl:    meloxicam (MOBIC) 15 MG tablet, Take 15 mg by mouth daily., Disp: , Rfl:    methocarbamol (ROBAXIN) 750 MG tablet, Take 1 tablet (750 mg total) by mouth 3 (three) times daily., Disp: 30 tablet, Rfl: 1   omeprazole (PRILOSEC) 20 MG capsule, Take 1 capsule (20 mg total) by mouth daily. (Patient not taking: Reported on 07/27/2023), Disp: 90 capsule, Rfl: 3   ondansetron (ZOFRAN) 8 MG tablet, Take 1 tablet (8  mg total) by mouth every 8 (eight) hours as needed for nausea or vomiting., Disp: 20 tablet, Rfl: 0   oxyCODONE (ROXICODONE) 5 MG immediate release tablet, Take 1 tablet (5 mg total) by mouth every 4 (four) hours as needed for severe pain (pain score 7-10)., Disp: 30 tablet, Rfl: 0   PARoxetine (PAXIL) 30 MG tablet, Take 30 mg by mouth daily., Disp: , Rfl:    sennosides-docusate sodium (SENOKOT-S) 8.6-50 MG tablet, Take 2 tablets by mouth daily., Disp: 30 tablet, Rfl: 1   topiramate (TOPAMAX) 25 MG tablet, Take 25 mg by mouth daily., Disp: , Rfl:    traMADol (ULTRAM) 50 MG tablet, Take 50 mg by mouth 3 (three) times daily as needed., Disp: , Rfl:   Observations/Objective: Patient is well-developed, well-nourished in no acute distress.  Resting comfortably  at home.  Head is normocephalic, atraumatic.  No labored breathing.  Speech is clear and coherent with logical content.  Patient is alert and oriented at baseline.    Assessment and Plan: There are no diagnoses linked to this encounter. Increase fluids, warm salt water gargles, tylenol or ibuprofen as directed, UC as needed.   Follow Up Instructions: I discussed the assessment and treatment plan with the patient. The patient was provided an opportunity to ask questions and all were answered. The patient agreed with the plan and demonstrated an understanding of the instructions.  A copy of instructions were sent to the patient via MyChart unless otherwise noted below.     The patient was advised to call back or seek an in-person evaluation if the symptoms worsen or if the condition fails to improve as anticipated.    Georgana Curio, FNP

## 2023-10-26 ENCOUNTER — Telehealth: Payer: Self-pay

## 2023-10-26 ENCOUNTER — Telehealth: Payer: Self-pay | Admitting: *Deleted

## 2023-10-26 NOTE — Telephone Encounter (Signed)
 Primary Cardiologist:Gayatri Wynell Balloon, MD   Preoperative team, please contact this patient and set up a phone call appointment for further preoperative risk assessment. Please obtain consent and complete medication review. Thank you for your help.   I confirm that guidance regarding antiplatelet and oral anticoagulation therapy has been completed and, if necessary, noted below (not on aspirin for cardiac indication).  I also confirmed the patient resides in the state of [place]. As per [facility] Medical Board telemedicine laws, the patient must reside in the state in which the provider is licensed.   Levi Aland, NP-C  10/26/2023, 11:46 AM [address] Office [phone] Fax [phone]

## 2023-10-26 NOTE — Telephone Encounter (Signed)
 Pt has been scheduled tele preop appt 01/04/24. Med rec and consent are done.      Patient Consent for Virtual Visit        Gloria Hall has provided verbal consent on 10/26/2023 for a virtual visit (video or telephone).   CONSENT FOR VIRTUAL VISIT FOR:  Gloria Hall  By participating in this virtual visit I agree to the following:  I hereby voluntarily request, consent and authorize [facility] and its employed or contracted physicians, physician assistants, nurse practitioners or other licensed health care professionals (the Practitioner), to provide me with telemedicine health care services (the "Services") as deemed necessary by the treating Practitioner. I acknowledge and consent to receive the Services by the Practitioner via telemedicine. I understand that the telemedicine visit will involve communicating with the Practitioner through live audiovisual communication technology and the disclosure of certain medical information by electronic transmission. I acknowledge that I have been given the opportunity to request an in-person assessment or other available alternative prior to the telemedicine visit and am voluntarily participating in the telemedicine visit.  I understand that I have the right to withhold or withdraw my consent to the use of telemedicine in the course of my care at any time, without affecting my right to future care or treatment, and that the Practitioner or I may terminate the telemedicine visit at any time. I understand that I have the right to inspect all information obtained and/or recorded in the course of the telemedicine visit and may receive copies of available information for a reasonable fee.  I understand that some of the potential risks of receiving the Services via telemedicine include:  Delay or interruption in medical evaluation due to technological equipment failure or disruption; Information transmitted may not be sufficient (e.g. poor resolution  of images) to allow for appropriate medical decision making by the Practitioner; and/or  In rare instances, security protocols could fail, causing a breach of personal health information.  Furthermore, I acknowledge that it is my responsibility to provide information about my medical history, conditions and care that is complete and accurate to the best of my ability. I acknowledge that Practitioner's advice, recommendations, and/or decision may be based on factors not within their control, such as incomplete or inaccurate data provided by me or distortions of diagnostic images or specimens that may result from electronic transmissions. I understand that the practice of medicine is not an exact science and that Practitioner makes no warranties or guarantees regarding treatment outcomes. I acknowledge that a copy of this consent can be made available to me via my patient portal [facility] MyChart), or I can request a printed copy by calling the office of [facility].    I understand that my insurance will be billed for this visit.   I have read or had this consent read to me. I understand the contents of this consent, which adequately explains the benefits and risks of the Services being provided via telemedicine.  I have been provided ample opportunity to ask questions regarding this consent and the Services and have had my questions answered to my satisfaction. I give my informed consent for the services to be provided through the use of telemedicine in my medical care

## 2023-10-26 NOTE — Telephone Encounter (Signed)
 Pt states surgery is not until 01/30/24.

## 2023-10-26 NOTE — Telephone Encounter (Signed)
 Pt has been scheduled tele preop appt 01/04/24. Med rec and consent are done.

## 2023-10-26 NOTE — Telephone Encounter (Signed)
   Pre-operative Risk Assessment    Patient Name: Gloria Hall  DOB: September 16, 1970 MRN: [id number]   Date of last office visit: 04/03/23 Date of next office visit: Not Scheduled   Request for Surgical Clearance    Procedure:   Right Total Knee Arthroplasty  Date of Surgery:  Clearance TBD                                Surgeon:  Teryl Lucy MD Surgeon's Group or Practice Name:  [facility] Phone number:  [phone] Fax number:  [phone]   Type of Clearance Requested:   - Medical    Type of Anesthesia:  Spinal   Additional requests/questions:    Garrel Ridgel   10/26/2023, 10:51 AM

## 2023-10-27 ENCOUNTER — Telehealth: Payer: Self-pay | Admitting: Internal Medicine

## 2023-10-27 NOTE — Telephone Encounter (Signed)
 Patient is on the line wanting to know if there was a sooner appt due to [facility] stating that 01/04/24 would be too late for the approval to go through to have surgery so they would have to reschedule it

## 2023-10-27 NOTE — Telephone Encounter (Signed)
 YouJust now (2:00 PM)    S/w the pt and she tells me the surgeon's office said the 01/04/24 tele is not in time enough for the 01/30/24 surgery date. I explained cannot schedule too soon as clearance is only good for a short time. Moved up tele preop appt to 12/29/23. I will update the surgeon's office as FYI.       Note   You  Gloria, Hall [phone] minutes ago (1:57 PM)   Gloria Hall, Aneta Mins routed conversation to [employer] Preop Callback16 minutes ago (1:43 PM)   Hall, Phillip16 minutes ago (1:43 PM)   PP Patient is on the line wanting to know if there was a sooner appt due to [employer] stating that 01/04/24 would be too late for the approval to go through to have surgery so they would have to reschedule it       Note   Gloria, Hall [phone]  Gloria Hall

## 2023-10-27 NOTE — Telephone Encounter (Signed)
 S/w the pt and she tells me the surgeon's office said the 01/04/24 tele is not in time enough for the 01/30/24 surgery date. I explained cannot schedule too soon as clearance is only good for a short time. Moved up tele preop appt to 12/29/23. I will update the surgeon's office as FYI.

## 2023-12-25 NOTE — Progress Notes (Signed)
 Surgery orders requested via Epic inbox.

## 2023-12-29 ENCOUNTER — Ambulatory Visit: Attending: Cardiology

## 2023-12-29 DIAGNOSIS — Z01810 Encounter for preprocedural cardiovascular examination: Secondary | ICD-10-CM | POA: Insufficient documentation

## 2023-12-29 NOTE — Progress Notes (Signed)
 Virtual Visit via Telephone Note   Because of Gloria Hall co-morbid illnesses, she is at least at moderate risk for complications without adequate follow up.  This format is felt to be most appropriate for this patient at this time.  Due to technical limitations with video connection (technology), today's appointment will be conducted as an audio only telehealth visit, and Gloria Hall verbally agreed to proceed in this manner.   All issues noted in this document were discussed and addressed.  No physical exam could be performed with this format.  Evaluation Performed:  Preoperative cardiovascular risk assessment _____________   Date:  12/29/2023   Patient ID:  Gloria Hall, DOB Oct 21, 1970, MRN [id number] Patient Location:  Home Provider location:   Office  Primary Care Provider:  Patient, No Pcp Per Primary Cardiologist:  Gloria Herrlich, MD  Chief Complaint / Patient Profile   53 y.o. y/o female with a h/o HTN, HLD, morbid obesity, hypothyroidism palpitations, anxiety who is pending right total knee arthroplasty and presents today for telephonic preoperative cardiovascular risk assessment.  History of Present Illness    Gloria Hall is a 53 y.o. female who presents via audio/video conferencing for a telehealth visit today.  Pt was last seen in cardiology clinic on 04/03/2023 by Gloria Jetty, NP.  At that time Gloria Hall was doing well with improvement to palpitations and was given clearance for left total knee replacement at that time.  She underwent a 2D echo that showed normal EF with no valve abnormalities present.  The patient is now pending procedure as outlined above. Since her last visit, she has been doing well with no new cardiac complaints.  She reports that her palpitations have improved and are no longer bothersome.  She is able to complete greater than 4 METS of activity and is only limited by arthritic pain in her knees.  She denies chest pain, shortness of  breath, lower extremity edema, fatigue, palpitations, melena, hematuria, hemoptysis, diaphoresis, weakness, presyncope, syncope, orthopnea, and PND.    Past Medical History    Past Medical History:  Diagnosis Date   Anxiety    Arthritis    Back pain, chronic    Bronchitis    Depression    Fibromyalgia    GERD (gastroesophageal reflux disease)    Heart palpitations    Hypothyroidism    IBS (irritable bowel syndrome)    LSIL (low grade squamous intraepithelial lesion) on Pap smear 08/21/2011   Migraines    Palpitations    Peripheral neuropathic pain    in hands   Primary localized osteoarthritis of left knee 08/04/2023   Sleep apnea    does not have cpap   Past Surgical History:  Procedure Laterality Date   COLONOSCOPY  04/17/2023   Dr. Kimble Pennant at [facility] AND CURETTAGE OF UTERUS     dilation and curretage     after a miscarriage   ENDOMETRIAL ABLATION     KNEE ARTHROSCOPY Left 12/01/2013   LAPAROSCOPY  03/19/2011   Procedure: LAPAROSCOPY OPERATIVE;  Surgeon: Katrina Parma C. Everardo Hitch, MD;  Location: [facility];  Service: Gynecology;  Laterality: N/A;   NOVASURE ABLATION  03/19/2011   Procedure: NOVASURE ABLATION;  Surgeon: Katrina Parma C. Everardo Hitch, MD;  Location: [facility];  Service: Gynecology;  Laterality: N/A;   TOTAL KNEE ARTHROPLASTY Left 08/04/2023   Procedure: TOTAL KNEE ARTHROPLASTY;  Surgeon: Osa Blase, MD;  Location: [facility];  Service: Orthopedics;  Laterality: Left;   TUBAL LIGATION  1993  Allergies  Allergies  Allergen Reactions   Imitrex  [Sumatriptan ] Other (See Comments)    Throat tightness x 15 minutes; no swelling or itching    Home Medications    Prior to Admission medications   Medication Sig Start Date End Date Taking? Authorizing Provider  acetaminophen  (TYLENOL ) 500 MG tablet Take 1,000 mg by mouth every 6 (six) hours as needed for moderate pain (pain score 4-6).    [provider]  busPIRone (BUSPAR) 15 MG tablet Take 15 mg by mouth 2 (two) times  daily. 03/30/23   [provider]  ezetimibe  (ZETIA ) 10 MG tablet Take 1 tablet (10 mg total) by mouth daily. 04/30/23   Acharya, Gayatri A, MD  levothyroxine (SYNTHROID) 25 MCG tablet Take 25 mcg by mouth daily before breakfast.    [provider]  meloxicam  (MOBIC ) 15 MG tablet Take 15 mg by mouth daily. 03/16/23   [provider]  methocarbamol  (ROBAXIN ) 750 MG tablet Take 1 tablet (750 mg total) by mouth 3 (three) times daily. 08/04/23   Osa Blase, MD  ondansetron  (ZOFRAN ) 8 MG tablet Take 1 tablet (8 mg total) by mouth every 8 (eight) hours as needed for nausea or vomiting. 08/04/23   Osa Blase, MD  PARoxetine (PAXIL) 30 MG tablet Take 30 mg by mouth daily. 03/20/21   [provider]  topiramate  (TOPAMAX ) 25 MG tablet Take 25 mg by mouth daily.    [provider]    Physical Exam    Vital Signs:  Gloria Hall does not have vital signs available for review today.  Given telephonic nature of communication, physical exam is limited. AAOx3. NAD. Normal affect.  Speech and respirations are unlabored.  Accessory Clinical Findings    None  Assessment & Plan    1.  Preoperative Cardiovascular Risk Assessment: - Patient's RCRI score is 0.9%  The patient affirms she has been doing well without any new cardiac symptoms. They are able to achieve 6 METS without cardiac limitations. Therefore, based on ACC/AHA guidelines, the patient would be at acceptable risk for the planned procedure without further cardiovascular testing. The patient was advised that if she develops new symptoms prior to surgery to contact our office to arrange for a follow-up visit, and she verbalized understanding.   The patient was advised that if she develops new symptoms prior to surgery to contact our office to arrange for a follow-up visit, and she verbalized understanding.   A copy of this note will be routed to requesting surgeon.  Time:   Today, I have spent 6  minutes with the patient with telehealth technology discussing medical history, symptoms, and management plan.     Gloria Hall, Gloria Cast, NP  12/29/2023, 7:31 AM

## 2023-12-30 NOTE — Patient Instructions (Signed)
 SURGICAL WAITING ROOM VISITATION  Patients having surgery or a procedure may have no more than 2 support people in the waiting area - these visitors may rotate.    Children under the age of 35 must have an adult with them who is not the patient.  Due to an increase in RSV and influenza rates and associated hospitalizations, children ages 66 and under may not visit patients in [facility] hospitals.  Visitors with respiratory illnesses are discouraged from visiting and should remain at home.  If the patient needs to stay at the hospital during part of their recovery, the visitor guidelines for inpatient rooms apply. Pre-op nurse will coordinate an appropriate time for 1 support person to accompany patient in pre-op.  This support person may not rotate.    Please refer to the [facility] website for the visitor guidelines for Inpatients (after your surgery is over and you are in a regular room).       Your procedure is scheduled on: 01/12/24   Report to [facility] Main Entrance    Report to admitting at : 8:30 AM   Call this number if you have problems the morning of surgery [phone]   Do not eat food :After Midnight.   After Midnight you may have the following liquids until : 8:00 AM DAY OF SURGERY  Water  Non-Citrus Juices (without pulp, NO RED-Apple, White grape, White cranberry) Black Coffee (NO MILK/CREAM OR CREAMERS, sugar ok)  Clear Tea (NO MILK/CREAM OR CREAMERS, sugar ok) regular and decaf                             Plain Jell-O (NO RED)                                           Fruit ices (not with fruit pulp, NO RED)                                     Popsicles (NO RED)                                                               Sports drinks like Gatorade (NO RED)   The day of surgery:  Drink ONE (1) Pre-Surgery Clear Ensure at: 8:00 AM the morning of surgery. Drink in one sitting. Do not sip.  This drink was given to you during your hospital  pre-op  appointment visit. Nothing else to drink after completing the  Pre-Surgery Clear Ensure or G2.          If you have questions, please contact your surgeon's office.  FOLLOW ANY ADDITIONAL PRE OP INSTRUCTIONS YOU RECEIVED FROM YOUR SURGEON'S OFFICE!!!   Oral Hygiene is also important to reduce your risk of infection.                                    Remember - BRUSH YOUR TEETH THE MORNING OF SURGERY WITH YOUR REGULAR TOOTHPASTE  DENTURES  WILL BE REMOVED PRIOR TO SURGERY PLEASE DO NOT APPLY "Poly grip" OR ADHESIVES!!!   Do NOT smoke after Midnight   Stop all vitamins and herbal supplements 7 days before surgery.   Take these medicines the morning of surgery with A SIP OF WATER : buspirone,topiramate ,paroxetine,levothyroxine.                              You may not have any metal on your body including hair pins, jewelry, and body piercing             Do not wear make-up, lotions, powders, perfumes/cologne, or deodorant  Do not wear nail polish including gel and S&S, artificial/acrylic nails, or any other type of covering on natural nails including finger and toenails. If you have artificial nails, gel coating, etc. that needs to be removed by a nail salon please have this removed prior to surgery or surgery may need to be canceled/ delayed if the surgeon/ anesthesia feels like they are unable to be safely monitored.   Do not shave  48 hours prior to surgery.    Do not bring valuables to the hospital. [facility] IS NOT             RESPONSIBLE   FOR VALUABLES.   Contacts, glasses, dentures or bridgework may not be worn into surgery.   Bring small overnight bag day of surgery.   DO NOT BRING YOUR HOME MEDICATIONS TO THE HOSPITAL. PHARMACY WILL DISPENSE MEDICATIONS LISTED ON YOUR MEDICATION LIST TO YOU DURING YOUR ADMISSION IN THE HOSPITAL!    Patients discharged on the day of surgery will not be allowed to drive home.  Someone NEEDS to stay with you for the first 24 hours after  anesthesia.   Special Instructions: Bring a copy of your healthcare power of attorney and living will documents the day of surgery if you haven't scanned them before.              Please read over the following fact sheets you were given: IF YOU HAVE QUESTIONS ABOUT YOUR PRE-OP INSTRUCTIONS PLEASE CALL [phone]   If you received a COVID test during your pre-op visit  it is requested that you wear a mask when out in public, stay away from anyone that may not be feeling well and notify your surgeon if you develop symptoms. If you test positive for Covid or have been in contact with anyone that has tested positive in the last 10 days please notify you surgeon.     Pre-operative 5 CHG Bath Instructions   You can play a key role in reducing the risk of infection after surgery. Your skin needs to be as free of germs as possible. You can reduce the number of germs on your skin by washing with CHG (chlorhexidine  gluconate) soap before surgery. CHG is an antiseptic soap that kills germs and continues to kill germs even after washing.   DO NOT use if you have an allergy to chlorhexidine /CHG or antibacterial soaps. If your skin becomes reddened or irritated, stop using the CHG and notify one of our RNs at [phone].   Please shower with the CHG soap starting 4 days before surgery using the following schedule:     Please keep in mind the following:  DO NOT shave, including legs and underarms, starting the day of your first shower.   You may shave your face at any point before/day of surgery.  Place  clean sheets on your bed the day you start using CHG soap. Use a clean washcloth (not used since being washed) for each shower. DO NOT sleep with pets once you start using the CHG.   CHG Shower Instructions:  If you choose to wash your hair and private area, wash first with your normal shampoo/soap.  After you use shampoo/soap, rinse your hair and body thoroughly to remove shampoo/soap residue.   Turn the water  OFF and apply about 3 tablespoons (45 ml) of CHG soap to a CLEAN washcloth.  Apply CHG soap ONLY FROM YOUR NECK DOWN TO YOUR TOES (washing for 3-5 minutes)  DO NOT use CHG soap on face, private areas, open wounds, or sores.  Pay special attention to the area where your surgery is being performed.  If you are having back surgery, having someone wash your back for you may be helpful. Wait 2 minutes after CHG soap is applied, then you may rinse off the CHG soap.  Pat dry with a clean towel  Put on clean clothes/pajamas   If you choose to wear lotion, please use ONLY the CHG-compatible lotions on the back of this paper.     Additional instructions for the day of surgery: DO NOT APPLY any lotions, deodorants, cologne, or perfumes.   Put on clean/comfortable clothes.  Brush your teeth.  Ask your nurse before applying any prescription medications to the skin.   CHG Compatible Lotions   Aveeno Moisturizing lotion  Cetaphil Moisturizing Cream  Cetaphil Moisturizing Lotion  Clairol Herbal Essence Moisturizing Lotion, Dry Skin  Clairol Herbal Essence Moisturizing Lotion, Extra Dry Skin  Clairol Herbal Essence Moisturizing Lotion, Normal Skin  Curel Age Defying Therapeutic Moisturizing Lotion with Alpha Hydroxy  Curel Extreme Care Body Lotion  Curel Soothing Hands Moisturizing Hand Lotion  Curel Therapeutic Moisturizing Cream, Fragrance-Free  Curel Therapeutic Moisturizing Lotion, Fragrance-Free  Curel Therapeutic Moisturizing Lotion, Original Formula  Eucerin Daily Replenishing Lotion  Eucerin Dry Skin Therapy Plus Alpha Hydroxy Crme  Eucerin Dry Skin Therapy Plus Alpha Hydroxy Lotion  Eucerin Original Crme  Eucerin Original Lotion  Eucerin Plus Crme Eucerin Plus Lotion  Eucerin TriLipid Replenishing Lotion  Keri Anti-Bacterial Hand Lotion  Keri Deep Conditioning Original Lotion Dry Skin Formula Softly Scented  Keri Deep Conditioning Original Lotion, Fragrance Free  Sensitive Skin Formula  Keri Lotion Fast Absorbing Fragrance Free Sensitive Skin Formula  Keri Lotion Fast Absorbing Softly Scented Dry Skin Formula  Keri Original Lotion  Keri Skin Renewal Lotion Keri Silky Smooth Lotion  Keri Silky Smooth Sensitive Skin Lotion  Nivea Body Creamy Conditioning Oil  Nivea Body Extra Enriched Lotion  Nivea Body Original Lotion  Nivea Body Sheer Moisturizing Lotion Nivea Crme  Nivea Skin Firming Lotion  NutraDerm 30 Skin Lotion  NutraDerm Skin Lotion  NutraDerm Therapeutic Skin Cream  NutraDerm Therapeutic Skin Lotion  ProShield Protective Hand Cream  Provon moisturizing lotion   Incentive Spirometer  An incentive spirometer is a tool that can help keep your lungs clear and active. This tool measures how well you are filling your lungs with each breath. Taking long deep breaths may help reverse or decrease the chance of developing breathing (pulmonary) problems (especially infection) following: A long period of time when you are unable to move or be active. BEFORE THE PROCEDURE  If the spirometer includes an indicator to show your best effort, your nurse or respiratory therapist will set it to a desired goal. If possible, sit up straight or lean slightly forward. Try not  to slouch. Hold the incentive spirometer in an upright position. INSTRUCTIONS FOR USE  Sit on the edge of your bed if possible, or sit up as far as you can in bed or on a chair. Hold the incentive spirometer in an upright position. Breathe out normally. Place the mouthpiece in your mouth and seal your lips tightly around it. Breathe in slowly and as deeply as possible, raising the piston or the ball toward the top of the column. Hold your breath for 3-5 seconds or for as long as possible. Allow the piston or ball to fall to the bottom of the column. Remove the mouthpiece from your mouth and breathe out normally. Rest for a few seconds and repeat Steps 1 through 7 at least 10 times  every 1-2 hours when you are awake. Take your time and take a few normal breaths between deep breaths. The spirometer may include an indicator to show your best effort. Use the indicator as a goal to work toward during each repetition. After each set of 10 deep breaths, practice coughing to be sure your lungs are clear. If you have an incision (the cut made at the time of surgery), support your incision when coughing by placing a pillow or rolled up towels firmly against it. Once you are able to get out of bed, walk around indoors and cough well. You may stop using the incentive spirometer when instructed by your caregiver.  RISKS AND COMPLICATIONS Take your time so you do not get dizzy or light-headed. If you are in pain, you may need to take or ask for pain medication before doing incentive spirometry. It is harder to take a deep breath if you are having pain. AFTER USE Rest and breathe slowly and easily. It can be helpful to keep track of a log of your progress. Your caregiver can provide you with a simple table to help with this. If you are using the spirometer at home, follow these instructions: SEEK MEDICAL CARE IF:  You are having difficultly using the spirometer. You have trouble using the spirometer as often as instructed. Your pain medication is not giving enough relief while using the spirometer. You develop fever of 100.5 F (38.1 C) or higher. SEEK IMMEDIATE MEDICAL CARE IF:  You cough up bloody sputum that had not been present before. You develop fever of 102 F (38.9 C) or greater. You develop worsening pain at or near the incision site. MAKE SURE YOU:  Understand these instructions. Will watch your condition. Will get help right away if you are not doing well or get worse. Document Released: 12/01/2006 Document Revised: 10/13/2011 Document Reviewed: 02/01/2007 [facility] Patient Information 2014 [place],  [place].   ________________________________________________________________________

## 2023-12-31 ENCOUNTER — Other Ambulatory Visit: Payer: Self-pay

## 2023-12-31 ENCOUNTER — Encounter (HOSPITAL_COMMUNITY)
Admission: RE | Admit: 2023-12-31 | Discharge: 2023-12-31 | Disposition: A | Discharge status: Home / Self Care | Source: Ambulatory Visit | Attending: Orthopedic Surgery | Admitting: Orthopedic Surgery

## 2023-12-31 ENCOUNTER — Encounter (HOSPITAL_COMMUNITY): Payer: Self-pay

## 2023-12-31 VITALS — BP 146/95 | HR 90 | Temp 98.3°F | Ht 64.0 in | Wt 229.0 lb

## 2023-12-31 DIAGNOSIS — E039 Hypothyroidism, unspecified: Secondary | ICD-10-CM | POA: Insufficient documentation

## 2023-12-31 DIAGNOSIS — Z87891 Personal history of nicotine dependence: Secondary | ICD-10-CM | POA: Diagnosis not present

## 2023-12-31 DIAGNOSIS — Z01812 Encounter for preprocedural laboratory examination: Secondary | ICD-10-CM | POA: Insufficient documentation

## 2023-12-31 DIAGNOSIS — I10 Essential (primary) hypertension: Secondary | ICD-10-CM | POA: Insufficient documentation

## 2023-12-31 DIAGNOSIS — Z01818 Encounter for other preprocedural examination: Secondary | ICD-10-CM

## 2023-12-31 DIAGNOSIS — R002 Palpitations: Secondary | ICD-10-CM | POA: Diagnosis not present

## 2023-12-31 DIAGNOSIS — M1711 Unilateral primary osteoarthritis, right knee: Secondary | ICD-10-CM | POA: Diagnosis not present

## 2023-12-31 HISTORY — DX: Prediabetes: R73.03

## 2023-12-31 HISTORY — DX: Cardiac arrhythmia, unspecified: I49.9

## 2023-12-31 LAB — SURGICAL PCR SCREEN
MRSA, PCR: NEGATIVE
Staphylococcus aureus: NEGATIVE

## 2023-12-31 NOTE — Progress Notes (Signed)
 For Anesthesia: PCP - ASHLEY VANSTORY . LOV: 12/22/23 Cardiologist -  Euell Herrlich, MD  Clearance: Francene Ing, Retha Cast, NP : 12/29/23 Bowel Prep reminder:  Chest x-ray -  EKG - 04/03/23 Stress Test -  ECHO - 04/07/23 Cardiac Cath -  Pacemaker/ICD device last checked: Pacemaker orders received: Device Rep notified:  Spinal Cord Stimulator:N/A  Sleep Study - Yes CPAP - NO  Fasting Blood Sugar - N/A Checks Blood Sugar _____ times a day Date and result of last Hgb A1c-  Last dose of GLP1 agonist- N/A GLP1 instructions:   Last dose of SGLT-2 inhibitors- N/A SGLT-2 instructions:   Blood Thinner Instructions:N/A Aspirin  Instructions: Last Dose:  Activity level: Can go up a flight of stairs and activities of daily living without stopping and without chest pain and/or shortness of breath   Able to exercise without chest pain and/or shortness of breath  Anesthesia review: Hx: OSA(NO CPAP),HTN,Palpitations.  Patient denies shortness of breath, fever, cough and chest pain at PAT appointment   Patient verbalized understanding of instructions that were given to them at the PAT appointment. Patient was also instructed that they will need to review over the PAT instructions again at home before surgery.

## 2024-01-01 NOTE — Progress Notes (Signed)
 Anesthesia Chart Review   Case: [id number] Date/Time: 01/12/24 1045   Procedure: ARTHROPLASTY, KNEE, TOTAL (Right: Knee)   Anesthesia type: Spinal   Diagnosis: Primary localized osteoarthrosis of the knee, right [M17.11]   Pre-op diagnosis: Primary localized osteoarthrosis of the knee, right   Location: [facility] ROOM 07 / [facility]   Surgeons: Osa Blase, MD       DISCUSSION:53 y.o. former smoker with h/o sleep apnea, hypothyroidism, palpitations, right knee OA scheduled for above procedure 01/12/2024 with Dr. Osa Blase.   Per cardiology preoperative evaluation 12/29/2023, "- Patient's RCRI score is 0.9%   The patient affirms she has been doing well without any new cardiac symptoms. They are able to achieve 6 METS without cardiac limitations. Therefore, based on ACC/AHA guidelines, the patient would be at acceptable risk for the planned procedure without further cardiovascular testing. The patient was advised that if she develops new symptoms prior to surgery to contact our office to arrange for a follow-up visit, and she verbalized understanding."  VS: BP (!) 146/95   Pulse 90   Temp 36.8 C (Oral)   Ht 5\' 4"  (1.626 m)   Wt 103.9 kg   LMP 07/04/2014   SpO2 98%   BMI 39.31 kg/m   PROVIDERS: Patient, No Pcp Per Primary Cardiologist:  Euell Herrlich, MD   LABS: Labs reviewed: Acceptable for surgery. (all labs ordered are listed, but only abnormal results are displayed)  Labs Reviewed  SURGICAL PCR SCREEN     IMAGES:   EKG:   CV: Echo 04/27/2023  1. Left ventricular ejection fraction, by estimation, is 60 to 65%. The  left ventricle has normal function. The left ventricle has no regional  wall motion abnormalities. Left ventricular diastolic parameters were  normal.   2. Right ventricular systolic function is normal. The right ventricular  size is normal.   3. The mitral valve is normal in structure. Trivial mitral valve  regurgitation. No evidence of mitral  stenosis.   4. The aortic valve is tricuspid. Aortic valve regurgitation is not  visualized. No aortic stenosis is present.   5. The inferior vena cava is normal in size with greater than 50%  respiratory variability, suggesting right atrial pressure of 3 mmHg.   Comparison(s): No prior Echocardiogram.   Past Medical History:  Diagnosis Date   Anxiety    Arthritis    Back pain, chronic    Bronchitis    Depression    Dysrhythmia    Fibromyalgia    GERD (gastroesophageal reflux disease)    Heart palpitations    Hypertension    Hypothyroidism    IBS (irritable bowel syndrome)    LSIL (low grade squamous intraepithelial lesion) on Pap smear 08/21/2011   Migraines    Palpitations    Peripheral neuropathic pain    in hands   Pre-diabetes    Primary localized osteoarthritis of left knee 08/04/2023   Sleep apnea    does not have cpap    Past Surgical History:  Procedure Laterality Date   COLONOSCOPY  04/17/2023   Dr. Kimble Pennant at [facility] AND CURETTAGE OF UTERUS     dilation and curretage     after a miscarriage   ENDOMETRIAL ABLATION     KNEE ARTHROSCOPY Left 12/01/2013   LAPAROSCOPY  03/19/2011   Procedure: LAPAROSCOPY OPERATIVE;  Surgeon: Katrina Parma C. Everardo Hitch, MD;  Location: [facility];  Service: Gynecology;  Laterality: N/A;   NOVASURE ABLATION  03/19/2011   Procedure: NOVASURE ABLATION;  Surgeon: Juanell Nora. Everardo Hitch, MD;  Location: [facility];  Service: Gynecology;  Laterality: N/A;   TOTAL KNEE ARTHROPLASTY Left 08/04/2023   Procedure: TOTAL KNEE ARTHROPLASTY;  Surgeon: Osa Blase, MD;  Location: [facility];  Service: Orthopedics;  Laterality: Left;   TUBAL LIGATION  1993    MEDICATIONS:  acetaminophen  (TYLENOL ) 500 MG tablet   busPIRone (BUSPAR) 15 MG tablet   ezetimibe  (ZETIA ) 10 MG tablet   levothyroxine (SYNTHROID) 25 MCG tablet   meloxicam  (MOBIC ) 15 MG tablet   methocarbamol  (ROBAXIN ) 750 MG tablet   ondansetron  (ZOFRAN ) 8 MG tablet   PARoxetine (PAXIL) 30 MG tablet    topiramate  (TOPAMAX ) 25 MG tablet   No current facility-administered medications for this encounter.    Chick Cotton Ward, PA-C [facility] Pre-Surgical Testing [phone]

## 2024-01-04 ENCOUNTER — Telehealth

## 2024-01-04 NOTE — H&P (Signed)
 KNEE ARTHROPLASTY ADMISSION H&P  Patient ID: Gloria Hall MRN: [id number] DOB/AGE: 54/02/1971 52 y.o.  Chief Complaint: right knee pain.  Planned Procedure Date: 01/12/24 Medical Clearance by Vernette Goo   Cardiac Clearance by Charles Connor, NP  HPI: Gloria Hall is a 53 y.o. female who presents for evaluation of Primary localized osteoarthrosis of the knee, right. The patient has a history of pain and functional disability in the right knee due to arthritis and has failed non-surgical conservative treatments for greater than 12 weeks to include NSAID's and/or analgesics, corticosteriod injections, weight reduction as appropriate, and activity modification.  Onset of symptoms was gradual, starting 5 years ago with gradually worsening course since that time. The patient noted no past surgery on the right knee.  Patient currently rates pain at 5 out of 10 with activity. Patient has worsening of pain with activity and weight bearing and pain that interferes with activities of daily living.  Patient has evidence of joint space narrowing by imaging studies.  There is no active infection.  Past Medical History:  Diagnosis Date   Anxiety    Arthritis    Back pain, chronic    Bronchitis    Depression    Dysrhythmia    Fibromyalgia    GERD (gastroesophageal reflux disease)    Heart palpitations    Hypertension    Hypothyroidism    IBS (irritable bowel syndrome)    LSIL (low grade squamous intraepithelial lesion) on Pap smear 08/21/2011   Migraines    Palpitations    Peripheral neuropathic pain    in hands   Pre-diabetes    Primary localized osteoarthritis of left knee 08/04/2023   Sleep apnea    does not have cpap   Past Surgical History:  Procedure Laterality Date   COLONOSCOPY  04/17/2023   Dr. Kimble Pennant at [facility] AND CURETTAGE OF UTERUS     dilation and curretage     after a miscarriage   ENDOMETRIAL ABLATION     KNEE ARTHROSCOPY Left 12/01/2013   LAPAROSCOPY   03/19/2011   Procedure: LAPAROSCOPY OPERATIVE;  Surgeon: Katrina Parma C. Everardo Hitch, MD;  Location: [facility];  Service: Gynecology;  Laterality: N/A;   NOVASURE ABLATION  03/19/2011   Procedure: NOVASURE ABLATION;  Surgeon: Katrina Parma C. Everardo Hitch, MD;  Location: [facility];  Service: Gynecology;  Laterality: N/A;   TOTAL KNEE ARTHROPLASTY Left 08/04/2023   Procedure: TOTAL KNEE ARTHROPLASTY;  Surgeon: Osa Blase, MD;  Location: [facility];  Service: Orthopedics;  Laterality: Left;   TUBAL LIGATION  1993   Allergies  Allergen Reactions   Imitrex  [Sumatriptan ] Other (See Comments)    Throat tightness x 15 minutes; no swelling or itching   Prior to Admission medications   Medication Sig Start Date End Date Taking? Authorizing Provider  acetaminophen  (TYLENOL ) 500 MG tablet Take 1,000 mg by mouth every 6 (six) hours as needed for moderate pain (pain score 4-6).   Yes [provider]  busPIRone (BUSPAR) 15 MG tablet Take 15 mg by mouth 2 (two) times daily. 03/30/23  Yes [provider]  ezetimibe  (ZETIA ) 10 MG tablet Take 1 tablet (10 mg total) by mouth daily. 04/30/23  Yes Acharya, Gayatri A, MD  levothyroxine (SYNTHROID) 25 MCG tablet Take 25 mcg by mouth daily before breakfast.   Yes [provider]  meloxicam  (MOBIC ) 15 MG tablet Take 15 mg by mouth daily. 03/16/23  Yes [provider]  methocarbamol  (ROBAXIN ) 750 MG tablet Take 1 tablet (750  mg total) by mouth 3 (three) times daily. 08/04/23  Yes Osa Blase, MD  ondansetron  (ZOFRAN ) 8 MG tablet Take 1 tablet (8 mg total) by mouth every 8 (eight) hours as needed for nausea or vomiting. 08/04/23  Yes Osa Blase, MD  PARoxetine (PAXIL) 30 MG tablet Take 30 mg by mouth daily. 03/20/21  Yes [provider]  topiramate  (TOPAMAX ) 25 MG tablet Take 25 mg by mouth daily.   Yes [provider]   Social History   Socioeconomic History   Marital status: Widowed    Spouse name: [name]   Number of children: 3    Years of education: 12th   Highest education level: Not on file  Occupational History    Employer: [employer]    Comment: [employer]  Tobacco Use   Smoking status: Former    Current packs/day: 0.00    Average packs/day: 1.5 packs/day for 10.0 years (15.0 ttl pk-yrs)    Types: Cigarettes    Start date: 05/04/1999    Quit date: 05/03/2009    Years since quitting: 14.6    Passive exposure: Current (minimal)   Smokeless tobacco: Never  Vaping Use   Vaping status: Never Used  Substance and Sexual Activity   Alcohol use: No    Comment: quit in 2008   Drug use: Not Currently    Comment: quit cocaine 17 years ago   Sexual activity: Yes    Birth control/protection: Surgical  Other Topics Concern   Not on file  Social History Narrative   Patient lives at home with spouse.   Caffeine Use: 2 cups of coffee and 2 cups of tea daily   Social Drivers of Health   Financial Resource Strain: Medium Risk (11/03/2022)   Received from [facility], [facility]   Overall Financial Resource Strain (CARDIA)    Difficulty of Paying Living Expenses: Somewhat hard  Food Insecurity: No Food Insecurity (11/03/2022)   Received from [facility], [facility]   Hunger Vital Sign    Worried About Running Out of Food in the Last Year: Never true    Ran Out of Food in the Last Year: Never true  Transportation Needs: No Transportation Needs (11/03/2022)   Received from [facility], [facility]   PRAPARE - Transportation    Lack of Transportation (Medical): No    Lack of Transportation (Non-Medical): No  Physical Activity: Unknown (11/03/2022)   [facility]   Exercise Vital Sign    Days of Exercise per Week: 0 days    Minutes of Exercise per Session: Not on file  Stress: Stress Concern Present (11/03/2022)   Received from [facility] - Occupational Stress  Questionnaire    Feeling of Stress : Rather much  Social Connections: Moderately Integrated (11/03/2022)   Received from [facility], [facility]   Social Network    How would you rate your social network (family, work, friends)?: Adequate participation with social networks   Family History  Problem Relation Age of Onset   Arthritis Mother    Diabetes Mother        58   Hypertension Mother    Hyperlipidemia Mother    Fibromyalgia Mother    Sleep apnea Mother    Migraines Mother    Heart attack Father        x7   Heart Problems Father    Diabetes  Father    Heart Problems Sister    Hypertension Sister    Arthritis Sister    Diabetes Maternal Aunt    Cancer Maternal Aunt        breast, metastatic cancer throughout body   Breast cancer Maternal Aunt    Crohn's disease Maternal Aunt    Cancer Maternal Uncle        leukemia   Cancer Maternal Uncle        colon   Diabetes Maternal Grandmother    Heart attack Maternal Grandfather 55   Heart attack Paternal Grandfather    Healthy Son    Healthy Daughter    Healthy Daughter     ROS: Currently denies lightheadedness, dizziness, Fever, chills, CP, SOB. No personal history of DVT, PE, MI, or CVA. No loose teeth or dentures All other systems have been reviewed and were otherwise currently negative with the exception of those mentioned in the HPI and as above.  Objective: Vitals: Ht: 5\' 6"  Wt: 130.6 lbs BP: 157/85 Pulse: 75 Physical Exam: General: Alert, NAD.  Antalgic Gait  HEENT: EOMI, Good Neck Extension  Pulm: No increased work of breathing.  Clear B/L A/P w/o crackle or wheeze.  CV: RRR, No m/g/r appreciated  GI: soft, NT, ND Neuro: Neuro without gross focal deficit.  Sensation intact distally Skin: No lesions in the area of chief complaint MSK/Surgical Site: right knee w/o redness or effusion.  no JLT. ROM 3-100.  5/5 strength in extension and flexion.  +EHL/FHL.  NVI.  Stable varus and valgus stress.    Imaging  Review Plain radiographs demonstrate severe degenerative joint disease of the right knee.   Preoperative templating of the joint replacement has been completed, documented, and submitted to the Operating Room personnel in order to optimize intra-operative equipment management.  Assessment: Primary localized osteoarthrosis of the knee, right   Plan: Plan for Procedure(s): ARTHROPLASTY, KNEE, TOTAL  The patient history, physical exam, clinical judgement of the provider and imaging are consistent with end stage degenerative joint disease and total joint arthroplasty is deemed medically necessary. The treatment options including medical management, injection therapy, and arthroplasty were discussed at length. The risks and benefits of Procedure(s): ARTHROPLASTY, KNEE, TOTAL were presented and reviewed.  The risks of nonoperative treatment, versus surgical intervention including but not limited to continued pain, aseptic loosening, stiffness, dislocation/subluxation, infection, bleeding, nerve injury, blood clots, cardiopulmonary complications, morbidity, mortality, among others were discussed. The patient verbalizes understanding and wishes to proceed with the plan.  Patient is being admitted for inpatient treatment for surgery, pain control, PT, prophylactic antibiotics, VTE prophylaxis, progressive ambulation, ADL's and discharge planning.   The patient does meet the criteria for TXA which will be used perioperatively.   ASA 325 mg will be used postoperatively for DVT prophylaxis in addition to SCDs, and early ambulation. The patient does not want to have formal physical therapy, she states she did her own therapy after her last total knee and will do the same this time around.    Mandisa Persinger K Vannary Greening, PA-C 01/04/2024 4:10 PM

## 2024-01-12 ENCOUNTER — Encounter (HOSPITAL_COMMUNITY): Payer: Self-pay | Admitting: Orthopedic Surgery

## 2024-01-12 ENCOUNTER — Encounter (HOSPITAL_COMMUNITY)
Admission: RE | Disposition: A | Discharge status: Home / Self Care | Payer: Self-pay | Source: Ambulatory Visit | Attending: Orthopedic Surgery

## 2024-01-12 ENCOUNTER — Other Ambulatory Visit: Payer: Self-pay

## 2024-01-12 ENCOUNTER — Ambulatory Visit (HOSPITAL_COMMUNITY): Admitting: Certified Registered Nurse Anesthetist

## 2024-01-12 ENCOUNTER — Ambulatory Visit (HOSPITAL_COMMUNITY)
Admission: RE | Admit: 2024-01-12 | Discharge: 2024-01-12 | Disposition: A | Discharge status: Home / Self Care | Source: Ambulatory Visit | Attending: Orthopedic Surgery | Admitting: Orthopedic Surgery

## 2024-01-12 ENCOUNTER — Ambulatory Visit (HOSPITAL_COMMUNITY): Payer: Self-pay | Admitting: Medical

## 2024-01-12 ENCOUNTER — Ambulatory Visit (HOSPITAL_COMMUNITY)

## 2024-01-12 DIAGNOSIS — G4730 Sleep apnea, unspecified: Secondary | ICD-10-CM | POA: Diagnosis not present

## 2024-01-12 DIAGNOSIS — Z87891 Personal history of nicotine dependence: Secondary | ICD-10-CM | POA: Diagnosis not present

## 2024-01-12 DIAGNOSIS — M1711 Unilateral primary osteoarthritis, right knee: Secondary | ICD-10-CM | POA: Diagnosis not present

## 2024-01-12 DIAGNOSIS — M170 Bilateral primary osteoarthritis of knee: Secondary | ICD-10-CM | POA: Diagnosis not present

## 2024-01-12 DIAGNOSIS — K219 Gastro-esophageal reflux disease without esophagitis: Secondary | ICD-10-CM | POA: Diagnosis not present

## 2024-01-12 DIAGNOSIS — Z96652 Presence of left artificial knee joint: Secondary | ICD-10-CM | POA: Insufficient documentation

## 2024-01-12 DIAGNOSIS — I10 Essential (primary) hypertension: Secondary | ICD-10-CM | POA: Insufficient documentation

## 2024-01-12 DIAGNOSIS — M797 Fibromyalgia: Secondary | ICD-10-CM | POA: Insufficient documentation

## 2024-01-12 HISTORY — PX: TOTAL KNEE ARTHROPLASTY: SHX125

## 2024-01-12 SURGERY — ARTHROPLASTY, KNEE, TOTAL
Anesthesia: Spinal | Site: Knee | Laterality: Right

## 2024-01-12 MED ORDER — OXYCODONE HCL 5 MG PO TABS
ORAL_TABLET | ORAL | Status: AC
  Filled 2024-01-12: qty 1

## 2024-01-12 MED ORDER — GLYCOPYRROLATE 0.2 MG/ML IJ SOLN
INTRAMUSCULAR | Status: DC | PRN
Start: 2024-01-12 — End: 2024-01-12
  Administered 2024-01-12: .1 mg via INTRAVENOUS

## 2024-01-12 MED ORDER — PHENYLEPHRINE HCL-NACL 20-0.9 MG/250ML-% IV SOLN
INTRAVENOUS | Status: DC | PRN
Start: 2024-01-12 — End: 2024-01-12
  Administered 2024-01-12: 40 ug/min via INTRAVENOUS
  Administered 2024-01-12: 80 ug via INTRAVENOUS

## 2024-01-12 MED ORDER — FENTANYL CITRATE PF 50 MCG/ML IJ SOSY: 25.0000 ug | PREFILLED_SYRINGE | INTRAMUSCULAR | Status: DC | PRN

## 2024-01-12 MED ORDER — FENTANYL CITRATE (PF) 100 MCG/2ML IJ SOLN
INTRAMUSCULAR | Status: DC | PRN
  Administered 2024-01-12 (×3): 50 ug via INTRAVENOUS

## 2024-01-12 MED ORDER — MIDAZOLAM HCL 2 MG/2ML IJ SOLN
INTRAMUSCULAR | Status: AC
  Filled 2024-01-12: qty 2

## 2024-01-12 MED ORDER — ONDANSETRON HCL 4 MG/2ML IJ SOLN
INTRAMUSCULAR | Status: DC | PRN
Start: 2024-01-12 — End: 2024-01-12
  Administered 2024-01-12: 4 mg via INTRAVENOUS

## 2024-01-12 MED ORDER — METHOCARBAMOL 500 MG PO TABS: 500.0000 mg | ORAL_TABLET | Freq: Three times a day (TID) | ORAL | 0 refills | Status: AC | PRN

## 2024-01-12 MED ORDER — LACTATED RINGERS IV BOLUS: 250.0000 mL | Freq: Once | INTRAVENOUS | Status: DC

## 2024-01-12 MED ORDER — OXYCODONE HCL 5 MG PO TABS
5.0000 mg | ORAL_TABLET | Freq: Once | ORAL | Status: AC | PRN
  Administered 2024-01-12: 5 mg via ORAL

## 2024-01-12 MED ORDER — 0.9 % SODIUM CHLORIDE (POUR BTL) OPTIME
TOPICAL | Status: DC | PRN
  Administered 2024-01-12: 1000 mL

## 2024-01-12 MED ORDER — OXYCODONE HCL 5 MG/5ML PO SOLN: 5.0000 mg | Freq: Once | ORAL | Status: AC | PRN

## 2024-01-12 MED ORDER — BUPIVACAINE-EPINEPHRINE (PF) 0.5% -1:200000 IJ SOLN
INTRAMUSCULAR | Status: DC | PRN
  Administered 2024-01-12: 15 mL via PERINEURAL

## 2024-01-12 MED ORDER — ASPIRIN 325 MG PO TBEC: 325.0000 mg | DELAYED_RELEASE_TABLET | Freq: Two times a day (BID) | ORAL | 0 refills | Status: AC

## 2024-01-12 MED ORDER — ACETAMINOPHEN 500 MG PO TABS
1000.0000 mg | ORAL_TABLET | Freq: Once | ORAL | Status: DC
  Filled 2024-01-12: qty 2

## 2024-01-12 MED ORDER — ACETAMINOPHEN 500 MG PO TABS
1000.0000 mg | ORAL_TABLET | Freq: Once | ORAL | Status: AC
  Administered 2024-01-12: 1000 mg via ORAL

## 2024-01-12 MED ORDER — PROPOFOL 500 MG/50ML IV EMUL
INTRAVENOUS | Status: DC | PRN
Start: 2024-01-12 — End: 2024-01-12
  Administered 2024-01-12: 20 mg via INTRAVENOUS
  Administered 2024-01-12: 50 ug/kg/min via INTRAVENOUS
  Administered 2024-01-12: 25 ug/kg/min via INTRAVENOUS
  Administered 2024-01-12 (×4): 20 mg via INTRAVENOUS

## 2024-01-12 MED ORDER — POVIDONE-IODINE 10 % EX SWAB: 2.0000 | Freq: Once | CUTANEOUS | Status: DC

## 2024-01-12 MED ORDER — SODIUM CHLORIDE 0.9 % IR SOLN
Status: DC | PRN
  Administered 2024-01-12: 1000 mL

## 2024-01-12 MED ORDER — CEFAZOLIN SODIUM-DEXTROSE 2-4 GM/100ML-% IV SOLN
2.0000 g | INTRAVENOUS | Status: AC
  Administered 2024-01-12: 2 g via INTRAVENOUS
  Filled 2024-01-12: qty 100

## 2024-01-12 MED ORDER — CLONIDINE HCL (ANALGESIA) 100 MCG/ML EP SOLN
EPIDURAL | Status: DC | PRN
  Administered 2024-01-12: 100 ug

## 2024-01-12 MED ORDER — LACTATED RINGERS IV BOLUS: 500.0000 mL | Freq: Once | INTRAVENOUS | Status: DC

## 2024-01-12 MED ORDER — FENTANYL CITRATE (PF) 100 MCG/2ML IJ SOLN
INTRAMUSCULAR | Status: AC
Start: 2024-01-12 — End: ?
  Filled 2024-01-12: qty 2

## 2024-01-12 MED ORDER — TRANEXAMIC ACID-NACL 1000-0.7 MG/100ML-% IV SOLN
1000.0000 mg | INTRAVENOUS | Status: AC
Start: 2024-01-12 — End: 2024-01-12
  Administered 2024-01-12: 1000 mg via INTRAVENOUS
  Filled 2024-01-12: qty 100

## 2024-01-12 MED ORDER — PROPOFOL 500 MG/50ML IV EMUL
INTRAVENOUS | Status: AC
  Filled 2024-01-12: qty 50

## 2024-01-12 MED ORDER — FENTANYL CITRATE (PF) 100 MCG/2ML IJ SOLN
INTRAMUSCULAR | Status: AC
  Filled 2024-01-12: qty 2

## 2024-01-12 MED ORDER — WATER FOR IRRIGATION, STERILE IR SOLN
Status: DC | PRN
Start: 2024-01-12 — End: 2024-01-12
  Administered 2024-01-12: 2000 mL

## 2024-01-12 MED ORDER — TRANEXAMIC ACID-NACL 1000-0.7 MG/100ML-% IV SOLN
INTRAVENOUS | Status: AC
Start: 2024-01-12 — End: ?
  Filled 2024-01-12: qty 100

## 2024-01-12 MED ORDER — DEXAMETHASONE SODIUM PHOSPHATE 10 MG/ML IJ SOLN
INTRAMUSCULAR | Status: AC
  Filled 2024-01-12: qty 1

## 2024-01-12 MED ORDER — BUPIVACAINE HCL (PF) 0.25 % IJ SOLN
INTRAMUSCULAR | Status: AC
  Filled 2024-01-12: qty 30

## 2024-01-12 MED ORDER — LACTATED RINGERS IV BOLUS
250.0000 mL | Freq: Once | INTRAVENOUS | Status: AC
  Administered 2024-01-12: 250 mL via INTRAVENOUS

## 2024-01-12 MED ORDER — CHLORHEXIDINE GLUCONATE 0.12 % MT SOLN
15.0000 mL | Freq: Once | OROMUCOSAL | Status: AC
  Administered 2024-01-12: 15 mL via OROMUCOSAL

## 2024-01-12 MED ORDER — TRANEXAMIC ACID-NACL 1000-0.7 MG/100ML-% IV SOLN
1000.0000 mg | Freq: Once | INTRAVENOUS | Status: AC
  Administered 2024-01-12: 1000 mg via INTRAVENOUS

## 2024-01-12 MED ORDER — GLYCOPYRROLATE 0.2 MG/ML IJ SOLN
INTRAMUSCULAR | Status: AC
  Filled 2024-01-12: qty 1

## 2024-01-12 MED ORDER — MIDAZOLAM HCL 2 MG/2ML IJ SOLN
INTRAMUSCULAR | Status: DC | PRN
  Administered 2024-01-12 (×2): 1 mg via INTRAVENOUS

## 2024-01-12 MED ORDER — POVIDONE-IODINE 7.5 % EX SOLN: Freq: Once | CUTANEOUS | Status: DC

## 2024-01-12 MED ORDER — DROPERIDOL 2.5 MG/ML IJ SOLN: 0.6250 mg | Freq: Once | INTRAMUSCULAR | Status: DC | PRN

## 2024-01-12 MED ORDER — KETOROLAC TROMETHAMINE 30 MG/ML IJ SOLN
INTRAMUSCULAR | Status: DC | PRN
  Administered 2024-01-12: 31 mL

## 2024-01-12 MED ORDER — KETOROLAC TROMETHAMINE 30 MG/ML IJ SOLN
INTRAMUSCULAR | Status: AC
  Filled 2024-01-12: qty 1

## 2024-01-12 MED ORDER — ORAL CARE MOUTH RINSE: 15.0000 mL | Freq: Once | OROMUCOSAL | Status: AC

## 2024-01-12 MED ORDER — LACTATED RINGERS IV SOLN: INTRAVENOUS | Status: DC

## 2024-01-12 MED ORDER — BUPIVACAINE IN DEXTROSE 0.75-8.25 % IT SOLN
INTRATHECAL | Status: DC | PRN
  Administered 2024-01-12: 1.6 mL via INTRATHECAL

## 2024-01-12 MED ORDER — ONDANSETRON HCL 4 MG/2ML IJ SOLN
INTRAMUSCULAR | Status: AC
  Filled 2024-01-12: qty 2

## 2024-01-12 MED ORDER — PROPOFOL 1000 MG/100ML IV EMUL
INTRAVENOUS | Status: AC
  Filled 2024-01-12: qty 100

## 2024-01-12 MED ORDER — DEXAMETHASONE SODIUM PHOSPHATE 10 MG/ML IJ SOLN
INTRAMUSCULAR | Status: DC | PRN
Start: 2024-01-12 — End: 2024-01-12
  Administered 2024-01-12: 10 mg via INTRAVENOUS

## 2024-01-12 MED ORDER — OXYCODONE HCL 5 MG PO TABS: 5.0000 mg | ORAL_TABLET | ORAL | 0 refills | Status: AC | PRN

## 2024-01-12 SURGICAL SUPPLY — 50 items
ATTUNE PS FEM RT SZ 4 CEM KNEE (Femur) IMPLANT
BAG COUNTER SPONGE SURGICOUNT (BAG) IMPLANT
BAG ZIPLOCK 12X15 (MISCELLANEOUS) IMPLANT
BASEPLATE TIB CMT FB PCKT SZ4 (Stem) IMPLANT
BLADE SAG 18X100X1.27 (BLADE) ×1 IMPLANT
BLADE SAW SGTL 11.0X1.19X90.0M (BLADE) IMPLANT
BLADE SAW SGTL 13X75X1.27 (BLADE) ×1 IMPLANT
BLADE SURG 15 STRL LF DISP TIS (BLADE) ×1 IMPLANT
BNDG ELASTIC 6X10 VLCR STRL LF (GAUZE/BANDAGES/DRESSINGS) ×1 IMPLANT
BOWL SMART MIX CTS (DISPOSABLE) ×1 IMPLANT
CEMENT HV SMART SET (Cement) ×2 IMPLANT
CLSR STERI-STRIP ANTIMIC 1/2X4 (GAUZE/BANDAGES/DRESSINGS) ×2 IMPLANT
COVER SURGICAL LIGHT HANDLE (MISCELLANEOUS) ×1 IMPLANT
CUFF TRNQT CYL 34X4.125X (TOURNIQUET CUFF) ×1 IMPLANT
DRAPE SHEET LG 3/4 BI-LAMINATE (DRAPES) ×1 IMPLANT
DRAPE U-SHAPE 47X51 STRL (DRAPES) ×1 IMPLANT
DRSG MEPILEX POST OP 4X12 (GAUZE/BANDAGES/DRESSINGS) ×1 IMPLANT
DURAPREP 26ML APPLICATOR (WOUND CARE) ×2 IMPLANT
ELECT PENCIL ROCKER SW 15FT (MISCELLANEOUS) ×1 IMPLANT
ELECT REM PT RETURN 15FT ADLT (MISCELLANEOUS) ×1 IMPLANT
GAUZE PAD ABD 8X10 STRL (GAUZE/BANDAGES/DRESSINGS) ×2 IMPLANT
GLOVE BIO SURGEON STRL SZ 6.5 (GLOVE) ×1 IMPLANT
GLOVE BIO SURGEON STRL SZ7.5 (GLOVE) ×1 IMPLANT
GLOVE BIOGEL PI IND STRL 7.0 (GLOVE) ×1 IMPLANT
GLOVE BIOGEL PI IND STRL 8 (GLOVE) ×1 IMPLANT
GOWN STRL SURGICAL XL XLNG (GOWN DISPOSABLE) ×2 IMPLANT
HOLDER FOLEY CATH W/STRAP (MISCELLANEOUS) ×1 IMPLANT
HOOD PEEL AWAY T7 (MISCELLANEOUS) ×3 IMPLANT
IMMOBILIZER KNEE 20 (SOFTGOODS) ×1 IMPLANT
IMMOBILIZER KNEE 20 THIGH 36 (SOFTGOODS) ×1 IMPLANT
INSERT TIB ATTUNE FB SZ4X6 (Insert) IMPLANT
KIT TURNOVER KIT A (KITS) ×1 IMPLANT
MANIFOLD NEPTUNE II (INSTRUMENTS) ×1 IMPLANT
NS IRRIG 1000ML POUR BTL (IV SOLUTION) ×1 IMPLANT
PACK TOTAL KNEE CUSTOM (KITS) ×1 IMPLANT
PATELLA MEDIAL ATTUN 35MM KNEE (Knees) IMPLANT
PIN STEINMAN FIXATION KNEE (PIN) IMPLANT
PROTECTOR NERVE ULNAR (MISCELLANEOUS) ×1 IMPLANT
SET HNDPC FAN SPRY TIP SCT (DISPOSABLE) ×1 IMPLANT
SET PAD KNEE POSITIONER (MISCELLANEOUS) ×1 IMPLANT
SPIKE FLUID TRANSFER (MISCELLANEOUS) IMPLANT
SUT MNCRL AB 3-0 PS2 18 (SUTURE) IMPLANT
SUT STRATAFIX PDS+ 0 24IN (SUTURE) ×1 IMPLANT
SUT VIC AB 2-0 CT1 TAPERPNT 27 (SUTURE) ×2 IMPLANT
SUT VIC AB 3-0 SH 27X BRD (SUTURE) ×2 IMPLANT
TRAY FOLEY MTR SLVR 14FR STAT (SET/KITS/TRAYS/PACK) IMPLANT
TRAY FOLEY MTR SLVR 16FR STAT (SET/KITS/TRAYS/PACK) ×1 IMPLANT
TUBE SUCTION HIGH CAP CLEAR NV (SUCTIONS) ×1 IMPLANT
WATER STERILE IRR 1000ML POUR (IV SOLUTION) ×2 IMPLANT
WRAP KNEE MAXI GEL POST OP (GAUZE/BANDAGES/DRESSINGS) ×1 IMPLANT

## 2024-01-12 NOTE — Evaluation (Signed)
 Physical Therapy Evaluation Patient Details Name: Gloria Hall MRN: [id number] DOB: 1970-11-06 Today's Date: 01/12/2024  History of Present Illness  53 yo female presents to therapy s/p R TKA on 01/12/2024 due to failure of conservative measures. Pt PMH includes but is not limited to: GAD, fibromyalgia, GERD, IBS, B UE neuropathic pain, OSA, chronic pain, L TKA on 08/04/2023.  Clinical Impression     Gloria Hall is a 53 y.o. female POD 0 s/p R TKA. Patient reports IND with mobility at baseline. Patient is now limited by functional impairments (see PT problem list below) and requires CGA and cues for transfers and gait with RW. Patient was able to ambulate 45 feet with RW and CGA and cues for safe walker management. Patient educated on safe sequencing for stair mobility with use of RW, fall risk prevention, pain management and goal, use of CP/ice and car transfers pt verbalized understanding safe guarding position for people assisting with mobility. Patient instructed in exercises to facilitate ROM and circulation reviewed and HO provided. Patient will benefit from continued skilled PT interventions to address impairments and progress towards PLOF. Patient has met mobility goals at adequate level for discharge home with family support and pt electing to perform non formal PT program; will continue to follow if pt continues acute stay to progress towards Mod I goals.        If plan is discharge home, recommend the following: A little help with walking and/or transfers;A little help with bathing/dressing/bathroom;Assistance with cooking/housework;Assist for transportation;Help with stairs or ramp for entrance   Can travel by private vehicle        Equipment Recommendations None recommended by PT  Recommendations for Other Services       Functional Status Assessment Patient has had a recent decline in their functional status and demonstrates the ability to make significant improvements in  function in a reasonable and predictable amount of time.     Precautions / Restrictions Precautions Precautions: Knee;Fall Restrictions Weight Bearing Restrictions Per Provider Order: No      Mobility  Bed Mobility Overal bed mobility: Needs Assistance Bed Mobility: Supine to Sit     Supine to sit: Supervision, HOB elevated     General bed mobility comments: min cues    Transfers Overall transfer level: Needs assistance Equipment used: Rolling walker (2 wheels) Transfers: Sit to/from Stand Sit to Stand: Contact guard assist           General transfer comment: min cues and pt reported felling dizzy in standing and no evidence of orthostatic hypotension bp 117/73 at rest semi reclined and 121/76 in standing, pt attributed the dizziness to increased R knee pain with WB from 2/10 at rest and 6/10 with WB    Ambulation/Gait Ambulation/Gait assistance: Contact guard assist Gait Distance (Feet): 45 Feet Assistive device: Rolling walker (2 wheels) Gait Pattern/deviations: Step-to pattern, Antalgic, Decreased stance time - right, Trunk flexed Gait velocity: decreased     General Gait Details: slight trunk flexion with B UE support at RW for offloading R LE in stance phase, min cues for safety and RW mangement  Stairs Stairs: Yes Stairs assistance: Contact guard assist Stair Management: Two rails Number of Stairs: 1 General stair comments: pt instruction provided on use of RW to navigate one step to enter home and pt able to perform step navigation with B handrails and min cues  Wheelchair Mobility     Tilt Bed    Modified Rankin (Stroke Patients Only)  Balance Overall balance assessment: Needs assistance Sitting-balance support: Feet supported Sitting balance-Leahy Scale: Good     Standing balance support: Bilateral upper extremity supported, During functional activity, Reliant on assistive device for balance Standing balance-Leahy Scale: Poor                                Pertinent Vitals/Pain Pain Assessment Pain Assessment: 0-10 Pain Score: 6  Pain Location: R knee and LE Pain Descriptors / Indicators: Aching, Constant, Discomfort, Grimacing, Operative site guarding Pain Intervention(s): Limited activity within patient's tolerance, Monitored during session, Premedicated before session, Repositioned, Ice applied    Home Living Family/patient expects to be discharged to:: Private residence Living Arrangements: Children Available Help at Discharge: Family Type of Home: House Home Access: Stairs to enter Entrance Stairs-Rails: None Entrance Stairs-Number of Steps: 1   Home Layout: One level Home Equipment: Rollator (4 wheels);Cane - single point      Prior Function Prior Level of Function : Independent/Modified Independent;Driving             Mobility Comments: mod I with use of SPC for all ADLs, self care tasks and IADLs       Extremity/Trunk Assessment        Lower Extremity Assessment Lower Extremity Assessment: RLE deficits/detail RLE Deficits / Details: ankle DF/PF 5/5; SLR , 10 degree lag RLE Sensation: decreased light touch    Cervical / Trunk Assessment Cervical / Trunk Assessment: Normal  Communication   Communication Communication: No apparent difficulties    Cognition Arousal: Alert Behavior During Therapy: WFL for tasks assessed/performed   PT - Cognitive impairments: No apparent impairments                         Following commands: Intact       Cueing       General Comments      Exercises Total Joint Exercises Ankle Circles/Pumps: AROM, Both, 10 reps Quad Sets: AROM, Right, 5 reps Short Arc Quad: AROM, 5 reps, Right Heel Slides: AROM, 5 reps, Right Hip ABduction/ADduction: AROM, 5 reps, Right Straight Leg Raises: AROM, Right, 5 reps Knee Flexion: AROM, Right, 5 reps, Seated   Assessment/Plan    PT Assessment Patient needs continued PT services  PT  Problem List Decreased strength;Decreased range of motion;Decreased activity tolerance;Decreased balance;Decreased mobility;Pain       PT Treatment Interventions DME instruction;Gait training;Stair training;Functional mobility training;Therapeutic activities;Therapeutic exercise;Balance training;Neuromuscular re-education;Patient/family education;Modalities    PT Goals (Current goals can be found in the Care Plan section)  Acute Rehab PT Goals Patient Stated Goal: get back to work and get the grandchildren situated PT Goal Formulation: With patient Time For Goal Achievement: 01/26/24 Potential to Achieve Goals: Good    Frequency 7X/week     Co-evaluation               AM-PAC PT "6 Clicks" Mobility  Outcome Measure Help needed turning from your back to your side while in a flat bed without using bedrails?: None Help needed moving from lying on your back to sitting on the side of a flat bed without using bedrails?: A Little Help needed moving to and from a bed to a chair (including a wheelchair)?: A Little Help needed standing up from a chair using your arms (e.g., wheelchair or bedside chair)?: A Little Help needed to walk in hospital room?: A Little Help needed climbing 3-5 steps with  a railing? : A Little 6 Click Score: 19    End of Session Equipment Utilized During Treatment: Gait belt Activity Tolerance: Patient tolerated treatment well Patient left: in chair;with call bell/phone within reach Nurse Communication: Mobility status;Other (comment) (pt readiness for d/c from PT standpoint) PT Visit Diagnosis: Unsteadiness on feet (R26.81);Other abnormalities of gait and mobility (R26.89);Muscle weakness (generalized) (M62.81);Difficulty in walking, not elsewhere classified (R26.2);Pain Pain - Right/Left: Right Pain - part of body: Knee;Leg    Time: 1222-1304 PT Time Calculation (min) (ACUTE ONLY): 42 min   Charges:   PT Evaluation $PT Eval Low Complexity: 1 Low PT  Treatments $Gait Training: 8-22 mins $Therapeutic Exercise: 8-22 mins PT General Charges $$ ACUTE PT VISIT: 1 Visit         Cary Clarks, PT Acute Rehab   Annalee Kiang 01/12/2024, 2:00 PM

## 2024-01-12 NOTE — Anesthesia Preprocedure Evaluation (Signed)
 Anesthesia Evaluation  Patient identified by MRN, date of birth, ID band Patient awake    Reviewed: Allergy & Precautions, NPO status , Patient's Chart, lab work & pertinent test results  History of Anesthesia Complications Negative for: history of anesthetic complications  Airway Mallampati: II  TM Distance: >3 FB Neck ROM: Full    Dental  (+) Missing,    Pulmonary sleep apnea , former smoker   Pulmonary exam normal        Cardiovascular hypertension, Normal cardiovascular exam  Echo 04/27/2023  1. Left ventricular ejection fraction, by estimation, is 60 to 65%. The  left ventricle has normal function. The left ventricle has no regional  wall motion abnormalities. Left ventricular diastolic parameters were  normal.   2. Right ventricular systolic function is normal. The right ventricular  size is normal.   3. The mitral valve is normal in structure. Trivial mitral valve  regurgitation. No evidence of mitral stenosis.   4. The aortic valve is tricuspid. Aortic valve regurgitation is not  visualized. No aortic stenosis is present.   5. The inferior vena cava is normal in size with greater than 50%  respiratory variability, suggesting right atrial pressure of 3 mmHg.     Neuro/Psych  Headaches  Anxiety Depression       GI/Hepatic Neg liver ROS,GERD  ,,  Endo/Other  Hypothyroidism    Renal/GU negative Renal ROS  negative genitourinary   Musculoskeletal  (+) Arthritis ,  Fibromyalgia -  Abdominal   Peds  Hematology negative hematology ROS (+)   Anesthesia Other Findings Day of surgery medications reviewed with patient.  Reproductive/Obstetrics negative OB ROS                             Anesthesia Physical Anesthesia Plan  ASA: 2  Anesthesia Plan: Spinal   Post-op Pain Management: Tylenol  PO (pre-op)* and Regional block*   Induction:   PONV Risk Score and Plan: 3 and Treatment  may vary due to age or medical condition, Ondansetron , Propofol  infusion, Dexamethasone  and Midazolam   Airway Management Planned: Natural Airway and Simple Face Mask  Additional Equipment: None  Intra-op Plan:   Post-operative Plan:   Informed Consent: I have reviewed the patients History and Physical, chart, labs and discussed the procedure including the risks, benefits and alternatives for the proposed anesthesia with the patient or authorized representative who has indicated his/her understanding and acceptance.       Plan Discussed with: CRNA  Anesthesia Plan Comments:         Anesthesia Quick Evaluation

## 2024-01-12 NOTE — Interval H&P Note (Signed)
 History and Physical Interval Note:  01/12/2024 7:22 AM  Gloria Hall  has presented today for surgery, with the diagnosis of Primary localized osteoarthrosis of the knee, right.  The various methods of treatment have been discussed with the patient and family. After consideration of risks, benefits and other options for treatment, the patient has consented to  Procedure(s): ARTHROPLASTY, KNEE, TOTAL (Right) as a surgical intervention.  The patient's history has been reviewed, patient examined, no change in status, stable for surgery.  I have reviewed the patient's chart and labs.  Questions were answered to the patient's satisfaction.     Neville Barbone

## 2024-01-12 NOTE — Anesthesia Procedure Notes (Signed)
 Spinal  Patient location during procedure: OR Start time: 01/12/2024 7:35 AM End time: 01/12/2024 7:38 AM Reason for block: surgical anesthesia Staffing Performed: anesthesiologist  Anesthesiologist: Vernadine Golas, MD Performed by: Vernadine Golas, MD Authorized by: Vernadine Golas, MD   Preanesthetic Checklist Completed: patient identified, IV checked, risks and benefits discussed, surgical consent, monitors and equipment checked, pre-op evaluation and timeout performed Spinal Block Patient position: sitting Prep: DuraPrep and site prepped and draped Patient monitoring: continuous pulse ox, blood pressure and heart rate Approach: midline Location: L3-4 Injection technique: single-shot Needle Needle type: Pencan  Needle gauge: 24 G Needle length: 9 cm Assessment Events: CSF return Additional Notes Risks, benefits, and alternative discussed. Patient gave consent to procedure. Prepped and draped in sitting position. Patient sedated but responsive to voice. Clear CSF obtained after one needle pass. Positive terminal aspiration. No pain or paraesthesias with injection. Patient tolerated procedure well. Vital signs stable. Amador Junes, MD

## 2024-01-12 NOTE — Anesthesia Postprocedure Evaluation (Signed)
 Anesthesia Post Note  Patient: Gloria Hall  Procedure(s) Performed: ARTHROPLASTY, KNEE, TOTAL (Right: Knee)     Patient location during evaluation: PACU Anesthesia Type: Spinal Level of consciousness: awake and alert Pain management: pain level controlled Vital Signs Assessment: post-procedure vital signs reviewed and stable Respiratory status: spontaneous breathing, nonlabored ventilation and respiratory function stable Cardiovascular status: blood pressure returned to baseline Postop Assessment: no apparent nausea or vomiting, spinal receding, no headache and no backache Anesthetic complications: no   No notable events documented.  Last Vitals:  Vitals:   01/12/24 1045 01/12/24 1055  BP: 90/64 107/67  Pulse: 79 70  Resp: 13 13  Temp:    SpO2: 99% 94%    Last Pain:  Vitals:   01/12/24 1055  TempSrc:   PainSc: 6                  Rayfield Cairo

## 2024-01-12 NOTE — Discharge Instructions (Signed)
 INSTRUCTIONS AFTER JOINT REPLACEMENT   Remove items at home which could result in a fall. This includes throw rugs or furniture in walking pathways ICE to the affected joint every three hours while awake for 30 minutes at a time, for at least the first 3-5 days, and then as needed for pain and swelling.  Continue to use ice for pain and swelling. You may notice swelling that will progress down to the foot and ankle.  This is normal after surgery.  Elevate your leg when you are not up walking on it.   Continue to use the breathing machine you got in the hospital (incentive spirometer) which will help keep your temperature down.  It is common for your temperature to cycle up and down following surgery, especially at night when you are not up moving around and exerting yourself.  The breathing machine keeps your lungs expanded and your temperature down.   DIET:  As you were doing prior to hospitalization, we recommend a well-balanced diet.  DRESSING / WOUND CARE / SHOWERING  You may shower 3 days after surgery, but keep the wounds dry during showering.  You may use an occlusive plastic wrap (Press'n Seal for example), NO SOAKING/SUBMERGING IN THE BATHTUB.  If the bandage gets wet, change with a clean dry gauze.  If the incision gets wet, pat the wound dry with a clean towel. You have an ace wrap around your leg with 2 white abd pads underneath, you may remove these 24 hours after surgery. From then on, please use your compression stockings during the day on both legs (you may take them off at night) until your follow up visit.   ACTIVITY  Increase activity slowly as tolerated, but follow the weight bearing instructions below.   No driving for 6 weeks or until further direction given by your physician.  You cannot drive while taking narcotics.  No lifting or carrying greater than 10 lbs. until further directed by your surgeon. Avoid periods of inactivity such as sitting longer than an hour when not  asleep. This helps prevent blood clots.  You may return to work once you are authorized by your doctor.     WEIGHT BEARING   Weight bearing as tolerated with assist device (walker, cane, etc) as directed, use it as long as suggested by your surgeon or therapist, typically at least 4-6 weeks.   EXERCISES  Results after joint replacement surgery are often greatly improved when you follow the exercise, range of motion and muscle strengthening exercises prescribed by your doctor. Safety measures are also important to protect the joint from further injury. Any time any of these exercises cause you to have increased pain or swelling, decrease what you are doing until you are comfortable again and then slowly increase them. If you have problems or questions, call your caregiver or physical therapist for advice.   Rehabilitation is important following a joint replacement. After just a few days of immobilization, the muscles of the leg can become weakened and shrink (atrophy).  These exercises are designed to build up the tone and strength of the thigh and leg muscles and to improve motion. Often times heat used for twenty to thirty minutes before working out will loosen up your tissues and help with improving the range of motion but do not use heat for the first two weeks following surgery (sometimes heat can increase post-operative swelling).   These exercises can be done on a training (exercise) mat, on the floor, on  a table or on a bed. Use whatever works the best and is most comfortable for you.    Use music or television while you are exercising so that the exercises are a pleasant break in your day. This will make your life better with the exercises acting as a break in your routine that you can look forward to.   Perform all exercises about fifteen times, three times per day or as directed.  You should exercise both the operative leg and the other leg as well.  Exercises include:   Quad Sets -  Tighten up the muscle on the front of the thigh (Quad) and hold for 5-10 seconds.   Straight Leg Raises - With your knee straight (if you were given a brace, keep it on), lift the leg to 60 degrees, hold for 3 seconds, and slowly lower the leg.  Perform this exercise against resistance later as your leg gets stronger.  Leg Slides: Lying on your back, slowly slide your foot toward your buttocks, bending your knee up off the floor (only go as far as is comfortable). Then slowly slide your foot back down until your leg is flat on the floor again.  Angel Wings: Lying on your back spread your legs to the side as far apart as you can without causing discomfort.  Hamstring Strength:  Lying on your back, push your heel against the floor with your leg straight by tightening up the muscles of your buttocks.  Repeat, but this time bend your knee to a comfortable angle, and push your heel against the floor.  You may put a pillow under the heel to make it more comfortable if necessary.   A rehabilitation program following joint replacement surgery can speed recovery and prevent re-injury in the future due to weakened muscles. Contact your doctor or a physical therapist for more information on knee rehabilitation.    CONSTIPATION  Constipation is defined medically as fewer than three stools per week and severe constipation as less than one stool per week.  Even if you have a regular bowel pattern at home, your normal regimen is likely to be disrupted due to multiple reasons following surgery.  Combination of anesthesia, postoperative narcotics, change in appetite and fluid intake all can affect your bowels.   YOU MUST use at least one of the following options; they are listed in order of increasing strength to get the job done.  They are all available over the counter, and you may need to use some, POSSIBLY even all of these options:    Drink plenty of fluids (prune juice may be helpful) and high fiber  foods Colace 100 mg by mouth twice a day  Senokot for constipation as directed and as needed Dulcolax (bisacodyl), take with full glass of water   Miralax (polyethylene glycol) once or twice a day as needed.  If you have tried all these things and are unable to have a bowel movement in the first 3-4 days after surgery call either your surgeon or your primary doctor.    If you experience loose stools or diarrhea, hold the medications until you stool forms back up.  If your symptoms do not get better within 1 week or if they get worse, check with your doctor.  If you experience "the worst abdominal pain ever" or develop nausea or vomiting, please contact the office immediately for further recommendations for treatment.   ITCHING:  If you experience itching with your medications, try taking only a single  pain pill, or even half a pain pill at a time.  You can also use Benadryl  over the counter for itching or also to help with sleep.   TED HOSE STOCKINGS:  Use stockings on both legs until for at least 2 weeks or as directed by physician office. They may be removed at night for sleeping.  MEDICATIONS:  See your medication summary on the "After Visit Summary" that nursing will review with you.  You may have some home medications which will be placed on hold until you complete the course of blood thinner medication.  It is important for you to complete the blood thinner medication as prescribed.  PRECAUTIONS:  If you experience chest pain or shortness of breath - call 911 immediately for transfer to the hospital emergency department.   If you develop a fever greater that 101 F, purulent drainage from wound, increased redness or drainage from wound, foul odor from the wound/dressing, or calf pain - CONTACT YOUR SURGEON.                                                   FOLLOW-UP APPOINTMENTS:  If you do not already have a post-op appointment, please call the office for an appointment to be seen by your  surgeon.  Guidelines for how soon to be seen are listed in your "After Visit Summary", but are typically between 1-4 weeks after surgery.  OTHER INSTRUCTIONS:   Knee Replacement:  Do not place pillow under knee, focus on keeping the knee straight while resting.    POST-OPERATIVE OPIOID TAPER INSTRUCTIONS: It is important to wean off of your opioid medication as soon as possible. If you do not need pain medication after your surgery it is ok to stop day one. Opioids include: Codeine, Hydrocodone (Norco, Vicodin), Oxycodone (Percocet, oxycontin ) and hydromorphone  amongst others.  Long term and even short term use of opiods can cause: Increased pain response Dependence Constipation Depression Respiratory depression And more.  Withdrawal symptoms can include Flu like symptoms Nausea, vomiting And more Techniques to manage these symptoms Hydrate well Eat regular healthy meals Stay active Use relaxation techniques(deep breathing, meditating, yoga) Do Not substitute Alcohol to help with tapering If you have been on opioids for less than two weeks and do not have pain than it is ok to stop all together.  Plan to wean off of opioids This plan should start within one week post op of your joint replacement. Maintain the same interval or time between taking each dose and first decrease the dose.  Cut the total daily intake of opioids by one tablet each day Next start to increase the time between doses. The last dose that should be eliminated is the evening dose.   MAKE SURE YOU:  Understand these instructions.  Get help right away if you are not doing well or get worse.    Thank you for letting us  be a part of your medical care team.  It is a privilege we respect greatly.  We hope these instructions will help you stay on track for a fast and full recovery!

## 2024-01-12 NOTE — Anesthesia Procedure Notes (Signed)
 Anesthesia Regional Block: Adductor canal block   Pre-Anesthetic Checklist: , timeout performed,  Correct Patient, Correct Site, Correct Laterality,  Correct Procedure, Correct Position, site marked,  Risks and benefits discussed,  Pre-op evaluation,  At surgeon's request and post-op pain management  Laterality: Right  Prep: Maximum Sterile Barrier Precautions used, chloraprep       Needles:  Injection technique: Single-shot  Needle Type: Echogenic Stimulator Needle     Needle Length: 9cm  Needle Gauge: 22     Additional Needles:   Procedures:,,,, ultrasound used (permanent image in chart),,    Narrative:  Start time: 01/12/2024 7:06 AM End time: 01/12/2024 7:09 AM Injection made incrementally with aspirations every 5 mL.  Performed by: Personally  Anesthesiologist: Vernadine Golas, MD  Additional Notes: Risks, benefits, and alternative discussed. Patient gave consent for procedure. Patient prepped and draped in sterile fashion. Sedation administered, patient remains easily responsive to voice. Relevant anatomy identified with ultrasound guidance. Local anesthetic given in 5cc increments with no signs or symptoms of intravascular injection. No pain or paraesthesias with injection. Patient monitored throughout procedure with signs of LAST or immediate complications. Tolerated well. Ultrasound image placed in chart.  Amador Junes, MD

## 2024-01-12 NOTE — Op Note (Signed)
 DATE OF SURGERY:  01/12/2024 TIME: 9:14 AM  PATIENT NAME:  Gloria Hall   AGE: 53 y.o.    PRE-OPERATIVE DIAGNOSIS: Right knee primary localized osteoarthritis  POST-OPERATIVE DIAGNOSIS:  Same  PROCEDURE: RIGHT total Knee Arthroplasty  SURGEON:  Neville Barbone, MD   ASSISTANT:  Hurshel Maidens, PA-C, present and scrubbed throughout the case, critical for assistance with exposure, retraction, instrumentation, and closure.  OPERATIVE IMPLANTS: Implant Name: CEMENT HV SMART SET - [id number] Type: Cement Inv. Item: CEMENT HV SMART SET Serial No.:  Manufacturer: [employer] Lot No.: [id number] LRB: Right No. Used: 2 Action: Implanted   Implant Name: INSERT TIB ATTUNE FB [id number] - [id number] Type: Insert Inv. Item: INSERT TIB ATTUNE FB [id number] Serial No.:  Manufacturer: [employer] Lot No.: [id number] LRB: Right No. Used: 1 Action: Implanted   Implant Name: PATELLA MEDIAL ATTUN KNEE - [id number] Type: Knees Inv. Item: PATELLA MEDIAL ATTUN KNEE Serial No.:  Manufacturer: [employer] Lot No.: [id number] LRB: Right No. Used: 1 Action: Implanted   Implant Name: ATTUNE PS FEM RT SZ 4 CEM KNEE - [id number] Type: Femur Inv. Item: ATTUNE PS FEM RT SZ 4 CEM KNEE Serial No.:  Manufacturer: [employer] Lot No.: [id number] LRB: Right No. Used: 1 Action: Implanted   Implant Name: BASEPLATE TIB CMT FB PCKT SZ4 - [id number] Type: Stem Inv. Item: BASEPLATE TIB CMT FB PCKT SZ4 Serial No.:  Manufacturer: [employer] Lot No.: [id number] LRB: Right No. Used: 1 Action: Implanted   PREOPERATIVE INDICATIONS:  Gloria Hall is a 53 y.o. year old female with end stage bone on bone degenerative arthritis of the knee who failed conservative treatment, including injections, antiinflammatories, activity modification, and assistive devices, and had significant impairment of their activities of daily living, and elected for Total Knee Arthroplasty.    The risks, benefits, and alternatives were discussed at length including but not limited to the risks of infection, bleeding, nerve injury, stiffness, blood clots, the need for revision surgery, cardiopulmonary complications, among others, and they were willing to proceed.  OPERATIVE FINDINGS AND UNIQUE ASPECTS OF THE CASE: I cut the femur twice in order to get adequate gap, and 6 felt better restoring stability in flexion.  Bone quality was mediocre at best.  ESTIMATED BLOOD LOSS: 100 mL  OPERATIVE DESCRIPTION:  The patient was brought to the operative room and placed in a supine position.  Anesthesia was administered.  IV antibiotics were given.  The lower extremity was prepped and draped in the usual sterile fashion.  Time out was performed.  The leg was elevated and exsanguinated and the tourniquet was inflated.  Anterior quadriceps tendon splitting approach was performed.  The patella was everted and osteophytes were removed.  The anterior horn of the medial and lateral meniscus was removed.   The patella was then measured, and cut with the saw.  The thickness before the cut was 21.5 and after the cut was 13.5.  A metal shield was used to protect the patella throughout the case.    The distal femur was opened with the drill and the intramedullary distal femoral cutting jig was utilized, set at 5 degrees resecting 9 mm off the distal femur.  Care was taken to protect the collateral ligaments.  Then the extramedullary tibial cutting jig was utilized making the appropriate cut using the anterior tibial crest as a reference building in appropriate posterior slope.  Care was taken during the cut to protect the medial and collateral ligaments.  The proximal tibia was removed along with the posterior horns of the menisci.  The PCL was sacrificed.    The extensor gap was measured and found to have adequate resection, measuring to a size 6 after a second resection of the femur.    The distal  femoral sizing jig was applied, taking care to avoid notching.  This was set at 3 degrees of external rotation.  Then the 4-in-1 cutting jig was applied and the anterior and posterior femur was cut, along with the chamfer cuts.  All posterior osteophytes were removed.  The flexion gap was then measured and was symmetric with the extension gap.  I completed the distal femoral preparation using the appropriate jig to prepare the box.  The proximal tibia sized and prepared accordingly with the reamer and the punch, and then all components were trialed with the poly insert.  The knee was found to have excellent balance and full motion.    The above named components were then cemented into place and all excess cement was removed.  The real polyethylene implant was placed.  After the cement had cured I released the tourniquet and confirmed excellent hemostasis with no major posterior vessel injury.    The knee was easily taken through a range of motion and the patella tracked well and the knee irrigated copiously and the parapatellar tissue closed with Stratafix and vicryl, and subcutaneous tissue closed with vicryl, and monocryl with steri strips for the skin.  The wounds were injected with marcaine , and dressed with sterile gauze and the patient was awakened and returned to the PACU in stable and satisfactory condition.  There were no complications.  Total tourniquet time was approximately 70 minutes.

## 2024-01-12 NOTE — Anesthesia Procedure Notes (Signed)
 Procedure Name: MAC Date/Time: 01/12/2024 7:34 AM  Performed by: Manuela Sella, CRNAPre-anesthesia Checklist: Patient identified, Emergency Drugs available, Suction available and Patient being monitored Patient Re-evaluated:Patient Re-evaluated prior to induction Oxygen Delivery Method: Simple face mask Ventilation: Mask ventilation without difficulty Placement Confirmation: positive ETCO2 and breath sounds checked- equal and bilateral

## 2024-01-12 NOTE — Transfer of Care (Signed)
 Immediate Anesthesia Transfer of Care Note  Patient: Gloria Hall  Procedure(s) Performed: Procedure(s): ARTHROPLASTY, KNEE, TOTAL (Right)  Patient Location: PACU  Anesthesia Type:MAC, Regional, and Spinal  Level of Consciousness: Patient easily awoken, comfortable, cooperative, following commands, responds to stimulation.   Airway & Oxygen Therapy: Patient spontaneously breathing, ventilating well, oxygen via simple oxygen mask.  Post-op Assessment: Report given to PACU RN, vital signs reviewed and stable, moving all extremities.   Post vital signs: Reviewed and stable.  Complications: No apparent anesthesia complications  Last Vitals:  Vitals Value Taken Time  BP 110/70 01/12/24 1007  Temp    Pulse 83 01/12/24 1009  Resp 13 01/12/24 1009  SpO2 99 % 01/12/24 1009  Vitals shown include unfiled device data.  Last Pain:  Vitals:   01/12/24 0534  TempSrc: Oral  PainSc: 6       Patients Stated Pain Goal: 4 (01/12/24 0534)  Complications: No notable events documented.

## 2024-01-13 ENCOUNTER — Encounter (HOSPITAL_COMMUNITY): Payer: Self-pay | Admitting: Orthopedic Surgery

## 2024-07-11 ENCOUNTER — Other Ambulatory Visit: Payer: Self-pay | Admitting: Internal Medicine

## 2024-07-26 ENCOUNTER — Other Ambulatory Visit: Payer: Self-pay | Admitting: Internal Medicine

## 2024-07-27 MED ORDER — EZETIMIBE 10 MG PO TABS: 10.0000 mg | ORAL_TABLET | Freq: Every day | ORAL | 0 refills | Status: AC

## 2024-09-21 ENCOUNTER — Ambulatory Visit: Admitting: Internal Medicine
